# Patient Record
Sex: Female | Born: 1965 | Race: Black or African American | Hispanic: No | Marital: Married | Smoking: Current every day smoker
Health system: Southern US, Community
[De-identification: ages and names within clinical notes are randomized; demographics above are authoritative.]

## PROBLEM LIST (undated history)

## (undated) DIAGNOSIS — K219 Gastro-esophageal reflux disease without esophagitis: Secondary | ICD-10-CM

## (undated) DIAGNOSIS — R87629 Unspecified abnormal cytological findings in specimens from vagina: Secondary | ICD-10-CM

## (undated) DIAGNOSIS — I10 Essential (primary) hypertension: Secondary | ICD-10-CM

## (undated) HISTORY — PX: TONSILLECTOMY: SUR1361

## (undated) HISTORY — DX: Gastro-esophageal reflux disease without esophagitis: K21.9

## (undated) HISTORY — DX: Unspecified abnormal cytological findings in specimens from vagina: R87.629

## (undated) HISTORY — PX: EYE SURGERY: SHX253

---

## 2002-03-30 ENCOUNTER — Encounter: Payer: Self-pay | Admitting: Emergency Medicine

## 2002-03-30 ENCOUNTER — Emergency Department (HOSPITAL_COMMUNITY): Admission: EM | Admit: 2002-03-30 | Discharge: 2002-03-30 | Payer: Self-pay | Admitting: Emergency Medicine

## 2002-07-13 ENCOUNTER — Emergency Department (HOSPITAL_COMMUNITY): Admission: EM | Admit: 2002-07-13 | Discharge: 2002-07-13 | Payer: Self-pay | Admitting: Emergency Medicine

## 2002-07-13 ENCOUNTER — Encounter: Payer: Self-pay | Admitting: Emergency Medicine

## 2002-08-04 ENCOUNTER — Other Ambulatory Visit: Admission: RE | Admit: 2002-08-04 | Discharge: 2002-08-04 | Payer: Self-pay | Admitting: Family Medicine

## 2004-09-22 ENCOUNTER — Emergency Department (HOSPITAL_COMMUNITY): Admission: EM | Admit: 2004-09-22 | Discharge: 2004-09-22 | Payer: Self-pay | Admitting: Emergency Medicine

## 2004-09-30 ENCOUNTER — Emergency Department (HOSPITAL_COMMUNITY): Admission: EM | Admit: 2004-09-30 | Discharge: 2004-09-30 | Payer: Self-pay

## 2006-11-09 ENCOUNTER — Emergency Department (HOSPITAL_COMMUNITY): Admission: EM | Admit: 2006-11-09 | Discharge: 2006-11-09 | Payer: Self-pay | Admitting: Emergency Medicine

## 2006-11-14 ENCOUNTER — Ambulatory Visit (HOSPITAL_COMMUNITY): Admission: RE | Admit: 2006-11-14 | Discharge: 2006-11-14 | Payer: Self-pay | Admitting: Otolaryngology

## 2010-04-01 ENCOUNTER — Encounter: Admission: RE | Admit: 2010-04-01 | Discharge: 2010-04-01 | Payer: Self-pay | Admitting: Occupational Medicine

## 2010-06-15 ENCOUNTER — Emergency Department (HOSPITAL_COMMUNITY)
Admission: EM | Admit: 2010-06-15 | Discharge: 2010-06-15 | Payer: Self-pay | Source: Home / Self Care | Admitting: Emergency Medicine

## 2011-01-13 NOTE — Op Note (Signed)
[name], [name]            ACCOUNT NO.:  [id number]   MEDICAL RECORD NO.:  [id number]          PATIENT TYPE:  AMB   LOCATION:  SDS                          FACILITY:  [facility]   PHYSICIAN:  Suzanna Obey, M.D.       DATE OF BIRTH:  10/15/1965   DATE OF PROCEDURE:  11/14/2006  DATE OF DISCHARGE:  11/14/2006                               OPERATIVE REPORT   PREOPERATIVE DIAGNOSIS:  Left orbital floor fracture.   POSTOPERATIVE DIAGNOSIS:  Left orbital floor fracture.   PROCEDURE:  Open reduction internal fixation of left orbital floor  fracture.   ANESTHESIA:  General.   ESTIMATED BLOOD LOSS:  Approximately 5 mL.   INDICATIONS:  This is a 45 year old who injured her eye about 1 week ago  and sustained an orbital floor fracture based on CT scan.  She saw the  ophthalmologist yesterday and there was entrapment and diplopia issues.  She has no new vision changes.  She was informed of the risks and  benefits of the procedure including bleeding, infection, blindness,  scarring of the eye and conjunctiva, persistent diplopia revision  orbital floor necessary, chronic sinusitis and persistent numbness and  risk of the anesthetic.  All questions were answered and consent was  obtained.   DESCRIPTION OF PROCEDURE:  The patient was taken to the operating room  and placed in the supine position.  After adequate general endotracheal  tube anesthesia, was prepped and draped in the usual standard manner.  The lateral canthal area was injected with 1% lidocaine with 1:100,000  epinephrine as well as along the infraorbital rim. An incision was made  just lateral to the lateral canthus with a 15 blade and was dissected  down to the orbital rim.  The lateral canthus was divided with sharp  division and the conjunctiva was dissected so the orbital fat was kept  up into the orbital along the periorbita and the conjunctiva was cut  exposing the infraorbital rim.  This flap was laid back down and  eyelid  retractor was used to gain exposure.  The Glorious Peach was used to dissect into  the orbit and immediately it could be identified.  A significant amount  of fat down into the maxillary sinus that was trapped.  This was brought  back up with the [facility] elevator carefully, slowly dissecting the fat  back up into the orbit.  The bone that was displaced could be seen and  it was brought back into its anatomical position.  There was nice bone  on each side of this orbital floor fracture and the dissection was only  carried back to the level of where the fat seemed to be entrapped.  This  was approximately half-way back in the orbit.  There was a well-defined  hole so the dissolvable mesh was used to position it to the orbit to  cover over the blowout.  This fit very nicely and was confirmed to the  orbital shape.  The fat was laid back down on top of this and the eye  was repositioned.  A corneal protector had been placed prior to  the  onset of the case, and it was removed.  The periosteum was  reapproximated and secured with 4-0 chromic.  The lateral canthus was  repositioned using a 5-0 nylon suture and made careful that it was  precisely  reapproximated.  The skin incision was closed with interrupted 4-0  chromic and an interrupted 4-0 nylon.  Bacitracin ointment placed in the  eye and washed with balanced salt solution.  The patient was awakened,  brought to the recovery room in stable condition.  Counts were correct.           ______________________________  Suzanna Obey, M.D.     JB/MEDQ  D:  11/14/2006  T:  11/15/2006  Job:  [id number]

## 2011-01-31 ENCOUNTER — Emergency Department (HOSPITAL_COMMUNITY)
Admission: EM | Admit: 2011-01-31 | Discharge: 2011-01-31 | Disposition: A | Discharge status: Home / Self Care | Payer: Self-pay | Attending: Emergency Medicine | Admitting: Emergency Medicine

## 2011-01-31 DIAGNOSIS — L259 Unspecified contact dermatitis, unspecified cause: Secondary | ICD-10-CM | POA: Insufficient documentation

## 2011-04-02 ENCOUNTER — Encounter: Payer: Self-pay | Admitting: Emergency Medicine

## 2011-04-02 ENCOUNTER — Emergency Department (HOSPITAL_COMMUNITY)
Admission: EM | Admit: 2011-04-02 | Discharge: 2011-04-02 | Disposition: A | Discharge status: Home / Self Care | Payer: Self-pay | Attending: Emergency Medicine | Admitting: Emergency Medicine

## 2011-04-02 DIAGNOSIS — T6391XA Toxic effect of contact with unspecified venomous animal, accidental (unintentional), initial encounter: Secondary | ICD-10-CM | POA: Insufficient documentation

## 2011-04-02 DIAGNOSIS — L02419 Cutaneous abscess of limb, unspecified: Secondary | ICD-10-CM | POA: Insufficient documentation

## 2011-04-02 DIAGNOSIS — T63391A Toxic effect of venom of other spider, accidental (unintentional), initial encounter: Secondary | ICD-10-CM | POA: Insufficient documentation

## 2011-04-02 DIAGNOSIS — L259 Unspecified contact dermatitis, unspecified cause: Secondary | ICD-10-CM | POA: Insufficient documentation

## 2011-04-02 DIAGNOSIS — W57XXXA Bitten or stung by nonvenomous insect and other nonvenomous arthropods, initial encounter: Secondary | ICD-10-CM

## 2011-04-02 DIAGNOSIS — L03119 Cellulitis of unspecified part of limb: Secondary | ICD-10-CM | POA: Insufficient documentation

## 2011-04-02 MED ORDER — DOXYCYCLINE HYCLATE 100 MG PO TABS
100.0000 mg | ORAL_TABLET | Freq: Once | ORAL | Status: AC
Start: 2011-04-02 — End: 2011-04-02
  Administered 2011-04-02: 100 mg via ORAL

## 2011-04-02 MED ORDER — BACITRACIN-NEOMYCIN-POLYMYXIN 400-5-5000 EX OINT
TOPICAL_OINTMENT | Freq: Once | CUTANEOUS | Status: AC
  Administered 2011-04-02: 1 via TOPICAL

## 2011-04-02 MED ORDER — DOXYCYCLINE HYCLATE 100 MG PO TABS: 100.0000 mg | ORAL_TABLET | Freq: Once | ORAL | Status: DC

## 2011-04-02 MED ORDER — BACITRACIN-NEOMYCIN-POLYMYXIN 400-5-5000 EX OINT
TOPICAL_OINTMENT | CUTANEOUS | Status: AC
  Filled 2011-04-02: qty 1

## 2011-04-02 MED ORDER — TRIAMCINOLONE ACETONIDE 0.1 % EX CREA: TOPICAL_CREAM | Freq: Two times a day (BID) | CUTANEOUS | Status: AC

## 2011-04-02 MED ORDER — DOXYCYCLINE HYCLATE 100 MG PO CAPS: 100.0000 mg | ORAL_CAPSULE | Freq: Two times a day (BID) | ORAL | Status: AC

## 2011-04-02 MED ORDER — DOXYCYCLINE HYCLATE 100 MG PO TABS
ORAL_TABLET | ORAL | Status: AC
  Filled 2011-04-02: qty 1

## 2011-04-02 NOTE — ED Notes (Signed)
Pt c/o  "spider bite" to r upper thigh. Round abscess noted.

## 2011-04-02 NOTE — ED Provider Notes (Addendum)
History     CSN: [id number] Arrival date & time: 04/02/2011  8:01 AM  Chief Complaint  Patient presents with  . Abscess   The history is provided by the patient (pt put on a pair of pants last PM and states "it felt like something bit me"). No language interpreter was used.    History reviewed. No pertinent past medical history.  Past Surgical History  Procedure Date  . Eye surgery     Family History  Problem Relation Age of Onset  . Hypertension Mother     History  Substance Use Topics  . Smoking status: Current Everyday Smoker -- 0.5 packs/day  . Smokeless tobacco: Not on file  . Alcohol Use: Yes     occassional    OB History    Grav Para Term Preterm Abortions TAB SAB Ect Mult Living   1 1        1       Review of Systems  Skin:       ? Insect bite  All other systems reviewed and are negative.    Physical Exam  BP 144/112  Pulse 78  Temp 98.3 F (36.8 C)  Resp 20  Ht 5\' 3"  (1.6 m)  Wt 135 lb (61.236 kg)  BMI 23.91 kg/m2  SpO2 100%  LMP 04/02/2011  Physical Exam  Nursing note and vitals reviewed. Constitutional: She is oriented to person, place, and time. Vital signs are normal. She appears well-developed and well-nourished. No distress.  HENT:  Head: Normocephalic and atraumatic.  Right Ear: External ear normal.  Left Ear: External ear normal.  Nose: Nose normal.  Mouth/Throat: No oropharyngeal exudate.  Eyes: Conjunctivae and EOM are normal. Pupils are equal, round, and reactive to light. Right eye exhibits no discharge. Left eye exhibits no discharge. No scleral icterus.  Neck: Normal range of motion. Neck supple. No JVD present. No tracheal deviation present. No thyromegaly present.  Cardiovascular: Normal rate, regular rhythm, normal heart sounds, intact distal pulses and normal pulses.  Exam reveals no gallop and no friction rub.   No murmur heard. Pulmonary/Chest: Effort normal and breath sounds normal. No stridor. No respiratory distress.  She has no wheezes. She has no rales. She exhibits no tenderness.  Abdominal: Soft. Normal appearance and bowel sounds are normal. She exhibits no distension and no mass. There is no tenderness. There is no rebound and no guarding.  Musculoskeletal: Normal range of motion. She exhibits no edema and no tenderness.  Lymphadenopathy:    She has no cervical adenopathy.  Neurological: She is alert and oriented to person, place, and time. She has normal reflexes. No cranial nerve deficit. Coordination normal. GCS eye subscore is 4. GCS verbal subscore is 5. GCS motor subscore is 6.  Reflex Scores:      Tricep reflexes are 2+ on the right side and 2+ on the left side.      Bicep reflexes are 2+ on the right side and 2+ on the left side.      Brachioradialis reflexes are 2+ on the right side and 2+ on the left side.      Patellar reflexes are 2+ on the right side and 2+ on the left side.      Achilles reflexes are 2+ on the right side and 2+ on the left side. Skin: Skin is warm and dry. No rash noted. She is not diaphoretic.     Psychiatric: She has a normal mood and affect. Her speech is normal  and behavior is normal. Judgment and thought content normal. Cognition and memory are normal.    ED Course  INCISION AND DRAINAGE Date/Time: 04/02/2011 8:56 AM Performed by: Worthy Rancher Authorized by: Pollyann Savoy Consent: Verbal consent obtained. Consent given by: patient Patient understanding: patient states understanding of the procedure being performed Patient identity confirmed: verbally with patient Type: bulla Body area: lower extremity Anesthesia method: none. Patient sedated: no Incision type: single straight Complexity: simple Drainage: serosanguinous Drainage amount: scant Wound treatment: wound left open Patient tolerance: Patient tolerated the procedure well with no immediate complications.    MDM       Worthy Rancher, PA 04/02/11 0830  Worthy Rancher,  PA 04/02/11 0840  Worthy Rancher, PA 04/02/11 1610  Worthy Rancher, PA 04/02/11 0845  Worthy Rancher, PA 04/02/11 9604  Worthy Rancher, PA 06/19/11 [phone]

## 2011-04-02 NOTE — ED Provider Notes (Signed)
Medical screening examination/treatment/procedure(s) were performed by non-physician practitioner and as supervising physician I was immediately available for consultation/collaboration.   Tamryn Popko B. Bernette Mayers, MD 04/02/11 [phone]

## 2011-04-02 NOTE — ED Provider Notes (Signed)
Medical screening examination/treatment/procedure(s) were performed by non-physician practitioner and as supervising physician I was immediately available for consultation/collaboration.   Valeriano Bain B. Bernette Mayers, MD 04/02/11 [phone]

## 2011-06-15 ENCOUNTER — Encounter (HOSPITAL_COMMUNITY): Payer: Self-pay | Admitting: Emergency Medicine

## 2011-06-15 ENCOUNTER — Emergency Department (HOSPITAL_COMMUNITY)
Admission: EM | Admit: 2011-06-15 | Discharge: 2011-06-15 | Disposition: A | Discharge status: Home / Self Care | Payer: Self-pay | Attending: Emergency Medicine | Admitting: Emergency Medicine

## 2011-06-15 DIAGNOSIS — F17200 Nicotine dependence, unspecified, uncomplicated: Secondary | ICD-10-CM | POA: Insufficient documentation

## 2011-06-15 DIAGNOSIS — J40 Bronchitis, not specified as acute or chronic: Secondary | ICD-10-CM | POA: Insufficient documentation

## 2011-06-15 MED ORDER — DOXYCYCLINE HYCLATE 100 MG PO CAPS: 100.0000 mg | ORAL_CAPSULE | Freq: Two times a day (BID) | ORAL | Status: AC

## 2011-06-15 MED ORDER — PROMETHAZINE-DM 6.25-15 MG/5ML PO SYRP: ORAL_SOLUTION | ORAL | Status: DC

## 2011-06-15 MED ORDER — FEXOFENADINE-PSEUDOEPHED ER 60-120 MG PO TB12: 1.0000 | ORAL_TABLET | Freq: Two times a day (BID) | ORAL | Status: DC

## 2011-06-15 MED ORDER — PREDNISONE 10 MG PO TABS: ORAL_TABLET | ORAL | Status: DC

## 2011-06-15 NOTE — ED Provider Notes (Addendum)
History     CSN: [id number] Arrival date & time: 06/15/2011 11:51 AM   None     Chief Complaint  Patient presents with  . Cough    (Consider location/radiation/quality/duration/timing/severity/associated sxs/prior treatment) Patient is a 45 y.o. female presenting with cough. The history is provided by the patient.  Cough This is a new problem. The current episode started more than 1 week ago. The problem has been gradually worsening. The cough is non-productive. There has been no fever. Associated symptoms include chest pain, sweats, headaches, rhinorrhea, sore throat, shortness of breath and wheezing. Pertinent negatives include no myalgias.    History reviewed. No pertinent past medical history.  Past Surgical History  Procedure Date  . Eye surgery     Family History  Problem Relation Age of Onset  . Hypertension Mother     History  Substance Use Topics  . Smoking status: Current Everyday Smoker -- 0.5 packs/day    Types: Cigarettes  . Smokeless tobacco: Not on file  . Alcohol Use: Yes     occassional    OB History    Grav Para Term Preterm Abortions TAB SAB Ect Mult Living   1 1        1       Review of Systems  Constitutional: Negative for activity change.       All ROS Neg except as noted in HPI  HENT: Positive for sore throat and rhinorrhea. Negative for nosebleeds and neck pain.   Eyes: Negative for photophobia and discharge.  Respiratory: Positive for cough, shortness of breath and wheezing.   Cardiovascular: Positive for chest pain. Negative for palpitations.  Gastrointestinal: Negative for abdominal pain and blood in stool.  Genitourinary: Negative for dysuria, frequency and hematuria.  Musculoskeletal: Negative for myalgias, back pain and arthralgias.  Skin: Negative.   Neurological: Positive for headaches. Negative for dizziness, seizures and speech difficulty.  Psychiatric/Behavioral: Negative for hallucinations and confusion.    Allergies    Review of patient's allergies indicates no known allergies.  Home Medications   Current Outpatient Rx  Name Route Sig Dispense Refill  . HYDROXYZINE PAMOATE 25 MG PO CAPS Oral Take 25 mg by mouth 3 (three) times daily as needed.      . TRIAMCINOLONE ACETONIDE 0.1 % EX CREA Topical Apply topically 2 (two) times daily. 30 g 0    BP 187/116  Pulse 91  Temp(Src) 98.6 F (37 C) (Oral)  Resp 14  Ht 5\' 3"  (1.6 m)  Wt 120 lb (54.432 kg)  BMI 21.26 kg/m2  SpO2 100%  LMP 05/30/2011  Physical Exam  Nursing note and vitals reviewed. Constitutional: She is oriented to person, place, and time. She appears well-developed and well-nourished.  Non-toxic appearance.  HENT:  Head: Normocephalic.  Right Ear: Tympanic membrane and external ear normal.  Left Ear: Tympanic membrane and external ear normal.       Nasal congestion noted.  Eyes: EOM and lids are normal. Pupils are equal, round, and reactive to light.  Neck: Normal range of motion. Neck supple. Carotid bruit is not present.  Cardiovascular: Normal rate, regular rhythm, normal heart sounds, intact distal pulses and normal pulses.   Pulmonary/Chest: No respiratory distress. She has wheezes. She has rhonchi.  Abdominal: Soft. Bowel sounds are normal. There is no tenderness. There is no guarding.  Musculoskeletal: Normal range of motion.  Lymphadenopathy:       Head (right side): No submandibular adenopathy present.       Head (left  side): No submandibular adenopathy present.    She has no cervical adenopathy.  Neurological: She is alert and oriented to person, place, and time. She has normal strength. No cranial nerve deficit or sensory deficit.  Skin: Skin is warm and dry.  Psychiatric: She has a normal mood and affect. Her speech is normal.    ED Course  Procedures (including critical care time)  Labs Reviewed - No data to display No results found.   Dx: bronchitis   MDM  I have reviewed nursing notes, vital signs, and  all appropriate lab and imaging results for this patient.        Kathie Dike, PA 06/29/11 2123  Kathie Dike, PA 07/07/11 [phone]

## 2011-06-15 NOTE — ED Notes (Signed)
Pt c/o cough for 3 weeks.

## 2011-06-21 NOTE — ED Provider Notes (Signed)
Medical screening examination/treatment/procedure(s) were performed by non-physician practitioner and as supervising physician I was immediately available for consultation/collaboration.   Jamey Harman B. Mcarthur Ivins, MD 06/21/11 0836 

## 2011-06-21 NOTE — ED Provider Notes (Signed)
  Physical Exam  BP 187/116  Pulse 91  Temp(Src) 98.6 F (37 C) (Oral)  Resp 14  Ht 5\' 3"  (1.6 m)  Wt 120 lb (54.432 kg)  BMI 21.26 kg/m2  SpO2 100%  LMP 05/30/2011  Physical Exam  ED Course  Procedures  MDM Medical screening examination/treatment/procedure(s) were conducted as a shared visit with non-physician practitioner(s) and myself.  I personally evaluated the patient during the encounter      Raeford Razor, MD 06/21/11 [phone]

## 2011-07-03 NOTE — ED Provider Notes (Signed)
Medical screening examination/treatment/procedure(s) were performed by non-physician practitioner and as supervising physician I was immediately available for consultation/collaboration.  Tariah Transue, MD 07/03/11 0014 

## 2011-07-07 NOTE — ED Provider Notes (Signed)
Medical screening examination/treatment/procedure(s) were performed by non-physician practitioner and as supervising physician I was immediately available for consultation/collaboration.  Raeford Razor, MD 07/07/11 [phone]

## 2012-05-06 ENCOUNTER — Emergency Department (HOSPITAL_COMMUNITY)
Admission: EM | Admit: 2012-05-06 | Discharge: 2012-05-06 | Disposition: A | Discharge status: Home / Self Care | Payer: Self-pay | Attending: Emergency Medicine | Admitting: Emergency Medicine

## 2012-05-06 ENCOUNTER — Encounter (HOSPITAL_COMMUNITY): Payer: Self-pay | Admitting: *Deleted

## 2012-05-06 DIAGNOSIS — R42 Dizziness and giddiness: Secondary | ICD-10-CM | POA: Insufficient documentation

## 2012-05-06 DIAGNOSIS — F17200 Nicotine dependence, unspecified, uncomplicated: Secondary | ICD-10-CM | POA: Insufficient documentation

## 2012-05-06 DIAGNOSIS — I10 Essential (primary) hypertension: Secondary | ICD-10-CM | POA: Insufficient documentation

## 2012-05-06 HISTORY — DX: Essential (primary) hypertension: I10

## 2012-05-06 MED ORDER — MECLIZINE HCL 25 MG PO TABS: 25.0000 mg | ORAL_TABLET | Freq: Four times a day (QID) | ORAL | Status: AC

## 2012-05-06 NOTE — ED Notes (Signed)
Has had dizziness intermittently for 2 mos, worse today, Neck feels "stiff"

## 2012-05-06 NOTE — ED Notes (Signed)
Pt c/o intermittent dizziness x2 months which she states is getting worse. Pt also c/o neck pain/cramping. Pt denies chest/abdominal pain and headache as well as NVD. Pt states she feels "a little" weak and sore.

## 2012-05-06 NOTE — ED Provider Notes (Signed)
History     CSN: [id number]  Arrival date & time 05/06/12  2009   First MD Initiated Contact with Patient 05/06/12 2134      Chief Complaint  Patient presents with  . Dizziness     Patient is a 46 y.o. female presenting with neurologic complaint. The history is provided by the patient.  Neurologic Problem Primary symptoms do not include headaches, syncope, loss of consciousness, altered mental status, seizures, visual change, paresthesias, focal weakness, loss of sensation, speech change, memory loss, fever, nausea or vomiting. Primary symptoms comment: vertigo The symptoms began yesterday. Episode duration: several seconds. The symptoms are worsening. Context: head movement.  Additional symptoms include vertigo. Additional symptoms do not include neck stiffness, nystagmus, hearing loss or tinnitus.  pt reports episodes of vertigo (world spinning) for past day She reports she will occasionally have brief episodes (nurse documented dizziness for two months) but actually only had significant symptoms for past 24 hours  No headache.  No visual loss.  No cp/sob.  No fever No focal weakness.  No falls.  She can ambulate.  No h/o CVA.    Past Medical History  Diagnosis Date  . Hypertension     Past Surgical History  Procedure Date  . Eye surgery   . Tonsillectomy     Family History  Problem Relation Age of Onset  . Hypertension Mother     History  Substance Use Topics  . Smoking status: Current Everyday Smoker -- 0.5 packs/day    Types: Cigarettes  . Smokeless tobacco: Not on file  . Alcohol Use: Yes     occassional    OB History    Grav Para Term Preterm Abortions TAB SAB Ect Mult Living   1 1        1       Review of Systems  Constitutional: Negative for fever.  HENT: Negative for hearing loss, neck stiffness and tinnitus.   Cardiovascular: Negative for syncope.  Gastrointestinal: Negative for nausea and vomiting.  Neurological: Positive for vertigo. Negative for  speech change, focal weakness, seizures, loss of consciousness, headaches and paresthesias.  Psychiatric/Behavioral: Negative for memory loss and altered mental status.  All other systems reviewed and are negative.    Allergies  Review of patient's allergies indicates no known allergies.  Home Medications   Current Outpatient Rx  Name Route Sig Dispense Refill  . ACETAMINOPHEN 500 MG PO TABS Oral Take 1,000 mg by mouth once a week. As needed for pai    . MECLIZINE HCL 25 MG PO TABS Oral Take 1 tablet (25 mg total) by mouth 4 (four) times daily. 28 tablet 0    BP 148/100  Pulse 90  Temp 98.5 F (36.9 C) (Oral)  Resp 18  Ht 5\' 3"  (1.6 m)  Wt 125 lb (56.7 kg)  BMI 22.14 kg/m2  SpO2 100%  LMP 05/02/2012  Physical Exam CONSTITUTIONAL: Well developed/well nourished HEAD AND FACE: Normocephalic/atraumatic EYES: EOMI/PERRL, no nystagmus.  No papilledema  ENMT: Mucous membranes moist.  Right TM normal.  Left TM obscured by cerumen NECK: supple no meningeal signs SPINE:entire spine nontender CV: S1/S2 noted, no murmurs/rubs/gallops noted LUNGS: Lungs are clear to auscultation bilaterally, no apparent distress ABDOMEN: soft, nontender, no rebound or guarding GU:no cva tenderness NEURO: Awake/alert, facies symmetric, no arm or leg drift is noted Cranial nerves 3/4/5/6/03/05/09/11/12 tested and intact Gait normal - no ataxia No past pointing EXTREMITIES: pulses normal, full ROM SKIN: warm, color normal PSYCH: no abnormalities of mood  noted   ED Course  Procedures     1. Vertigo     Pt reports brief episodes of vertigo that are positional.  She is well appearing.  Her gait is normal and no ataxia Doubt CVA.  Advised f/u for her BP Pt agreeable.  We discussed strict return precautions.  Advised she should not go to work if her symptoms return  MDM  Nursing notes including past medical history and social history reviewed and considered in  documentation         Joya Gaskins, MD 05/06/12 2321

## 2012-12-22 ENCOUNTER — Emergency Department (HOSPITAL_COMMUNITY): Payer: No Typology Code available for payment source

## 2012-12-22 ENCOUNTER — Encounter (HOSPITAL_COMMUNITY): Payer: Self-pay | Admitting: *Deleted

## 2012-12-22 ENCOUNTER — Emergency Department (HOSPITAL_COMMUNITY)
Admission: EM | Admit: 2012-12-22 | Discharge: 2012-12-22 | Disposition: A | Discharge status: Home / Self Care | Payer: No Typology Code available for payment source | Attending: Emergency Medicine | Admitting: Emergency Medicine

## 2012-12-22 DIAGNOSIS — S91209A Unspecified open wound of unspecified toe(s) with damage to nail, initial encounter: Secondary | ICD-10-CM

## 2012-12-22 DIAGNOSIS — I10 Essential (primary) hypertension: Secondary | ICD-10-CM | POA: Insufficient documentation

## 2012-12-22 DIAGNOSIS — Y9229 Other specified public building as the place of occurrence of the external cause: Secondary | ICD-10-CM | POA: Insufficient documentation

## 2012-12-22 DIAGNOSIS — IMO0002 Reserved for concepts with insufficient information to code with codable children: Secondary | ICD-10-CM | POA: Insufficient documentation

## 2012-12-22 DIAGNOSIS — F17200 Nicotine dependence, unspecified, uncomplicated: Secondary | ICD-10-CM | POA: Insufficient documentation

## 2012-12-22 DIAGNOSIS — Y9389 Activity, other specified: Secondary | ICD-10-CM | POA: Insufficient documentation

## 2012-12-22 DIAGNOSIS — S91109A Unspecified open wound of unspecified toe(s) without damage to nail, initial encounter: Secondary | ICD-10-CM | POA: Insufficient documentation

## 2012-12-22 MED ORDER — BUPIVACAINE HCL (PF) 0.5 % IJ SOLN
INTRAMUSCULAR | Status: AC
  Filled 2012-12-22: qty 30

## 2012-12-22 MED ORDER — LIDOCAINE HCL (PF) 2 % IJ SOLN
2.0000 mL | Freq: Once | INTRAMUSCULAR | Status: AC
  Administered 2012-12-22: 2 mL
  Filled 2012-12-22: qty 10

## 2012-12-22 MED ORDER — HYDROCODONE-ACETAMINOPHEN 5-325 MG PO TABS
1.0000 | ORAL_TABLET | Freq: Once | ORAL | Status: AC
  Administered 2012-12-22: 1 via ORAL
  Filled 2012-12-22: qty 1

## 2012-12-22 MED ORDER — HYDROCODONE-ACETAMINOPHEN 5-325 MG PO TABS: 1.0000 | ORAL_TABLET | ORAL | Status: DC | PRN

## 2012-12-22 NOTE — ED Notes (Signed)
nad noted prior to dc. Dc instructions reviewed and explained. 1 script given to pt. Post op shoe applied. Ambulated out without difficulty.

## 2012-12-22 NOTE — ED Notes (Signed)
Pt states that she tripped over a rug in [employer], pulling left toenail loose. Pt c/o pain to left foot and toes area.

## 2012-12-26 NOTE — ED Provider Notes (Signed)
History     CSN: [id number]  Arrival date & time 12/22/12  1614   First MD Initiated Contact with Patient 12/22/12 1634      Chief Complaint  Patient presents with  . Toe Injury    (Consider location/radiation/quality/duration/timing/severity/associated sxs/prior treatment) HPI Comments: [name] is a 47 y.o. Female who tripped over a rug at a local grocery store,  Injuring her left toenail,  Which has torn away from the nail bed.  The injury occurred prior to arrival.  Pain is sharp, constant and does not radiate.  Walking and palpation makes worse, rest improves the pain.  She denies any other injury sustained in her fall.     The history is provided by the patient.    Past Medical History  Diagnosis Date  . Hypertension     Past Surgical History  Procedure Laterality Date  . Eye surgery    . Tonsillectomy      Family History  Problem Relation Age of Onset  . Hypertension Mother     History  Substance Use Topics  . Smoking status: Current Every Day Smoker -- 0.50 packs/day    Types: Cigarettes  . Smokeless tobacco: Not on file  . Alcohol Use: Yes     Comment: occassional    OB History   Grav Para Term Preterm Abortions TAB SAB Ect Mult Living   1 1        1       Review of Systems  Constitutional: Negative for fever.  Musculoskeletal: Positive for arthralgias. Negative for myalgias and joint swelling.  Neurological: Negative for weakness and numbness.    Allergies  Review of patient's allergies indicates no known allergies.  Home Medications   Current Outpatient Rx  Name  Route  Sig  Dispense  Refill  . acetaminophen (PAIN RELIEF) 500 MG tablet   Oral   Take 1,000 mg by mouth once a week. As needed for pain         . HYDROcodone-acetaminophen (NORCO/VICODIN) 5-325 MG per tablet   Oral   Take 1 tablet by mouth every 6 (six) hours as needed for pain.         Marland Kitchen HYDROcodone-acetaminophen (NORCO/VICODIN) 5-325 MG per tablet    Oral   Take 1-2 tablets by mouth every 4 (four) hours as needed for pain.   30 tablet   0     BP 130/101  Pulse 79  Temp(Src) 98 F (36.7 C) (Oral)  Resp 20  Ht 5\' 3"  (1.6 m)  Wt 127 lb (57.607 kg)  BMI 22.5 kg/m2  SpO2 100%  LMP 11/21/2012  Physical Exam  Constitutional: She appears well-developed and well-nourished.  HENT:  Head: Atraumatic.  Neck: Normal range of motion.  Cardiovascular:  Pulses equal bilaterally  Musculoskeletal: She exhibits tenderness.  Tenderness at left great toe with no deformity to the toe,  But the nail plate is raised to the proximal cuticle.  Hemostatic.  Distal sensation intact.  Neurological: She is alert. She has normal strength. She displays normal reflexes. No sensory deficit.  Equal strength  Skin: Skin is warm and dry.  Psychiatric: She has a normal mood and affect.    ED Course  toenail removal Date/Time: 12/22/2012 5:20 PM Performed by: Burgess Amor Authorized by: Burgess Amor Consent: Verbal consent obtained. Risks and benefits: risks, benefits and alternatives were discussed Consent given by: patient Preparation: Patient was prepped and draped in the usual sterile fashion. Local anesthesia used: yes Anesthesia:  digital block Local anesthetic: lidocaine 2% without epinephrine Anesthetic total: 5 ml Patient tolerance: Patient tolerated the procedure well with no immediate complications. Comments: Pt did not receive adequate anesthesia with the lidocaine.  Dr. Blinda Leatherwood added bupivocaine with improved pain tolerance.  Nail plate removed easilty, pt tolerated well.  No nail bed injury seen.   (including critical care time)  Labs Reviewed - No data to display No results found.   1. Toenail avulsion, initial encounter       MDM  Pt encouraged ice,  Elevation,  Prescribed hydrocodone for pain relief.  Bulky dressing and post op shoe provided.  Prn f/u anticipated.    Patients labs and/or radiological studies were viewed and  considered during the medical decision making and disposition process.         Burgess Amor, PA-C 12/26/12 2232

## 2012-12-31 NOTE — ED Provider Notes (Signed)
Medical screening examination/treatment/procedure(s) were performed by non-physician practitioner and as supervising physician I was immediately available for consultation/collaboration.    Christopher J. Pollina, MD 12/31/12 0659 

## 2013-06-26 ENCOUNTER — Emergency Department (HOSPITAL_COMMUNITY): Payer: BC Managed Care – PPO

## 2013-06-26 ENCOUNTER — Encounter (HOSPITAL_COMMUNITY): Payer: Self-pay | Admitting: Emergency Medicine

## 2013-06-26 ENCOUNTER — Emergency Department (HOSPITAL_COMMUNITY)
Admission: EM | Admit: 2013-06-26 | Discharge: 2013-06-26 | Disposition: A | Discharge status: Home / Self Care | Payer: BC Managed Care – PPO | Attending: Emergency Medicine | Admitting: Emergency Medicine

## 2013-06-26 DIAGNOSIS — M778 Other enthesopathies, not elsewhere classified: Secondary | ICD-10-CM

## 2013-06-26 DIAGNOSIS — M65839 Other synovitis and tenosynovitis, unspecified forearm: Secondary | ICD-10-CM | POA: Insufficient documentation

## 2013-06-26 DIAGNOSIS — I10 Essential (primary) hypertension: Secondary | ICD-10-CM | POA: Insufficient documentation

## 2013-06-26 DIAGNOSIS — F17200 Nicotine dependence, unspecified, uncomplicated: Secondary | ICD-10-CM | POA: Insufficient documentation

## 2013-06-26 MED ORDER — MELOXICAM 7.5 MG PO TABS: 7.5000 mg | ORAL_TABLET | Freq: Every day | ORAL | Status: DC

## 2013-06-26 NOTE — ED Provider Notes (Signed)
CSN: [id number]     Arrival date & time 06/26/13  1105 History   First MD Initiated Contact with Patient 06/26/13 1122     Chief Complaint  Patient presents with  . Wrist Pain  . Hand Pain   (Consider location/radiation/quality/duration/timing/severity/associated sxs/prior Treatment) Patient is a 47 y.o. female presenting with wrist pain and hand pain. The history is provided by the patient.  Wrist Pain This is a new problem. The current episode started yesterday. The problem occurs constantly. The problem has been unchanged. Exacerbated by: movement of wrist. She has tried nothing for the symptoms.  Hand Pain  [name] is a 47 y.o. female who presents to the ED with left wrist pain. She does not remember any injury to the area but does repetitive hand movement at work. The pain is located in the right wrist and radiates to the index finger. She has noted swelling to her area. No other problems today.    Past Medical History  Diagnosis Date  . Hypertension    Past Surgical History  Procedure Laterality Date  . Eye surgery    . Tonsillectomy     Family History  Problem Relation Age of Onset  . Hypertension Mother    History  Substance Use Topics  . Smoking status: Current Every Day Smoker -- 0.50 packs/day    Types: Cigarettes  . Smokeless tobacco: Not on file  . Alcohol Use: Yes     Comment: occassional   OB History   Grav Para Term Preterm Abortions TAB SAB Ect Mult Living   1 1        1      Review of Systems Negative review of systems except as noted in HPI  Allergies  Review of patient's allergies indicates no known allergies.  Home Medications   Current Outpatient Rx  Name  Route  Sig  Dispense  Refill  . diphenhydrAMINE (BENADRYL) 25 mg capsule   Oral   Take 25 mg by mouth every 6 (six) hours as needed for allergies.         Marland Kitchen HYDROcodone-acetaminophen (NORCO/VICODIN) 5-325 MG per tablet   Oral   Take 1 tablet by mouth every 6 (six) hours as  needed for pain.          BP 124/90  Pulse 80  Temp(Src) 97.9 F (36.6 C) (Oral)  Resp 14  Ht 5\' 3"  (1.6 m)  Wt 135 lb (61.236 kg)  BMI 23.92 kg/m2  SpO2 98%  LMP 04/21/2013 Physical Exam  Nursing note and vitals reviewed. Constitutional: She is oriented to person, place, and time. She appears well-developed and well-nourished. No distress.  HENT:  Head: Normocephalic and atraumatic.  Eyes: EOM are normal.  Neck: Normal range of motion. Neck supple.  Cardiovascular: Normal rate.   Pulmonary/Chest: Effort normal.  Musculoskeletal:       Right hand: She exhibits tenderness and swelling (minimal). She exhibits normal capillary refill, no deformity and no laceration. Normal sensation noted. Normal strength noted.       Hands: Radial pulse strong, adequate circulation, good touch sensation.   Neurological: She is alert and oriented to person, place, and time. No cranial nerve deficit.  Skin: Skin is warm and dry.  Psychiatric: She has a normal mood and affect. Her behavior is normal.    ED Course  Procedures  Dg Wrist Complete Left  06/26/2013   CLINICAL DATA:  Left wrist pain  EXAM: LEFT WRIST - COMPLETE 3+ VIEW  COMPARISON:  None.  FINDINGS: There is no evidence of fracture or dislocation. There is no evidence of arthropathy or other focal bone abnormality. Soft tissues are unremarkable.  IMPRESSION: No acute abnormality noted.   Electronically Signed   By: Alcide Clever M.D.   On: 06/26/2013 12:08    MDM  47 y.o. female with tendonitis of the left wrist with radiation to the index finger. Hx of repetitive motion with hands at work.   Wrist splint applied. Will treat with NSAIDS and referral to orto. Patient to return here as needed.  I have reviewed this patient's vital signs, nurses notes, appropriate imaging and discussed findings and plan of care with the patient. She voices understanding.       Medication List    TAKE these medications       meloxicam 7.5 MG tablet    Commonly known as:  MOBIC  Take 1 tablet (7.5 mg total) by mouth daily.      ASK your doctor about these medications       diphenhydrAMINE 25 mg capsule  Commonly known as:  BENADRYL  Take 25 mg by mouth every 6 (six) hours as needed for allergies.     HYDROcodone-acetaminophen 5-325 MG per tablet  Commonly known as:  NORCO/VICODIN  Take 1 tablet by mouth every 6 (six) hours as needed for pain.           [facility] Orlene Och, NP 06/26/13 1710

## 2013-06-26 NOTE — ED Notes (Signed)
Patient with no complaints at this time. Respirations even and unlabored. Skin warm/dry. Discharge instructions reviewed with patient at this time. Patient given opportunity to voice concerns/ask questions. Patient discharged at this time and left Emergency Department with steady gait.   

## 2013-06-26 NOTE — ED Notes (Signed)
L hand and wrist pain w/swelling noticed yesterday before 0700.  Worsened over the day.  Took Hydrocodone last night, woke this morning with continued pain and worsened swelling. NKI. Pain worse on eversion and extension of wrist than with inversion and flexsion.  Tender from 3rd finger knuckle to wrist and over to thumb with palpation.

## 2013-06-27 NOTE — ED Provider Notes (Signed)
Medical screening examination/treatment/procedure(s) were performed by non-physician practitioner and as supervising physician I was immediately available for consultation/collaboration.    Vida Roller, MD 06/27/13 [phone]

## 2013-10-26 ENCOUNTER — Emergency Department (HOSPITAL_COMMUNITY)
Admission: EM | Admit: 2013-10-26 | Discharge: 2013-10-26 | Disposition: A | Discharge status: Home / Self Care | Payer: BC Managed Care – PPO | Attending: Emergency Medicine | Admitting: Emergency Medicine

## 2013-10-26 ENCOUNTER — Emergency Department (HOSPITAL_COMMUNITY): Payer: BC Managed Care – PPO

## 2013-10-26 ENCOUNTER — Encounter (HOSPITAL_COMMUNITY): Payer: Self-pay | Admitting: Emergency Medicine

## 2013-10-26 DIAGNOSIS — A599 Trichomoniasis, unspecified: Secondary | ICD-10-CM | POA: Insufficient documentation

## 2013-10-26 DIAGNOSIS — F17200 Nicotine dependence, unspecified, uncomplicated: Secondary | ICD-10-CM | POA: Insufficient documentation

## 2013-10-26 DIAGNOSIS — Z791 Long term (current) use of non-steroidal anti-inflammatories (NSAID): Secondary | ICD-10-CM | POA: Insufficient documentation

## 2013-10-26 DIAGNOSIS — Z3202 Encounter for pregnancy test, result negative: Secondary | ICD-10-CM | POA: Insufficient documentation

## 2013-10-26 DIAGNOSIS — I10 Essential (primary) hypertension: Secondary | ICD-10-CM | POA: Insufficient documentation

## 2013-10-26 DIAGNOSIS — R748 Abnormal levels of other serum enzymes: Secondary | ICD-10-CM | POA: Insufficient documentation

## 2013-10-26 DIAGNOSIS — R1013 Epigastric pain: Secondary | ICD-10-CM | POA: Insufficient documentation

## 2013-10-26 LAB — COMPREHENSIVE METABOLIC PANEL
ALK PHOS: 49 U/L (ref 39–117)
ALT: 12 U/L (ref 0–35)
AST: 18 U/L (ref 0–37)
Albumin: 3.6 g/dL (ref 3.5–5.2)
BILIRUBIN TOTAL: 0.2 mg/dL — AB (ref 0.3–1.2)
BUN: 13 mg/dL (ref 6–23)
CHLORIDE: 99 meq/L (ref 96–112)
CO2: 28 mEq/L (ref 19–32)
CREATININE: 0.73 mg/dL (ref 0.50–1.10)
Calcium: 9.2 mg/dL (ref 8.4–10.5)
GFR calc Af Amer: >90 mL/min (ref >90)
GFR calc non Af Amer: >90 mL/min (ref >90)
Glucose, Bld: 102 mg/dL — ABNORMAL HIGH (ref 70–99)
Potassium: 4 mEq/L (ref 3.7–5.3)
Sodium: 136 mEq/L — ABNORMAL LOW (ref 137–147)
TOTAL PROTEIN: 7.3 g/dL (ref 6.0–8.3)

## 2013-10-26 LAB — URINE MICROSCOPIC-ADD ON

## 2013-10-26 LAB — CBC WITH DIFFERENTIAL/PLATELET
BASOS PCT: 0 % (ref 0–1)
Basophils Absolute: 0 10*3/uL (ref 0.0–0.1)
EOS ABS: 0.1 10*3/uL (ref 0.0–0.7)
Eosinophils Relative: 1 % (ref 0–5)
HEMATOCRIT: 37.8 % (ref 36.0–46.0)
Hemoglobin: 12.7 g/dL (ref 12.0–15.0)
LYMPHS ABS: 2.2 10*3/uL (ref 0.7–4.0)
Lymphocytes Relative: 46 % (ref 12–46)
MCH: 26.8 pg (ref 26.0–34.0)
MCHC: 33.6 g/dL (ref 30.0–36.0)
MCV: 79.7 fL (ref 78.0–100.0)
MONO ABS: 0.3 10*3/uL (ref 0.1–1.0)
MONOS PCT: 7 % (ref 3–12)
NEUTROS PCT: 46 % (ref 43–77)
Neutro Abs: 2.2 10*3/uL (ref 1.7–7.7)
Platelets: 254 10*3/uL (ref 150–400)
RBC: 4.74 MIL/uL (ref 3.87–5.11)
RDW: 14.2 % (ref 11.5–15.5)
WBC: 4.8 10*3/uL (ref 4.0–10.5)

## 2013-10-26 LAB — URINALYSIS, ROUTINE W REFLEX MICROSCOPIC
BILIRUBIN URINE: NEGATIVE
GLUCOSE, UA: NEGATIVE mg/dL
Ketones, ur: NEGATIVE mg/dL
Nitrite: NEGATIVE
Protein, ur: NEGATIVE mg/dL
SPECIFIC GRAVITY, URINE: 1.015 (ref 1.005–1.030)
Urobilinogen, UA: 0.2 mg/dL (ref 0.0–1.0)
pH: 6.5 (ref 5.0–8.0)

## 2013-10-26 LAB — LIPASE, BLOOD: LIPASE: 64 U/L — AB (ref 11–59)

## 2013-10-26 LAB — PREGNANCY, URINE: PREG TEST UR: NEGATIVE

## 2013-10-26 MED ORDER — METRONIDAZOLE 500 MG PO TABS
2000.0000 mg | ORAL_TABLET | Freq: Once | ORAL | Status: AC
  Administered 2013-10-26: 2000 mg via ORAL
  Filled 2013-10-26: qty 4

## 2013-10-26 MED ORDER — GI COCKTAIL ~~LOC~~
30.0000 mL | Freq: Once | ORAL | Status: AC
  Administered 2013-10-26: 30 mL via ORAL
  Filled 2013-10-26: qty 30

## 2013-10-26 MED ORDER — ONDANSETRON HCL 4 MG PO TABS: 4.0000 mg | ORAL_TABLET | Freq: Four times a day (QID) | ORAL | Status: DC | PRN

## 2013-10-26 MED ORDER — OXYCODONE-ACETAMINOPHEN 5-325 MG PO TABS: 1.0000 | ORAL_TABLET | ORAL | Status: DC | PRN

## 2013-10-26 MED ORDER — PANTOPRAZOLE SODIUM 40 MG IV SOLR
40.0000 mg | Freq: Once | INTRAVENOUS | Status: AC
Start: 2013-10-26 — End: 2013-10-26
  Administered 2013-10-26: 40 mg via INTRAVENOUS
  Filled 2013-10-26: qty 40

## 2013-10-26 MED ORDER — PANTOPRAZOLE SODIUM 40 MG PO TBEC: 40.0000 mg | DELAYED_RELEASE_TABLET | Freq: Every day | ORAL | Status: DC

## 2013-10-26 NOTE — ED Notes (Signed)
Patient c/o generalized abdominal pain x 1 week with no nausea, vomiting.  Patient states has been having diarrhea.

## 2013-10-26 NOTE — Discharge Instructions (Signed)
Return to the ED if pain is not being adequately controlled at home. Your pain could be from an ulcer or from pancreatitis (inflamed pancreas). I am including information about both conditions.  Peptic Ulcer A peptic ulcer is a sore in the lining of in your esophagus (esophageal ulcer), stomach (gastric ulcer), or in the first part of your small intestine (duodenal ulcer). The ulcer causes erosion into the deeper tissue. CAUSES  Normally, the lining of the stomach and the small intestine protects itself from the acid that digests food. The protective lining can be damaged by:  An infection caused by a bacterium called Helicobacter pylori (H. pylori).  Regular use of nonsteroidal anti-inflammatory drugs (NSAIDs), such as ibuprofen or aspirin.  Smoking tobacco. Other risk factors include being older than 50, drinking alcohol excessively, and having a family history of ulcer disease.  SYMPTOMS   Burning pain or gnawing in the area between the chest and the belly button.  Heartburn.  Nausea and vomiting.  Bloating. The pain can be worse on an empty stomach and at night. If the ulcer results in bleeding, it can cause:  Black, tarry stools.  Vomiting of bright red blood.  Vomiting of coffee ground looking materials. DIAGNOSIS  A diagnosis is usually made based upon your history and an exam. Other tests and procedures may be performed to find the cause of the ulcer. Finding a cause will help determine the best treatment. Tests and procedures may include:  Blood tests, stool tests, or breath tests to check for the bacterium H. pylori.  An upper gastrointestinal (GI) series of the esophagus, stomach, and small intestine.  An endoscopy to examine the esophagus, stomach, and small intestine.  A biopsy. TREATMENT  Treatment may include:  Eliminating the cause of the ulcer, such as smoking, NSAIDs, or alcohol.  Medicines to reduce the amount of acid in your digestive  tract.  Antibiotic medicines if the ulcer is caused by the H. pylori bacterium.  An upper endoscopy to treat a bleeding ulcer.  Surgery if the bleeding is severe or if the ulcer created a hole somewhere in the digestive system. HOME CARE INSTRUCTIONS   Avoid tobacco, alcohol, and caffeine. Smoking can increase the acid in the stomach, and continued smoking will impair the healing of ulcers.  Avoid foods and drinks that seem to cause discomfort or aggravate your ulcer.  Only take medicines as directed by your caregiver. Do not substitute over-the-counter medicines for prescription medicines without talking to your caregiver.  Keep any follow-up appointments and tests as directed. SEEK MEDICAL CARE IF:   Your do not improve within 7 days of starting treatment.  You have ongoing indigestion or heartburn. SEEK IMMEDIATE MEDICAL CARE IF:   You have sudden, sharp, or persistent abdominal pain.  You have bloody or dark black, tarry stools.  You vomit blood or vomit that looks like coffee grounds.  You become light headed, weak, or feel faint.  You become sweaty or clammy. MAKE SURE YOU:   Understand these instructions.  Will watch your condition.  Will get help right away if you are not doing well or get worse. Document Released: 08/11/2000 Document Revised: 05/08/2012 Document Reviewed: 03/13/2012 [employer] Patient Information 2014 [place], [place].  Acute Pancreatitis Acute pancreatitis is a disease in which the pancreas becomes suddenly inflamed. The pancreas is a large gland located behind your stomach. The pancreas produces enzymes that help digest food. The pancreas also releases the hormones glucagon and insulin that help regulate blood  sugar. Damage to the pancreas occurs when the digestive enzymes from the pancreas are activated and begin attacking the pancreas before being released into the intestine. Most acute attacks last a couple of days and can cause serious  complications. Some people become dehydrated and develop low blood pressure. In severe cases, bleeding into the pancreas can lead to shock and can be life-threatening. The lungs, heart, and kidneys may fail. CAUSES  Pancreatitis can happen to anyone. In some cases, the cause is unknown. Most cases are caused by:  Alcohol abuse.  Gallstones. Other less common causes are:  Certain medicines.  Exposure to certain chemicals.  Infection.  Damage caused by an accident (trauma).  Abdominal surgery. SYMPTOMS   Pain in the upper abdomen that may radiate to the back.  Tenderness and swelling of the abdomen.  Nausea and vomiting. DIAGNOSIS  Your caregiver will perform a physical exam. Blood and stool tests may be done to confirm the diagnosis. Imaging tests may also be done, such as X-rays, CT scans, or an ultrasound of the abdomen. TREATMENT  Treatment usually requires a stay in the hospital. Treatment may include:  Pain medicine.  Fluid replacement through an intravenous line (IV).  Placing a tube in the stomach to remove stomach contents and control vomiting.  Not eating for 3 or 4 days. This gives your pancreas a rest, because enzymes are not being produced that can cause further damage.  Antibiotic medicines if your condition is caused by an infection.  Surgery of the pancreas or gallbladder. HOME CARE INSTRUCTIONS   Follow the diet advised by your caregiver. This may involve avoiding alcohol and decreasing the amount of fat in your diet.  Eat smaller, more frequent meals. This reduces the amount of digestive juices the pancreas produces.  Drink enough fluids to keep your urine clear or pale yellow.  Only take over-the-counter or prescription medicines as directed by your caregiver.  Avoid drinking alcohol if it caused your condition.  Do not smoke.  Get plenty of rest.  Check your blood sugar at home as directed by your caregiver.  Keep all follow-up appointments  as directed by your caregiver. SEEK MEDICAL CARE IF:   You do not recover as quickly as expected.  You develop new or worsening symptoms.  You have persistent pain, weakness, or nausea.  You recover and then have another episode of pain. SEEK IMMEDIATE MEDICAL CARE IF:   You are unable to eat or keep fluids down.  Your pain becomes severe.  You have a fever or persistent symptoms for more than 2 to 3 days.  You have a fever and your symptoms suddenly get worse.  Your skin or the white part of your eyes turn yellow (jaundice).  You develop vomiting.  You feel dizzy, or you faint.  Your blood sugar is high (over 300 mg/dL). MAKE SURE YOU:   Understand these instructions.  Will watch your condition.  Will get help right away if you are not doing well or get worse. Document Released: 08/14/2005 Document Revised: 02/13/2012 Document Reviewed: 11/23/2011 [facility] Patient Information 2014 [place].  Pantoprazole tablets What is this medicine? PANTOPRAZOLE (pan TOE pra zole) prevents the production of acid in the stomach. It is used to treat gastroesophageal reflux disease (GERD), inflammation of the esophagus, and Zollinger-Ellison syndrome. This medicine may be used for other purposes; ask your health care provider or pharmacist if you have questions. COMMON BRAND NAME(S): Protonix What should I tell my health care provider before  I take this medicine? They need to know if you have any of these conditions: -liver disease -low levels of magnesium in the blood -an unusual or allergic reaction to omeprazole, lansoprazole, pantoprazole, rabeprazole, other medicines, foods, dyes, or preservatives -pregnant or trying to get pregnant -breast-feeding How should I use this medicine? Take this medicine by mouth. Swallow the tablets whole with a drink of water. Follow the directions on the prescription label. Do not crush, break, or chew. Take your medicine at regular  intervals. Do not take your medicine more often than directed. Talk to your pediatrician regarding the use of this medicine in children. While this drug may be prescribed for children as young as 5 years for selected conditions, precautions do apply. Overdosage: If you think you have taken too much of this medicine contact a poison control center or emergency room at once. NOTE: This medicine is only for you. Do not share this medicine with others. What if I miss a dose? If you miss a dose, take it as soon as you can. If it is almost time for your next dose, take only that dose. Do not take double or extra doses. What may interact with this medicine? Do not take this medicine with any of the following medications: -atazanavir -nelfinavir This medicine may also interact with the following medications: -ampicillin -delavirdine -digoxin -diuretics -iron salts -medicines for fungal infections like ketoconazole, itraconazole and voriconazole -warfarin This list may not describe all possible interactions. Give your health care provider a list of all the medicines, herbs, non-prescription drugs, or dietary supplements you use. Also tell them if you smoke, drink alcohol, or use illegal drugs. Some items may interact with your medicine. What should I watch for while using this medicine? It can take several days before your stomach pain gets better. Check with your doctor or health care professional if your condition does not start to get better, or if it gets worse. You may need blood work done while you are taking this medicine. What side effects may I notice from receiving this medicine? Side effects that you should report to your doctor or health care professional as soon as possible: -allergic reactions like skin rash, itching or hives, swelling of the face, lips, or tongue -bone, muscle or joint pain -breathing problems -chest pain or chest tightness -dark yellow or brown  urine -dizziness -fast, irregular heartbeat -feeling faint or lightheaded -fever or sore throat -muscle spasm -palpitations -redness, blistering, peeling or loosening of the skin, including inside the mouth -seizures -tremors -unusual bleeding or bruising -unusually weak or tired -yellowing of the eyes or skin Side effects that usually do not require medical attention (Report these to your doctor or health care professional if they continue or are bothersome.): -constipation -diarrhea -dry mouth -headache -nausea This list may not describe all possible side effects. Call your doctor for medical advice about side effects. You may report side effects to FDA at 1-800-FDA-1088. Where should I keep my medicine? Keep out of the reach of children. Store at room temperature between 15 and 30 degrees C (59 and 86 degrees F). Protect from light and moisture. Throw away any unused medicine after the expiration date. NOTE: This sheet is a summary. It may not cover all possible information. If you have questions about this medicine, talk to your doctor, pharmacist, or health care provider.  2014, [employer]. (2012-06-12 16:40:16)  Ondansetron tablets What is this medicine? ONDANSETRON (on DAN se tron) is used to treat nausea and  vomiting caused by chemotherapy. It is also used to prevent or treat nausea and vomiting after surgery. This medicine may be used for other purposes; ask your health care provider or pharmacist if you have questions. COMMON BRAND NAME(S): Zofran What should I tell my health care provider before I take this medicine? They need to know if you have any of these conditions: -heart disease -history of irregular heartbeat -liver disease -low levels of magnesium or potassium in the blood -an unusual or allergic reaction to ondansetron, granisetron, other medicines, foods, dyes, or preservatives -pregnant or trying to get pregnant -breast-feeding How should I  use this medicine? Take this medicine by mouth with a glass of water. Follow the directions on your prescription label. Take your doses at regular intervals. Do not take your medicine more often than directed. Talk to your pediatrician regarding the use of this medicine in children. Special care may be needed. Overdosage: If you think you have taken too much of this medicine contact a poison control center or emergency room at once. NOTE: This medicine is only for you. Do not share this medicine with others. What if I miss a dose? If you miss a dose, take it as soon as you can. If it is almost time for your next dose, take only that dose. Do not take double or extra doses. What may interact with this medicine? Do not take this medicine with any of the following medications: -apomorphine -certain medicines for fungal infections like fluconazole, itraconazole, ketoconazole, posaconazole, voriconazole -cisapride -dofetilide -dronedarone -pimozide -thioridazine -ziprasidone  This medicine may also interact with the following medications: -carbamazepine -certain medicines for depression, anxiety, or psychotic disturbances -fentanyl -linezolid -MAOIs like Carbex, Eldepryl, Marplan, Nardil, and Parnate -methylene blue (injected into a vein) -other medicines that prolong the QT interval (cause an abnormal heart rhythm) -phenytoin -rifampicin -tramadol This list may not describe all possible interactions. Give your health care provider a list of all the medicines, herbs, non-prescription drugs, or dietary supplements you use. Also tell them if you smoke, drink alcohol, or use illegal drugs. Some items may interact with your medicine. What should I watch for while using this medicine? Check with your doctor or health care professional right away if you have any sign of an allergic reaction. What side effects may I notice from receiving this medicine? Side effects that you should report to your  doctor or health care professional as soon as possible: -allergic reactions like skin rash, itching or hives, swelling of the face, lips or tongue -breathing problems -confusion -dizziness -fast or irregular heartbeat -feeling faint or lightheaded, falls -fever and chills -loss of balance or coordination -seizures -sweating -swelling of the hands or feet -tightness in the chest -tremors -unusually weak or tired Side effects that usually do not require medical attention (report to your doctor or health care professional if they continue or are bothersome): -constipation or diarrhea -headache This list may not describe all possible side effects. Call your doctor for medical advice about side effects. You may report side effects to FDA at 1-800-FDA-1088. Where should I keep my medicine? Keep out of the reach of children. Store between 2 and 30 degrees C (36 and 86 degrees F). Throw away any unused medicine after the expiration date. NOTE: This sheet is a summary. It may not cover all possible information. If you have questions about this medicine, talk to your doctor, pharmacist, or health care provider.  2014, [employer]. (2013-05-21 16:27:45)  Acetaminophen; Oxycodone tablets What  is this medicine? ACETAMINOPHEN; OXYCODONE (a set a MEE noe fen; ox i KOE done) is a pain reliever. It is used to treat mild to moderate pain. This medicine may be used for other purposes; ask your health care provider or pharmacist if you have questions. COMMON BRAND NAME(S): Endocet, Magnacet, Narvox, Percocet, Perloxx, Primalev, Primlev, Roxicet, Xolox What should I tell my health care provider before I take this medicine? They need to know if you have any of these conditions: -brain tumor -Crohn's disease, inflammatory bowel disease, or ulcerative colitis -drug abuse or addiction -head injury -heart or circulation problems -if you often drink alcohol -kidney disease or problems going to  the bathroom -liver disease -lung disease, asthma, or breathing problems -an unusual or allergic reaction to acetaminophen, oxycodone, other opioid analgesics, other medicines, foods, dyes, or preservatives -pregnant or trying to get pregnant -breast-feeding How should I use this medicine? Take this medicine by mouth with a full glass of water. Follow the directions on the prescription label. Take your medicine at regular intervals. Do not take your medicine more often than directed. Talk to your pediatrician regarding the use of this medicine in children. Special care may be needed. Patients over 46 years old may have a stronger reaction and need a smaller dose. Overdosage: If you think you have taken too much of this medicine contact a poison control center or emergency room at once. NOTE: This medicine is only for you. Do not share this medicine with others. What if I miss a dose? If you miss a dose, take it as soon as you can. If it is almost time for your next dose, take only that dose. Do not take double or extra doses. What may interact with this medicine? -alcohol -antihistamines -barbiturates like amobarbital, butalbital, butabarbital, methohexital, pentobarbital, phenobarbital, thiopental, and secobarbital -benztropine -drugs for bladder problems like solifenacin, trospium, oxybutynin, tolterodine, hyoscyamine, and methscopolamine -drugs for breathing problems like ipratropium and tiotropium -drugs for certain stomach or intestine problems like propantheline, homatropine methylbromide, glycopyrrolate, atropine, belladonna, and dicyclomine -general anesthetics like etomidate, ketamine, nitrous oxide, propofol, desflurane, enflurane, halothane, isoflurane, and sevoflurane -medicines for depression, anxiety, or psychotic disturbances -medicines for sleep -muscle relaxants -naltrexone -narcotic medicines (opiates) for pain -phenothiazines like perphenazine, thioridazine,  chlorpromazine, mesoridazine, fluphenazine, prochlorperazine, promazine, and trifluoperazine -scopolamine -tramadol -trihexyphenidyl This list may not describe all possible interactions. Give your health care provider a list of all the medicines, herbs, non-prescription drugs, or dietary supplements you use. Also tell them if you smoke, drink alcohol, or use illegal drugs. Some items may interact with your medicine. What should I watch for while using this medicine? Tell your doctor or health care professional if your pain does not go away, if it gets worse, or if you have new or a different type of pain. You may develop tolerance to the medicine. Tolerance means that you will need a higher dose of the medication for pain relief. Tolerance is normal and is expected if you take this medicine for a long time. Do not suddenly stop taking your medicine because you may develop a severe reaction. Your body becomes used to the medicine. This does NOT mean you are addicted. Addiction is a behavior related to getting and using a drug for a non-medical reason. If you have pain, you have a medical reason to take pain medicine. Your doctor will tell you how much medicine to take. If your doctor wants you to stop the medicine, the dose will be slowly lowered over  time to avoid any side effects. You may get drowsy or dizzy. Do not drive, use machinery, or do anything that needs mental alertness until you know how this medicine affects you. Do not stand or sit up quickly, especially if you are an older patient. This reduces the risk of dizzy or fainting spells. Alcohol may interfere with the effect of this medicine. Avoid alcoholic drinks. There are different types of narcotic medicines (opiates) for pain. If you take more than one type at the same time, you may have more side effects. Give your health care provider a list of all medicines you use. Your doctor will tell you how much medicine to take. Do not take more  medicine than directed. Call emergency for help if you have problems breathing. The medicine will cause constipation. Try to have a bowel movement at least every 2 to 3 days. If you do not have a bowel movement for 3 days, call your doctor or health care professional. Do not take Tylenol (acetaminophen) or medicines that have acetaminophen with this medicine. Too much acetaminophen can be very dangerous. Many nonprescription medicines contain acetaminophen. Always read the labels carefully to avoid taking more acetaminophen. What side effects may I notice from receiving this medicine? Side effects that you should report to your doctor or health care professional as soon as possible: -allergic reactions like skin rash, itching or hives, swelling of the face, lips, or tongue -breathing difficulties, wheezing -confusion -light headedness or fainting spells -severe stomach pain -unusually weak or tired -yellowing of the skin or the whites of the eyes  Side effects that usually do not require medical attention (report to your doctor or health care professional if they continue or are bothersome): -dizziness -drowsiness -nausea -vomiting This list may not describe all possible side effects. Call your doctor for medical advice about side effects. You may report side effects to FDA at 1-800-FDA-1088. Where should I keep my medicine? Keep out of the reach of children. This medicine can be abused. Keep your medicine in a safe place to protect it from theft. Do not share this medicine with anyone. Selling or giving away this medicine is dangerous and against the law. Store at room temperature between 20 and 25 degrees C (68 and 77 degrees F). Keep container tightly closed. Protect from light. This medicine may cause accidental overdose and death if it is taken by other adults, children, or pets. Flush any unused medicine down the toilet to reduce the chance of harm. Do not use the medicine after the  expiration date. NOTE: This sheet is a summary. It may not cover all possible information. If you have questions about this medicine, talk to your doctor, pharmacist, or health care provider.  2014, [employer]. (2013-04-07 13:17:35)

## 2013-10-26 NOTE — ED Provider Notes (Addendum)
CSN: [id number]     Arrival date & time 10/26/13  4315 History   First MD Initiated Contact with Patient 10/26/13 [phone]     Chief Complaint  Patient presents with  . Abdominal Pain     (Consider location/radiation/quality/duration/timing/severity/associated sxs/prior Treatment) Patient is a 48 y.o. female presenting with abdominal pain. The history is provided by the patient.  Abdominal Pain She is complaining of upper abdominal pain for the last week. Pain is: Cramping and she rates it an 8/10. There is no radiation of pain. There is no associated nausea or vomiting. She states she's been having normal bowel movements but still feels like she has to move her bowels more at the end of the bowel movement. Pain is not affected by eating or having a bowel movement. There's been no fever, chills, sweats. She's tried taking ibuprofen with no relief.  Past Medical History  Diagnosis Date  . Hypertension    Past Surgical History  Procedure Laterality Date  . Eye surgery    . Tonsillectomy     Family History  Problem Relation Age of Onset  . Hypertension Mother    History  Substance Use Topics  . Smoking status: Current Every Day Smoker -- 0.50 packs/day    Types: Cigarettes  . Smokeless tobacco: Not on file  . Alcohol Use: Yes     Comment: occassional   OB History   Grav Para Term Preterm Abortions TAB SAB Ect Mult Living   1 1        1      Review of Systems  Gastrointestinal: Positive for abdominal pain.  All other systems reviewed and are negative.      Allergies  Review of patient's allergies indicates no known allergies.  Home Medications   Current Outpatient Rx  Name  Route  Sig  Dispense  Refill  . diphenhydrAMINE (BENADRYL) 25 mg capsule   Oral   Take 25 mg by mouth every 6 (six) hours as needed for allergies.         Marland Kitchen HYDROcodone-acetaminophen (NORCO/VICODIN) 5-325 MG per tablet   Oral   Take 1 tablet by mouth every 6 (six) hours as needed for pain.          . meloxicam (MOBIC) 7.5 MG tablet   Oral   Take 1 tablet (7.5 mg total) by mouth daily.   10 tablet   0    BP 153/105  Pulse 81  Temp(Src) 97.9 F (36.6 C) (Oral)  Resp 18  Ht 5\' 3"  (1.6 m)  Wt 136 lb (61.689 kg)  BMI 24.10 kg/m2  SpO2 100%  LMP 09/28/2013 Physical Exam  Nursing note and vitals reviewed.  48 year old female, resting comfortably and in no acute distress. Vital signs are significant for hypertension with blood pressure 153/105. Oxygen saturation is 100%, which is normal. Head is normocephalic and atraumatic. PERRLA, EOMI. Oropharynx is clear. Neck is nontender and supple without adenopathy or JVD. Back is nontender and there is no CVA tenderness. Lungs are clear without rales, wheezes, or rhonchi. Chest is nontender. Heart has regular rate and rhythm without murmur. Abdomen is soft, flat, with mild to moderate epigastric tenderness. There is no rebound or guarding. There are no masses or hepatosplenomegaly and peristalsis is hypoactive. Extremities have no cyanosis or edema, full range of motion is present. Skin is warm and dry without rash. Neurologic: Mental status is normal, cranial nerves are intact, there are no motor or sensory deficits.  ED Course  Procedures (including critical care time) Labs Review Results for orders placed during the hospital encounter of 10/26/13  URINALYSIS, ROUTINE W REFLEX MICROSCOPIC      Result Value Ref Range   Color, Urine YELLOW  YELLOW   APPearance CLEAR  CLEAR   Specific Gravity, Urine 1.015  1.005 - 1.030   pH 6.5  5.0 - 8.0   Glucose, UA NEGATIVE  NEGATIVE mg/dL   Hgb urine dipstick TRACE (*) NEGATIVE   Bilirubin Urine NEGATIVE  NEGATIVE   Ketones, ur NEGATIVE  NEGATIVE mg/dL   Protein, ur NEGATIVE  NEGATIVE mg/dL   Urobilinogen, UA 0.2  0.0 - 1.0 mg/dL   Nitrite NEGATIVE  NEGATIVE   Leukocytes, UA TRACE (*) NEGATIVE  PREGNANCY, URINE      Result Value Ref Range   Preg Test, Ur NEGATIVE  NEGATIVE   CBC WITH DIFFERENTIAL      Result Value Ref Range   WBC 4.8  4.0 - 10.5 K/uL   RBC 4.74  3.87 - 5.11 MIL/uL   Hemoglobin 12.7  12.0 - 15.0 g/dL   HCT 37.8  36.0 - 46.0 %   MCV 79.7  78.0 - 100.0 fL   MCH 26.8  26.0 - 34.0 pg   MCHC 33.6  30.0 - 36.0 g/dL   RDW 14.2  11.5 - 15.5 %   Platelets 254  150 - 400 K/uL   Neutrophils Relative % 46  43 - 77 %   Neutro Abs 2.2  1.7 - 7.7 K/uL   Lymphocytes Relative 46  12 - 46 %   Lymphs Abs 2.2  0.7 - 4.0 K/uL   Monocytes Relative 7  3 - 12 %   Monocytes Absolute 0.3  0.1 - 1.0 K/uL   Eosinophils Relative 1  0 - 5 %   Eosinophils Absolute 0.1  0.0 - 0.7 K/uL   Basophils Relative 0  0 - 1 %   Basophils Absolute 0.0  0.0 - 0.1 K/uL  COMPREHENSIVE METABOLIC PANEL      Result Value Ref Range   Sodium 136 (*) 137 - 147 mEq/L   Potassium 4.0  3.7 - 5.3 mEq/L   Chloride 99  96 - 112 mEq/L   CO2 28  19 - 32 mEq/L   Glucose, Bld 102 (*) 70 - 99 mg/dL   BUN 13  6 - 23 mg/dL   Creatinine, Ser 0.73  0.50 - 1.10 mg/dL   Calcium 9.2  8.4 - 10.5 mg/dL   Total Protein 7.3  6.0 - 8.3 g/dL   Albumin 3.6  3.5 - 5.2 g/dL   AST 18  0 - 37 U/L   ALT 12  0 - 35 U/L   Alkaline Phosphatase 49  39 - 117 U/L   Total Bilirubin 0.2 (*) 0.3 - 1.2 mg/dL   GFR calc non Af Amer >90  >90 mL/min   GFR calc Af Amer >90  >90 mL/min  LIPASE, BLOOD      Result Value Ref Range   Lipase 64 (*) 11 - 59 U/L  URINE MICROSCOPIC-ADD ON      Result Value Ref Range   Squamous Epithelial / LPF MANY (*) RARE   WBC, UA 0-2  <3 WBC/hpf   RBC / HPF 0-2  <3 RBC/hpf   Bacteria, UA FEW (*) RARE   Urine-Other TRICHOMONAS PRESENT     Imaging Review:  Dg Abd 1 View  10/26/2013   CLINICAL DATA:  Abdominal pain for 1  week.  Diarrhea.  EXAM: ABDOMEN - 1 VIEW  COMPARISON:  None available for comparison at time of study interpretation.  FINDINGS: Bowel gas pattern is nondilated and nonobstructive. Mild amount of retained large bowel stool. No intra-abdominal mass effect. Phleboliths in  the pelvis. Soft tissue planes and included osseous structures are nonsuspicious.  IMPRESSION: Mild amount of retained large bowel stool with nonobstructive bowel gas pattern.   Electronically Signed   By: Elon Alas   On: 10/26/2013 04:50      MDM   Final diagnoses:  Epigastric pain  Elevated lipase  Trichomonas infection    Abdominal pain of uncertain cause. With epigastric tenderness, I am suspicious of gastric ulcer. She will be given a trial of GI cocktail and IV pantoprazole. Screening labs and a KUB have been ordered.  Workup is significant for mildly elevated lipase. This is of uncertain clinical significance. She got significant relief of pain with GI cocktail and IV pantoprazole. She is discharged with prescriptions for pantoprazole, ondansetron, and oxycodone acetaminophen. She is advised to return should symptoms worsen.  Delora Fuel, MD [id number] [id number]  Urinalysis does have Trichomonas present so she is given a dose of metronidazole in the ED  Delora Fuel, MD [id number]

## 2013-12-04 ENCOUNTER — Emergency Department (HOSPITAL_COMMUNITY)
Admission: EM | Admit: 2013-12-04 | Discharge: 2013-12-04 | Disposition: A | Discharge status: Home / Self Care | Payer: BC Managed Care – PPO | Attending: Emergency Medicine | Admitting: Emergency Medicine

## 2013-12-04 ENCOUNTER — Encounter (HOSPITAL_COMMUNITY): Payer: Self-pay | Admitting: Emergency Medicine

## 2013-12-04 DIAGNOSIS — J309 Allergic rhinitis, unspecified: Secondary | ICD-10-CM | POA: Insufficient documentation

## 2013-12-04 DIAGNOSIS — Z79899 Other long term (current) drug therapy: Secondary | ICD-10-CM | POA: Insufficient documentation

## 2013-12-04 DIAGNOSIS — F17200 Nicotine dependence, unspecified, uncomplicated: Secondary | ICD-10-CM | POA: Insufficient documentation

## 2013-12-04 DIAGNOSIS — J302 Other seasonal allergic rhinitis: Secondary | ICD-10-CM

## 2013-12-04 DIAGNOSIS — I10 Essential (primary) hypertension: Secondary | ICD-10-CM | POA: Insufficient documentation

## 2013-12-04 MED ORDER — CETIRIZINE-PSEUDOEPHEDRINE ER 5-120 MG PO TB12: 1.0000 | ORAL_TABLET | Freq: Two times a day (BID) | ORAL | Status: DC

## 2013-12-04 NOTE — ED Notes (Signed)
Pt c/o scratchy throat, puffy eyes, and runny nose x 2 days.

## 2013-12-04 NOTE — ED Notes (Signed)
Pt alert & oriented x4, stable gait. Patient given discharge instructions, paperwork & prescription(s). Patient  instructed to stop at the registration desk to finish any additional paperwork. Patient verbalized understanding. Pt left department w/ no further questions. 

## 2013-12-04 NOTE — Discharge Instructions (Signed)
Hay Fever Hay fever is an allergic reaction to particles in the air. It cannot be passed from person to person. It cannot be cured, but it can be controlled. CAUSES  Hay fever is caused by something that triggers an allergic reaction (allergens). The following are examples of allergens:  Ragweed.  Feathers.  Animal dander.  Grass and tree pollens.  Cigarette smoke.  House dust.  Pollution. SYMPTOMS   Sneezing.  Runny or stuffy nose.  Tearing eyes.  Itchy eyes, nose, mouth, throat, skin, or other area.  Sore throat.  Headache.  Decreased sense of smell or taste. DIAGNOSIS Your caregiver will perform a physical exam and ask questions about the symptoms you are having.Allergy testing may be done to determine exactly what triggers your hay fever.  TREATMENT   Over-the-counter medicines may help symptoms. These include:  Antihistamines.  Decongestants. These may help with nasal congestion.  Your caregiver may prescribe medicines if over-the-counter medicines do not work.  Some people benefit from allergy shots when other medicines are not helpful. HOME CARE INSTRUCTIONS   Avoid the allergen that is causing your symptoms, if possible.  Take all medicine as told by your caregiver. SEEK MEDICAL CARE IF:   You have severe allergy symptoms and your current medicines are not helping.  Your treatment was working at one time, but you are now experiencing symptoms.  You have sinus congestion and pressure.  You develop a fever or headache.  You have thick nasal discharge.  You have asthma and have a worsening cough and wheezing. SEEK IMMEDIATE MEDICAL CARE IF:   You have swelling of your tongue or lips.  You have trouble breathing.  You feel lightheaded or like you are going to faint.  You have cold sweats.  You have a fever. Document Released: 08/14/2005 Document Revised: 11/06/2011 Document Reviewed: 11/09/2010 [employer] Patient Information 2014  [employer]  

## 2013-12-05 NOTE — ED Provider Notes (Signed)
CSN: [id number]     Arrival date & time 12/04/13  9 History   First MD Initiated Contact with Patient 12/04/13 1500     Chief Complaint  Patient presents with  . Sore Throat     (Consider location/radiation/quality/duration/timing/severity/associated sxs/prior Treatment) HPI Comments: [name] is a 48 y.o. Female presenting with a 2 day history of uri type symptoms which includes nasal congestion with clear rhinorrhea, scratchy throat, sneezing,  Itching and watery eyes along with increased post nasal drip.  Symptoms due to not include sinus pain, fever, shortness of breath, chest pain,  Nausea, vomiting or diarrhea.  The patient has taken benadryl prior to arrival with improvement in her symptoms but with increased drowsiness.  The history is provided by the patient.    Past Medical History  Diagnosis Date  . Hypertension    Past Surgical History  Procedure Laterality Date  . Eye surgery    . Tonsillectomy     Family History  Problem Relation Age of Onset  . Hypertension Mother    History  Substance Use Topics  . Smoking status: Current Every Day Smoker -- 0.50 packs/day    Types: Cigarettes  . Smokeless tobacco: Not on file  . Alcohol Use: Yes     Comment: occassional   OB History   Grav Para Term Preterm Abortions TAB SAB Ect Mult Living   1 1        1      Review of Systems  Constitutional: Negative for fever and chills.  HENT: Positive for congestion, postnasal drip, rhinorrhea and sore throat. Negative for ear discharge, ear pain, sinus pressure, trouble swallowing and voice change.   Eyes: Positive for redness and itching. Negative for discharge.  Respiratory: Negative for cough, shortness of breath, wheezing and stridor.   Cardiovascular: Negative for chest pain.  Gastrointestinal: Negative for abdominal pain.  Genitourinary: Negative.       Allergies  Review of patient's allergies indicates no known allergies.  Home Medications   Current  Outpatient Rx  Name  Route  Sig  Dispense  Refill  . diphenhydrAMINE (BENADRYL) 25 mg capsule   Oral   Take 25 mg by mouth every 6 (six) hours as needed for allergies.         . Multiple Vitamin (MULTIVITAMIN WITH MINERALS) TABS tablet   Oral   Take 1 tablet by mouth daily.         . pantoprazole (PROTONIX) 40 MG tablet   Oral   Take 1 tablet (40 mg total) by mouth daily.   30 tablet   0   . cetirizine-pseudoephedrine (ZYRTEC-D) 5-120 MG per tablet   Oral   Take 1 tablet by mouth 2 (two) times daily.   30 tablet   0    BP 138/93  Pulse 85  Temp(Src) 98.2 F (36.8 C) (Oral)  Resp 18  Ht 5\' 3"  (1.6 m)  Wt 131 lb (59.421 kg)  BMI 23.21 kg/m2  SpO2 100%  LMP 10/17/2013 Physical Exam  Constitutional: She is oriented to person, place, and time. She appears well-developed and well-nourished.  HENT:  Head: Normocephalic and atraumatic.  Right Ear: Tympanic membrane and ear canal normal.  Left Ear: Tympanic membrane and ear canal normal.  Nose: Rhinorrhea present. No mucosal edema.  Mouth/Throat: Uvula is midline, oropharynx is clear and moist and mucous membranes are normal. No oropharyngeal exudate, posterior oropharyngeal edema, posterior oropharyngeal erythema or tonsillar abscesses.  Eyes: EOM are normal. Pupils are  equal, round, and reactive to light. Right eye exhibits no chemosis and no exudate. Left eye exhibits no chemosis and no exudate. Right conjunctiva is injected. Left conjunctiva is injected.  Palpebral conjunctival cobblestoning.  Nares appear boggy, not erythematous.  Cardiovascular: Normal rate and normal heart sounds.   Pulmonary/Chest: Effort normal. No respiratory distress. She has no wheezes. She has no rales.  Abdominal: Soft. There is no tenderness.  Musculoskeletal: Normal range of motion.  Neurological: She is alert and oriented to person, place, and time.  Skin: Skin is warm and dry. No rash noted.  Psychiatric: She has a normal mood and affect.     ED Course  Procedures (including critical care time) Labs Review Labs Reviewed - No data to display Imaging Review No results found.   EKG Interpretation None      MDM   Final diagnoses:  Seasonal allergies    Zyrtec D.  Referrals given for establishing pcp.    The patient appears reasonably screened and/or stabilized for discharge and I doubt any other medical condition or other [facility] requiring further screening, evaluation, or treatment in the ED at this time prior to discharge.     Evalee Jefferson, PA-C 12/05/13 1232

## 2013-12-10 NOTE — ED Provider Notes (Signed)
Medical screening examination/treatment/procedure(s) were performed by non-physician practitioner and as supervising physician I was immediately available for consultation/collaboration.   EKG Interpretation None      Rolland Porter, MD, Abram Sander   Janice Norrie, MD 12/10/13 1300

## 2014-01-08 ENCOUNTER — Encounter (HOSPITAL_COMMUNITY): Payer: Self-pay | Admitting: Emergency Medicine

## 2014-01-08 ENCOUNTER — Emergency Department (HOSPITAL_COMMUNITY): Payer: BC Managed Care – PPO

## 2014-01-08 ENCOUNTER — Emergency Department (HOSPITAL_COMMUNITY)
Admission: EM | Admit: 2014-01-08 | Discharge: 2014-01-08 | Disposition: A | Discharge status: Home / Self Care | Payer: BC Managed Care – PPO | Attending: Emergency Medicine | Admitting: Emergency Medicine

## 2014-01-08 DIAGNOSIS — J209 Acute bronchitis, unspecified: Secondary | ICD-10-CM

## 2014-01-08 DIAGNOSIS — F17200 Nicotine dependence, unspecified, uncomplicated: Secondary | ICD-10-CM | POA: Insufficient documentation

## 2014-01-08 DIAGNOSIS — Z792 Long term (current) use of antibiotics: Secondary | ICD-10-CM | POA: Insufficient documentation

## 2014-01-08 DIAGNOSIS — J309 Allergic rhinitis, unspecified: Secondary | ICD-10-CM | POA: Insufficient documentation

## 2014-01-08 DIAGNOSIS — IMO0002 Reserved for concepts with insufficient information to code with codable children: Secondary | ICD-10-CM | POA: Insufficient documentation

## 2014-01-08 DIAGNOSIS — Z79899 Other long term (current) drug therapy: Secondary | ICD-10-CM | POA: Insufficient documentation

## 2014-01-08 DIAGNOSIS — I10 Essential (primary) hypertension: Secondary | ICD-10-CM | POA: Insufficient documentation

## 2014-01-08 MED ORDER — AZITHROMYCIN 250 MG PO TABS: 250.0000 mg | ORAL_TABLET | Freq: Every day | ORAL | Status: DC

## 2014-01-08 MED ORDER — FEXOFENADINE-PSEUDOEPHED ER 60-120 MG PO TB12: 1.0000 | ORAL_TABLET | Freq: Two times a day (BID) | ORAL | Status: DC

## 2014-01-08 MED ORDER — PREDNISONE 10 MG PO TABS: ORAL_TABLET | ORAL | Status: DC

## 2014-01-08 MED ORDER — PROMETHAZINE-CODEINE 6.25-10 MG/5ML PO SYRP
5.0000 mL | ORAL_SOLUTION | ORAL | Status: DC | PRN
Start: 2014-01-08 — End: 2014-04-29

## 2014-01-08 MED ORDER — AZITHROMYCIN 250 MG PO TABS
500.0000 mg | ORAL_TABLET | Freq: Once | ORAL | Status: AC
  Administered 2014-01-08: 500 mg via ORAL
  Filled 2014-01-08: qty 2

## 2014-01-08 MED ORDER — PREDNISONE 50 MG PO TABS
60.0000 mg | ORAL_TABLET | Freq: Once | ORAL | Status: AC
  Administered 2014-01-08: 60 mg via ORAL
  Filled 2014-01-08 (×2): qty 1

## 2014-01-08 NOTE — Discharge Instructions (Signed)
Acute Bronchitis Bronchitis is inflammation of the airways that extend from the windpipe into the lungs (bronchi). The inflammation often causes mucus to develop. This leads to a cough, which is the most common symptom of bronchitis.  In acute bronchitis, the condition usually develops suddenly and goes away over time, usually in a couple weeks. Smoking, allergies, and asthma can make bronchitis worse. Repeated episodes of bronchitis may cause further lung problems.  CAUSES Acute bronchitis is most often caused by the same virus that causes a cold. The virus can spread from person to person (contagious).  SIGNS AND SYMPTOMS   Cough.   Fever.   Coughing up mucus.   Body aches.   Chest congestion.   Chills.   Shortness of breath.   Sore throat.  DIAGNOSIS  Acute bronchitis is usually diagnosed through a physical exam. Tests, such as chest X-rays, are sometimes done to rule out other conditions.  TREATMENT  Acute bronchitis usually goes away in a couple weeks. Often times, no medical treatment is necessary. Medicines are sometimes given for relief of fever or cough. Antibiotics are usually not needed but may be prescribed in certain situations. In some cases, an inhaler may be recommended to help reduce shortness of breath and control the cough. A cool mist vaporizer may also be used to help thin bronchial secretions and make it easier to clear the chest.  HOME CARE INSTRUCTIONS  Get plenty of rest.   Drink enough fluids to keep your urine clear or pale yellow (unless you have a medical condition that requires fluid restriction). Increasing fluids may help thin your secretions and will prevent dehydration.   Only take over-the-counter or prescription medicines as directed by your health care provider.   Avoid smoking and secondhand smoke. Exposure to cigarette smoke or irritating chemicals will make bronchitis worse. If you are a smoker, consider using nicotine gum or skin  patches to help control withdrawal symptoms. Quitting smoking will help your lungs heal faster.   Reduce the chances of another bout of acute bronchitis by washing your hands frequently, avoiding people with cold symptoms, and trying not to touch your hands to your mouth, nose, or eyes.   Follow up with your health care provider as directed.  SEEK MEDICAL CARE IF: Your symptoms do not improve after 1 week of treatment.  SEEK IMMEDIATE MEDICAL CARE IF:  You develop an increased fever or chills.   You have chest pain.   You have severe shortness of breath.  You have bloody sputum.   You develop dehydration.  You develop fainting.  You develop repeated vomiting.  You develop a severe headache. MAKE SURE YOU:   Understand these instructions.  Will watch your condition.  Will get help right away if you are not doing well or get worse. Document Released: 09/21/2004 Document Revised: 04/16/2013 Document Reviewed: 02/04/2013 [facility] Patient Information 2014 [place].  Allergic Rhinitis Allergic rhinitis is when the mucous membranes in the nose respond to allergens. Allergens are particles in the air that cause your body to have an allergic reaction. This causes you to release allergic antibodies. Through a chain of events, these eventually cause you to release histamine into the blood stream. Although meant to protect the body, it is this release of histamine that causes your discomfort, such as frequent sneezing, congestion, and an itchy, runny nose.  CAUSES  Seasonal allergic rhinitis (hay fever) is caused by pollen allergens that may come from grasses, trees, and weeds. Year-round allergic rhinitis (  perennial allergic rhinitis) is caused by allergens such as house dust mites, pet dander, and mold spores.  SYMPTOMS   Nasal stuffiness (congestion).  Itchy, runny nose with sneezing and tearing of the eyes. DIAGNOSIS  Your health care provider can help you determine  the allergen or allergens that trigger your symptoms. If you and your health care provider are unable to determine the allergen, skin or blood testing may be used. TREATMENT  Allergic Rhinitis does not have a cure, but it can be controlled by:  Medicines and allergy shots (immunotherapy).  Avoiding the allergen. Hay fever may often be treated with antihistamines in pill or nasal spray forms. Antihistamines block the effects of histamine. There are over-the-counter medicines that may help with nasal congestion and swelling around the eyes. Check with your health care provider before taking or giving this medicine.  If avoiding the allergen or the medicine prescribed do not work, there are many new medicines your health care provider can prescribe. Stronger medicine may be used if initial measures are ineffective. Desensitizing injections can be used if medicine and avoidance does not work. Desensitization is when a patient is given ongoing shots until the body becomes less sensitive to the allergen. Make sure you follow up with your health care provider if problems continue. HOME CARE INSTRUCTIONS It is not possible to completely avoid allergens, but you can reduce your symptoms by taking steps to limit your exposure to them. It helps to know exactly what you are allergic to so that you can avoid your specific triggers. SEEK MEDICAL CARE IF:   You have a fever.  You develop a cough that does not stop easily (persistent).  You have shortness of breath.  You start wheezing.  Symptoms interfere with normal daily activities. Document Released: 05/09/2001 Document Revised: 06/04/2013 Document Reviewed: 04/21/2013 [facility] Patient Information 2014 [place], [place].    Emergency Department Resource Guide 1) Find a Doctor and Pay Out of Pocket Although you won't have to find out who is covered by your insurance plan, it is a good idea to ask around and get recommendations. You will then need to  call the office and see if the doctor you have chosen will accept you as a new patient and what types of options they offer for patients who are self-pay. Some doctors offer discounts or will set up payment plans for their patients who do not have insurance, but you will need to ask so you aren't surprised when you get to your appointment.  2) Contact Your Local Health Department Not all health departments have doctors that can see patients for sick visits, but many do, so it is worth a call to see if yours does. If you don't know where your local health department is, you can check in your phone book. The CDC also has a tool to help you locate your state's health department, and many state websites also have listings of all of their local health departments.  3) Find a Walk-in Clinic If your illness is not likely to be very severe or complicated, you may want to try a walk in clinic. These are popping up all over the country in pharmacies, drugstores, and shopping centers. They're usually staffed by nurse practitioners or physician assistants that have been trained to treat common illnesses and complaints. They're usually fairly quick and inexpensive. However, if you have serious medical issues or chronic medical problems, these are probably not your best option.  No Primary Care Doctor: - Call  Health Connect at  [phone] - they can help you locate a primary care doctor that  accepts your insurance, provides certain services, etc. - Physician Referral Service- [phone]  Chronic Pain Problems: Organization         Address  Phone   Notes  [facility]  [phone] Patients need to be referred by their primary care doctor.   Medication Assistance: Organization         Address  Phone   Notes  [facility] Med[facility] [address] [place], [place] [phone] --Must be a resident of [facility] -- Must have NO insurance  coverage whatsoever (no Medicaid/ Medicare, etc.) -- The pt. MUST have a primary care doctor that directs their care regularly and follows them in the community   [facility]ist  [phone]   [employer]  [phone]    Agencies that provide inexpensive medical care: Organization         Address  Phone   Notes  [place]  [phone]   [facility]    [phone]   [facility] [place], [place] [address] [phone]   [place] [address], [place] [phone]   Planned Parenthood    [phone]   [facility]    [phone]   [place] [address], [place] Phone:  [phone], Fax:  [phone] Hours of Operation:  9 am - 6 pm, M-F.  Also accepts Medicaid/Medicare and self-pay.  [facility] for [place], [address], [place] Phone: [phone], Fax: [phone]. Hours of Operation:  8:30 am - 5:30 pm, M-F.  Also accepts Medicaid and self-pay.  [facility] [place] [address] [place] Phone: [phone]   [place], [place], [place] [phone], Ext. [phone] Mondays & Thursdays: 7-9 AM.  First 15 patients are seen on a first come, first serve basis.    [place] Providers:  Organization         Address  Phone   Notes  [facility] [address], [place] [phone] Also accepts self-pay patients.  [facility] [address], [place]  [phone]   [place], [address], [place] [phone]   [facility] [address], [place] [phone]   Lucianne Lei [address], [place]   [phone] Only accepts [place] Access [place] patients after they have their  name applied to their card.   Self-Pay (no insurance) in [facility]:  Organization         Address  Phone   Notes  Sickle Cell Patients, [facility] [place] [phone]   [facility] [place] [phone]   [facility] [place], [place], [place] [phone]   [facility]/Dr. Osei-Bonsu  [address], [place] or [address], [place] [phone] Phone number for both [place] and [place] locations is the same.  [facility] and [facility] [address], [place] [phone]   [place]  [address], [place] or [address] [phone]   [facility] [address] [place], [place] [phone], phone; [phone], fax Sees patients 1st and 3rd Saturday of every month.  Must not qualify for public or private insurance (i.e. Medicaid, Medicare, [place] Health Choice, Veterans' Benefits)  Household income should be no more than 200% of the poverty level The clinic cannot treat you if you are pregnant or think you are pregnant  Sexually transmitted diseases are not treated at the clinic.    Dental Care: Organization         Address  Phone  Notes  [facility] Department of [facility] [address] [place], [place] [phone] Accepts children up to age 40 who are enrolled in [place] or [place] Health Choice; pregnant women with a Medicaid card; and children who have applied for Medicaid or [place] Health Choice, but were declined, whose parents can pay a reduced fee at time of service.  [facility] of [facility]  [address], [place] [phone] Accepts children up to age 90 who are enrolled in [place] or [place] Health Choice; pregnant women with a Medicaid card; and children who have applied for  Medicaid or [place] Health Choice, but were declined, whose parents can pay a reduced fee at time of service.  [facility]  [address] [place], [place] [phone] Patients are seen by appointment only. Walk-ins are not accepted. [facility] will see patients 49 years of age and older. Monday - Tuesday (8am-5pm) Most Wednesdays (8:30-5pm) $30 per visit, cash only  [facility]  [address], [facility] [phone] Patients are seen by appointment only. Walk-ins are not accepted. [facility] will see patients 62 years of age and older. One Wednesday Evening (Monthly: Volunteer Based).  $30 per visit, cash only  [employer]  [phone] for adults; Children under age 60, call Graduate Pediatric Dentistry at [phone]. Children aged 42-14, please call [phone] to request a pediatric application.  Dental services are provided in all areas of dental care including fillings, crowns and bridges, complete and partial dentures, implants, gum treatment, root canals, and extractions. Preventive care is also provided. Treatment is provided to both adults and children. Patients are selected via a lottery and there is often a waiting list.   [facility] [address], [place]  [phone] [email or web address]   [facility] [address] [place], [place] [phone], Ext. [phone] Second and Fourth Thursday of each month, opens at 6:30 AM; Clinic ends at 9 AM.  Patients are seen on a first-come first-served basis, and a limited number are seen during each clinic.   [facility]  [address] [place], [place] [phone]   Eligibility Requirements You must have lived in [place], [place], or [place] counties for at least the last three months.   You cannot be eligible for state or federal sponsored [place], including [employer], [place],  or [employer].   You generally cannot be eligible for healthcare insurance through your employer.    How to apply: Eligibility screenings are held every Tuesday and Wednesday afternoon from 1:00 pm until 4:00 pm. You do not need an appointment for the interview!  [facility] [address], [place], [place] [phone]   [facility]  [phone]   [facility]  [phone]   [facility]  [phone]    Behavioral Health Resources in the Community: Intensive Outpatient Programs Organization         Address  Phone  Notes  [facility] [address], [place], [place] [phone]   [facility] [address], [place], [place] [phone]   [facility] [address], [place], [place]  [phone]   [facility] [address],  [place], [place] [phone] or [phone]   Substance Abuse Resources Organization         Address  Phone  Notes  [facility]  [phone]   [facility]  [phone]   [facility]  [phone]   [facility]  [phone]   Residential & Outpatient Substance Abuse Program  [phone]   Psychological Services Organization         Address  Phone  Notes  [facility]  [phone]   [facility]  [phone]- [phone]   [facility] [address], [place] [phone] or [phone]    Mobile Crisis Teams Organization         Address  Phone  Notes  [facility], Mobile Crisis Care Unit  [phone]   [facility]  [address] [place], [place] [phone]   Doristine LocksSharon DeEsch [address] [place] [phone]    Self-Help/Support Groups Organization         Address  Phone             Notes  [facility] [place] - variety of support groups   [phone]- [phone] Call for more information  Narcotics Anonymous (NA), [facility] [address], [place]   2 meetings at this location   Statisticianesidential Treatment Programs Organization         Address  Phone  Notes  [facility] [address],    [place]  [phone]   [facility]  [address], [place] [phone], [place], [place] [phone]   [facility] [address], [place] [phone] Admissions: 8am-3pm M-F  [facility] [address]    [place], [place] [phone]   [facility] [address], [place], [place] [phone]   [facility] [address]  [place], [place] [phone]   [facility] - Intensive Outpatient [address], [place], [place] [phone]   [facility] ([facility] [address]  [place], [place] [phone] or [phone]   [facility] ([facility]) [address] [place], [place] [phone] Accepts Medicaid  [facility] [address]  [place] [phone] Substance Abuse/Addiction Treatment   [facility] Resources Organization         Address  Phone  Notes  [facility]  [phone]   Angie FavaJulie Brannon, PhD [address], [address] [place], [place]   [phone] or [phone]) [phone]   [facility]    [address] [place], [place] [phone]) [phone]   [facility] [address], [place], [place] [phone] Insurance/Medicaid/sponsorship through [employer] [facility]  Faith and Families [address]  [place], [place] [phone] [place], [place] [phone]    Dr. Adele Schilder  [phone]   [facility] Dept. 1) [address] [place] 2) [place] 3)  [address], [place] [phone]  [phone]   [place] [phone] or [phone] (After Hours)

## 2014-01-08 NOTE — ED Notes (Signed)
Patient c/o productive cough with sinus pressure x 1 month.  Patient states has been taking Zyrtec without relief.

## 2014-01-08 NOTE — ED Notes (Signed)
Pt verbalized understanding of no driving within 4 hours of taking cough syrup prescribed due to med causes drowsiness

## 2014-01-10 NOTE — ED Provider Notes (Signed)
CSN: [id number]     Arrival date & time 01/08/14  1955 History   First MD Initiated Contact with Patient 01/08/14 2011     Chief Complaint  Patient presents with  . Cough     (Consider location/radiation/quality/duration/timing/severity/associated sxs/prior Treatment) HPI Comments:     The history is provided by the patient.   [name] is a 48 y.o. Female presenting with almost a month long history now of nasal congestion with clear nasal drainage  along with a persistent cough productive of clear sputum, post nasal drip.  She has a history of seasonal allergies and has been taking benadryl without relief.  She finished a prescription of zyrtec she was prescribed here last month which did not improve her allergy symptoms.  She denies fevers or chills, dizziness, shortness of breath.  She does have anterior bilateral chest soreness which she relates to coughing.      Past Medical History  Diagnosis Date  . Hypertension    Past Surgical History  Procedure Laterality Date  . Eye surgery    . Tonsillectomy     Family History  Problem Relation Age of Onset  . Hypertension Mother    History  Substance Use Topics  . Smoking status: Current Every Day Smoker -- 0.50 packs/day    Types: Cigarettes  . Smokeless tobacco: Not on file  . Alcohol Use: Yes     Comment: occassional   OB History   Grav Para Term Preterm Abortions TAB SAB Ect Mult Living   1 1        1      Review of Systems  Constitutional: Negative for fever and chills.  HENT: Positive for congestion, rhinorrhea and sinus pressure. Negative for ear pain, facial swelling, hearing loss, sore throat, trouble swallowing and voice change.   Eyes: Negative for discharge.  Respiratory: Positive for cough. Negative for shortness of breath, wheezing and stridor.   Cardiovascular: Negative for chest pain.  Gastrointestinal: Negative for abdominal pain.  Genitourinary: Negative.       Allergies  Review of  patient's allergies indicates no known allergies.  Home Medications   Prior to Admission medications   Medication Sig Start Date End Date Taking? Authorizing Provider  cetirizine-pseudoephedrine (ZYRTEC-D) 5-120 MG per tablet Take 1 tablet by mouth 2 (two) times daily. 12/04/13  Yes Evalee Jefferson, PA-C  diphenhydrAMINE (BENADRYL) 25 mg capsule Take 25 mg by mouth every 6 (six) hours as needed for allergies.   Yes Historical Provider, MD  Multiple Vitamin (MULTIVITAMIN WITH MINERALS) TABS tablet Take 1 tablet by mouth daily.   Yes Historical Provider, MD  pantoprazole (PROTONIX) 40 MG tablet Take 1 tablet (40 mg total) by mouth daily. 03/03/27  Yes Delora Fuel, MD  azithromycin (ZITHROMAX) 250 MG tablet Take 1 tablet (250 mg total) by mouth daily. 01/09/14   Evalee Jefferson, PA-C  fexofenadine-pseudoephedrine (ALLEGRA-D) 60-120 MG per tablet Take 1 tablet by mouth every 12 (twelve) hours. 01/08/14   Evalee Jefferson, PA-C  predniSONE (DELTASONE) 10 MG tablet 6, 5, 4, 3, 2 then 1 tablet by mouth daily for 6 days total. 01/09/14   Evalee Jefferson, PA-C  promethazine-codeine (PHENERGAN WITH CODEINE) 6.25-10 MG/5ML syrup Take 5 mLs by mouth every 4 (four) hours as needed for cough. 01/08/14   Evalee Jefferson, PA-C   BP 144/99  Pulse 83  Temp(Src) 98.4 F (36.9 C) (Oral)  Resp 20  Ht 5\' 3"  (1.6 m)  Wt 136 lb (61.689 kg)  BMI 24.10  kg/m2  SpO2 100%  LMP 01/07/2014 Physical Exam  Constitutional: She is oriented to person, place, and time. She appears well-developed and well-nourished.  HENT:  Head: Normocephalic and atraumatic.  Right Ear: Tympanic membrane and ear canal normal.  Left Ear: Tympanic membrane and ear canal normal.  Nose: Mucosal edema present. No rhinorrhea.  Mouth/Throat: Uvula is midline, oropharynx is clear and moist and mucous membranes are normal. No oropharyngeal exudate, posterior oropharyngeal edema, posterior oropharyngeal erythema or tonsillar abscesses.  Eyes: Conjunctivae are normal.   Cardiovascular: Normal rate and normal heart sounds.   Pulmonary/Chest: Effort normal. No respiratory distress. She has no decreased breath sounds. She has no wheezes. She has no rhonchi. She has no rales.  Coarse breath sounds throughout.  ttp across posterior bilateral lower ribs.  Abdominal: Soft. There is no tenderness.  Musculoskeletal: Normal range of motion.  Neurological: She is alert and oriented to person, place, and time.  Skin: Skin is warm and dry. No rash noted.  Psychiatric: She has a normal mood and affect.    ED Course  Procedures (including critical care time) Labs Review Labs Reviewed - No data to display  Imaging Review Dg Chest 2 View  01/08/2014   CLINICAL DATA:  Cough  EXAM: CHEST  2 VIEW  COMPARISON:  None available.  FINDINGS: The cardiac and mediastinal silhouettes are within normal limits.  The lungs are normally inflated. No airspace consolidation, pleural effusion, or pulmonary edema is identified. There is no pneumothorax.  No acute osseous abnormality identified.  IMPRESSION: No active cardiopulmonary disease.   Electronically Signed   By: Jeannine Boga M.D.   On: 01/08/2014 22:12     EKG Interpretation None      MDM   Final diagnoses:  Bronchitis, acute  Allergic rhinitis    Patients labs and/or radiological studies were viewed and considered during the medical decision making and disposition process. Pt states she had similar sx last year that responded to prednisone which was prescribed for her today.  Additionally, will try allegra d in place of zyrtec - advised this may be more helpful, but won't know until we try this med - she understands.  Also prescribed zithromax given chronic cough/nasal congestion to cover for possible sinusitis (although doubt this given lack of fever) and to cover her for atypical respiratory bacteria.  Given resource guide for obtaining pcp.  Prn f/u anticipated.  The patient appears reasonably screened and/or  stabilized for discharge and I doubt any other medical condition or other [facility] requiring further screening, evaluation, or treatment in the ED at this time prior to discharge.     Evalee Jefferson, PA-C 01/10/14 1314

## 2014-01-10 NOTE — ED Provider Notes (Signed)
  Medical screening examination/treatment/procedure(s) were performed by non-physician practitioner and as supervising physician I was immediately available for consultation/collaboration.   Carmin Muskrat, MD 01/10/14 1723

## 2014-04-29 ENCOUNTER — Emergency Department (HOSPITAL_COMMUNITY)
Admission: EM | Admit: 2014-04-29 | Discharge: 2014-04-29 | Disposition: A | Discharge status: Home / Self Care | Payer: BC Managed Care – PPO | Attending: Emergency Medicine | Admitting: Emergency Medicine

## 2014-04-29 ENCOUNTER — Emergency Department (HOSPITAL_COMMUNITY): Payer: BC Managed Care – PPO

## 2014-04-29 ENCOUNTER — Encounter (HOSPITAL_COMMUNITY): Payer: Self-pay | Admitting: Emergency Medicine

## 2014-04-29 DIAGNOSIS — Z79899 Other long term (current) drug therapy: Secondary | ICD-10-CM | POA: Diagnosis not present

## 2014-04-29 DIAGNOSIS — F17200 Nicotine dependence, unspecified, uncomplicated: Secondary | ICD-10-CM | POA: Insufficient documentation

## 2014-04-29 DIAGNOSIS — M79671 Pain in right foot: Secondary | ICD-10-CM

## 2014-04-29 DIAGNOSIS — I10 Essential (primary) hypertension: Secondary | ICD-10-CM | POA: Diagnosis not present

## 2014-04-29 DIAGNOSIS — M79609 Pain in unspecified limb: Secondary | ICD-10-CM | POA: Diagnosis present

## 2014-04-29 MED ORDER — IBUPROFEN 600 MG PO TABS: 600.0000 mg | ORAL_TABLET | Freq: Four times a day (QID) | ORAL | Status: DC | PRN

## 2014-04-29 MED ORDER — IBUPROFEN 800 MG PO TABS
800.0000 mg | ORAL_TABLET | Freq: Once | ORAL | Status: AC
  Administered 2014-04-29: 800 mg via ORAL
  Filled 2014-04-29: qty 1

## 2014-04-29 NOTE — ED Notes (Signed)
Pt alert & oriented x4, stable gait. Patient given discharge instructions, paperwork & prescription(s). Patient  instructed to stop at the registration desk to finish any additional paperwork. Patient verbalized understanding. Pt left department w/ no further questions. 

## 2014-04-29 NOTE — ED Notes (Signed)
Pain to right foot x 8 months.  Denies injury.

## 2014-04-29 NOTE — Discharge Instructions (Signed)
Musculoskeletal Pain Musculoskeletal pain is muscle and boney aches and pains. These pains can occur in any part of the body. Your caregiver may treat you without knowing the cause of the pain. They may treat you if blood or urine tests, X-rays, and other tests were normal.  CAUSES There is often not a definite cause or reason for these pains. These pains may be caused by a type of germ (virus). The discomfort may also come from overuse. Overuse includes working out too hard when your body is not fit. Boney aches also come from weather changes. Bone is sensitive to atmospheric pressure changes. HOME CARE INSTRUCTIONS   Ask when your test results will be ready. Make sure you get your test results.  Only take over-the-counter or prescription medicines for pain, discomfort, or fever as directed by your caregiver. If you were given medications for your condition, do not drive, operate machinery or power tools, or sign legal documents for 24 hours. Do not drink alcohol. Do not take sleeping pills or other medications that may interfere with treatment.  Continue all activities unless the activities cause more pain. When the pain lessens, slowly resume normal activities. Gradually increase the intensity and duration of the activities or exercise.  During periods of severe pain, bed rest may be helpful. Lay or sit in any position that is comfortable.  Putting ice on the injured area.  Put ice in a bag.  Place a towel between your skin and the bag.  Leave the ice on for 15 to 20 minutes, 3 to 4 times a day.  Follow up with your caregiver for continued problems and no reason can be found for the pain. If the pain becomes worse or does not go away, it may be necessary to repeat tests or do additional testing. Your caregiver may need to look further for a possible cause. SEEK IMMEDIATE MEDICAL CARE IF:  You have pain that is getting worse and is not relieved by medications.  You develop chest pain  that is associated with shortness or breath, sweating, feeling sick to your stomach (nauseous), or throw up (vomit).  Your pain becomes localized to the abdomen.  You develop any new symptoms that seem different or that concern you. MAKE SURE YOU:   Understand these instructions.  Will watch your condition.  Will get help right away if you are not doing well or get worse. Document Released: 08/14/2005 Document Revised: 11/06/2011 Document Reviewed: 04/18/2013 [facility] Patient Information 2015 [place], [place]. This information is not intended to replace advice given to you by your health care provider. Make sure you discuss any questions you have with your health care provider.   Your x-rays are negative for a bony source of this localized foot pain and swelling, although you do have some mild arthritis at the base of your great toe.  I suspect you may have a tendinitis or possibly a ganglion cyst causing your pain and swelling.  Call Dr. Aline Brochure for further evaluation as discussed.

## 2014-05-01 NOTE — ED Provider Notes (Signed)
CSN: [id number]     Arrival date & time 04/29/14  1659 History   First MD Initiated Contact with Patient 04/29/14 1715     Chief Complaint  Patient presents with  . Foot Pain     (Consider location/radiation/quality/duration/timing/severity/associated sxs/prior Treatment) The history is provided by the patient.   [name] is a 48 y.o. female presenting with right foot pain and swelling which started approximately 8 months ago, but worsened this past week.  She denies trauma.  She works in a job requiring constant standing and walking and had to leave work early today secondary to pain.  She has a nodule on her dorsal foot which she states as slowly enlarged and is now causing difficulty finding a shoe she can comfortably wear and has to wear steel toe shoes at work.  She has taken no medication and has found no alleviators except for rest.       Past Medical History  Diagnosis Date  . Hypertension    Past Surgical History  Procedure Laterality Date  . Eye surgery    . Tonsillectomy     Family History  Problem Relation Age of Onset  . Hypertension Mother    History  Substance Use Topics  . Smoking status: Current Every Day Smoker -- 0.50 packs/day    Types: Cigarettes  . Smokeless tobacco: Not on file  . Alcohol Use: Yes     Comment: occassional   OB History   Grav Para Term Preterm Abortions TAB SAB Ect Mult Living   1 1        1      Review of Systems  Constitutional: Negative for fever.  Musculoskeletal: Positive for arthralgias and joint swelling. Negative for myalgias.  Neurological: Negative for weakness and numbness.      Allergies  Review of patient's allergies indicates no known allergies.  Home Medications   Prior to Admission medications   Medication Sig Start Date End Date Taking? Authorizing Provider  diphenhydrAMINE (BENADRYL) 25 mg capsule Take 25 mg by mouth every 6 (six) hours as needed for allergies.   Yes Historical Provider, MD   ibuprofen (ADVIL,MOTRIN) 200 MG tablet Take 200 mg by mouth every 6 (six) hours as needed for moderate pain.   Yes Historical Provider, MD  Multiple Vitamin (MULTIVITAMIN WITH MINERALS) TABS tablet Take 1 tablet by mouth daily.   Yes Historical Provider, MD  ibuprofen (ADVIL,MOTRIN) 600 MG tablet Take 1 tablet (600 mg total) by mouth every 6 (six) hours as needed. 04/29/14   Evalee Jefferson, PA-C   BP 127/86  Pulse 93  Temp(Src) 98.2 F (36.8 C) (Oral)  Resp 18  Ht 5\' 3"  (1.6 m)  Wt 138 lb (62.596 kg)  BMI 24.45 kg/m2  SpO2 100%  LMP 03/29/2014 Physical Exam  Constitutional: She appears well-developed and well-nourished.  HENT:  Head: Atraumatic.  Neck: Normal range of motion.  Cardiovascular:  Pulses equal bilaterally  Musculoskeletal: She exhibits edema and tenderness.       Right foot: She exhibits tenderness and swelling. She exhibits normal range of motion, normal capillary refill, no crepitus and no deformity.  Soft, tender, moveable nodule proximal dorsal right lateral foot.  Neurological: She is alert. She has normal strength. She displays normal reflexes. No sensory deficit.  Skin: Skin is warm and dry.  Psychiatric: She has a normal mood and affect.    ED Course  Procedures (including critical care time) Labs Review Labs Reviewed - No data to display  Imaging Review Dg Foot Complete Right  04/29/2014   CLINICAL DATA:  Right foot pain without injury  EXAM: RIGHT FOOT COMPLETE - 3+ VIEW  COMPARISON:  None.  FINDINGS: Mild degenerative changes are noted in the first MTP joint. No acute fracture or dislocation is noted. No gross soft tissue abnormality is seen.  IMPRESSION: Degenerative change without acute abnormality.   Electronically Signed   By: Inez Catalina M.D.   On: 04/29/2014 18:48     EKG Interpretation None      MDM   Final diagnoses:  Foot pain, right    Patients labs and/or radiological studies were viewed and considered during the medical decision making  and disposition process. Pt with slowly progressing soft tissue edema localized to dorsal lateral right foot.  xrays negative.  Suspect possible flexor tendon sheath or ganglion cyst.  She was prescribed ibuprofen, given Jones dressing for compression. Referral to ortho for definitive dx/tx.    The patient appears reasonably screened and/or stabilized for discharge and I doubt any other medical condition or other [facility] requiring further screening, evaluation, or treatment in the ED at this time prior to discharge.     Evalee Jefferson, PA-C 05/01/14 1422

## 2014-05-01 NOTE — ED Provider Notes (Signed)
Medical screening examination/treatment/procedure(s) were performed by non-physician practitioner and as supervising physician I was immediately available for consultation/collaboration.   EKG Interpretation None       Ezequiel Essex, MD 05/01/14 1424

## 2014-05-05 ENCOUNTER — Encounter: Payer: Self-pay | Admitting: Orthopedic Surgery

## 2014-05-05 ENCOUNTER — Ambulatory Visit (INDEPENDENT_AMBULATORY_CARE_PROVIDER_SITE_OTHER): Payer: BC Managed Care – PPO | Admitting: Orthopedic Surgery

## 2014-05-05 ENCOUNTER — Telehealth: Payer: Self-pay | Admitting: *Deleted

## 2014-05-05 ENCOUNTER — Other Ambulatory Visit: Payer: Self-pay | Admitting: *Deleted

## 2014-05-05 VITALS — BP 134/97 | Ht 63.0 in | Wt 133.8 lb

## 2014-05-05 DIAGNOSIS — M67479 Ganglion, unspecified ankle and foot: Secondary | ICD-10-CM

## 2014-05-05 DIAGNOSIS — M19071 Primary osteoarthritis, right ankle and foot: Secondary | ICD-10-CM | POA: Insufficient documentation

## 2014-05-05 DIAGNOSIS — M6740 Ganglion, unspecified site: Secondary | ICD-10-CM

## 2014-05-05 DIAGNOSIS — M79671 Pain in right foot: Secondary | ICD-10-CM

## 2014-05-05 DIAGNOSIS — M19079 Primary osteoarthritis, unspecified ankle and foot: Secondary | ICD-10-CM

## 2014-05-05 NOTE — Telephone Encounter (Signed)
REFERRAL FAXED TO DR MCKINNEY 

## 2014-05-05 NOTE — Progress Notes (Signed)
Subjective:     Patient ID: [name], female   DOB: Oct 22, 1965, 48 y.o.   MRN: [id number]  HPI Chief Complaint  Patient presents with  . Foot Problem    er follow up,right foot pain    48 year old female presents with a two-month history of progressively increasing mass on the dorsum of the foot which became significantly painful for her she had to go to the emergency room she missed work. X-ray shows she has osteoarthritis and clinical exam shows she has a dorsal ganglion on the tarsal joints of the right foot. She is out of her shoes to some of the weight bear she has radiation of pain through the foot into the plantar aspect of the foot. There was no trauma  Review of systems night sweats ringing in ears sinus problems cough chest pain excessive hunger frequent urination constipation weakness seasonal allergies rashes and muscular symptoms as stated. Medical history hypertension arthritis surgery eye socket operation in 2008 and  Past Surgical History  Procedure Laterality Date  . Eye surgery    . Tonsillectomy      Family history of cancer arthritis hypertension  Social history of smoking and occasional alcoholic beverage  Review of Systems     Objective:   Physical Exam BP 134/97  Ht 5\' 3"  (1.6 m)  Wt 133 lb 12.8 oz (60.691 kg)  BMI 23.71 kg/m2  LMP 03/29/2014 General appearance is normal, the patient is alert and oriented x3 with normal mood and affect.  Painful ambulation with antalgic gait. Is a mass on the dorsum of the foot in the tarsal joint area. Ankle range of motion is normal her ankle is stable she has tenderness over the mass and on the plantar aspect of the foot foot alignment is normal muscle tone is good there is no atrophy the skin is intact has good dorsal pulse and normal sensation. No pathologic reflexes toes downgoing.      Assessment:     X-rays are reviewed FINDINGS: Mild degenerative changes are noted in the first MTP joint. No  acute fracture or dislocation is noted. No gross soft tissue abnormality is seen.   IMPRESSION: Degenerative change without acute abnormality.     Electronically Signed   By: Inez Catalina M.D.   On: 04/29/2014 18:48     Plan:     Encounter Diagnoses  Name Primary?  . Ganglion cyst of foot Yes  . Primary osteoarthritis of right foot    Recommend referral to podiatry for further definitive care  Out of 2 weeks.

## 2014-05-05 NOTE — Patient Instructions (Signed)
Referral to Dr Caprice Beaver  OOW x 2 weeks

## 2014-05-18 ENCOUNTER — Other Ambulatory Visit (HOSPITAL_COMMUNITY): Payer: Self-pay | Admitting: Podiatry

## 2014-05-18 DIAGNOSIS — M79671 Pain in right foot: Secondary | ICD-10-CM

## 2014-05-21 ENCOUNTER — Ambulatory Visit (HOSPITAL_COMMUNITY)
Admission: RE | Admit: 2014-05-21 | Discharge: 2014-05-21 | Disposition: A | Discharge status: Home / Self Care | Payer: BC Managed Care – PPO | Source: Ambulatory Visit | Attending: Podiatry | Admitting: Podiatry

## 2014-05-21 DIAGNOSIS — M259 Joint disorder, unspecified: Secondary | ICD-10-CM | POA: Diagnosis not present

## 2014-05-21 DIAGNOSIS — M79609 Pain in unspecified limb: Secondary | ICD-10-CM | POA: Insufficient documentation

## 2014-05-21 DIAGNOSIS — M79671 Pain in right foot: Secondary | ICD-10-CM

## 2014-05-21 DIAGNOSIS — M25579 Pain in unspecified ankle and joints of unspecified foot: Secondary | ICD-10-CM | POA: Insufficient documentation

## 2014-05-21 MED ORDER — GADOBENATE DIMEGLUMINE 529 MG/ML IV SOLN
12.0000 mL | Freq: Once | INTRAVENOUS | Status: AC | PRN
  Administered 2014-05-21: 12 mL via INTRAVENOUS

## 2014-05-21 MED ORDER — GADOBENATE DIMEGLUMINE 529 MG/ML IV SOLN: 13.0000 mL | Freq: Once | INTRAVENOUS | Status: DC | PRN

## 2014-05-25 NOTE — Telephone Encounter (Signed)
PATIENT SAW DR LU 05/25/14

## 2014-06-29 ENCOUNTER — Encounter: Payer: Self-pay | Admitting: Orthopedic Surgery

## 2014-09-09 ENCOUNTER — Emergency Department (HOSPITAL_COMMUNITY)
Admission: EM | Admit: 2014-09-09 | Discharge: 2014-09-10 | Disposition: A | Discharge status: Home / Self Care | Payer: BLUE CROSS/BLUE SHIELD | Attending: Emergency Medicine | Admitting: Emergency Medicine

## 2014-09-09 ENCOUNTER — Encounter (HOSPITAL_COMMUNITY): Payer: Self-pay | Admitting: Emergency Medicine

## 2014-09-09 ENCOUNTER — Emergency Department (HOSPITAL_COMMUNITY): Payer: BLUE CROSS/BLUE SHIELD

## 2014-09-09 DIAGNOSIS — Y289XXA Contact with unspecified sharp object, undetermined intent, initial encounter: Secondary | ICD-10-CM | POA: Diagnosis not present

## 2014-09-09 DIAGNOSIS — M79646 Pain in unspecified finger(s): Secondary | ICD-10-CM

## 2014-09-09 DIAGNOSIS — S60352A Superficial foreign body of left thumb, initial encounter: Secondary | ICD-10-CM | POA: Diagnosis not present

## 2014-09-09 DIAGNOSIS — S6981XA Other specified injuries of right wrist, hand and finger(s), initial encounter: Secondary | ICD-10-CM | POA: Diagnosis not present

## 2014-09-09 DIAGNOSIS — Y9289 Other specified places as the place of occurrence of the external cause: Secondary | ICD-10-CM | POA: Diagnosis not present

## 2014-09-09 DIAGNOSIS — Y9389 Activity, other specified: Secondary | ICD-10-CM | POA: Insufficient documentation

## 2014-09-09 DIAGNOSIS — Z720 Tobacco use: Secondary | ICD-10-CM | POA: Insufficient documentation

## 2014-09-09 DIAGNOSIS — W458XXA Other foreign body or object entering through skin, initial encounter: Secondary | ICD-10-CM | POA: Diagnosis not present

## 2014-09-09 DIAGNOSIS — I10 Essential (primary) hypertension: Secondary | ICD-10-CM | POA: Diagnosis not present

## 2014-09-09 DIAGNOSIS — Y990 Civilian activity done for income or pay: Secondary | ICD-10-CM | POA: Insufficient documentation

## 2014-09-09 DIAGNOSIS — S6982XA Other specified injuries of left wrist, hand and finger(s), initial encounter: Secondary | ICD-10-CM | POA: Diagnosis present

## 2014-09-09 DIAGNOSIS — M795 Residual foreign body in soft tissue: Secondary | ICD-10-CM

## 2014-09-09 DIAGNOSIS — Z79899 Other long term (current) drug therapy: Secondary | ICD-10-CM | POA: Insufficient documentation

## 2014-09-09 MED ORDER — CEPHALEXIN 500 MG PO CAPS: 500.0000 mg | ORAL_CAPSULE | Freq: Four times a day (QID) | ORAL | Status: DC

## 2014-09-09 MED ORDER — NAPROXEN 500 MG PO TABS: 500.0000 mg | ORAL_TABLET | Freq: Two times a day (BID) | ORAL | Status: DC

## 2014-09-09 NOTE — ED Provider Notes (Signed)
CSN: [id number]     Arrival date & time 09/09/14  2221 History  This chart was scribed for Johnna Acosta, MD by Tula Nakayama, ED Scribe. This patient was seen in room APA19/APA19 and the patient's care was started at 10:58 PM.    Chief Complaint  Patient presents with  . Finger Injury   The history is provided by the patient. No language interpreter was used.    HPI Comments: [name] is a 49 y.o. female who presents to the Emergency Department complaining of a constant left thumb injury that occurred 1 week ago and a right middle finger injury that started 1 month ago. Pt states she stuck a rusted wire into left thumb which she had removed from a nurse at work 24 hours later. She works with a Physiological scientist. Pt denies fevers and chills as associated symptoms.   Past Medical History  Diagnosis Date  . Hypertension    Past Surgical History  Procedure Laterality Date  . Eye surgery    . Tonsillectomy     Family History  Problem Relation Age of Onset  . Hypertension Mother    History  Substance Use Topics  . Smoking status: Current Every Day Smoker -- 0.50 packs/day    Types: Cigarettes  . Smokeless tobacco: Not on file  . Alcohol Use: Yes     Comment: occassional   OB History    Gravida Para Term Preterm AB TAB SAB Ectopic Multiple Living   1 1        1      Review of Systems  Constitutional: Negative for fever and chills.  Musculoskeletal: Positive for arthralgias. Negative for joint swelling.  Skin: Positive for wound.  All other systems reviewed and are negative.   Allergies  Review of patient's allergies indicates no known allergies.  Home Medications   Prior to Admission medications   Medication Sig Start Date End Date Taking? Authorizing Provider  cephALEXin (KEFLEX) 500 MG capsule Take 1 capsule (500 mg total) by mouth 4 (four) times daily. 09/09/14   Johnna Acosta, MD  diphenhydrAMINE (BENADRYL) 25 mg capsule Take 25 mg by mouth every 6 (six)  hours as needed for allergies.    Historical Provider, MD  ibuprofen (ADVIL,MOTRIN) 200 MG tablet Take 200 mg by mouth every 6 (six) hours as needed for moderate pain.    Historical Provider, MD  ibuprofen (ADVIL,MOTRIN) 600 MG tablet Take 1 tablet (600 mg total) by mouth every 6 (six) hours as needed. 04/29/14   Evalee Jefferson, PA-C  Multiple Vitamin (MULTIVITAMIN WITH MINERALS) TABS tablet Take 1 tablet by mouth daily.    Historical Provider, MD  naproxen (NAPROSYN) 500 MG tablet Take 1 tablet (500 mg total) by mouth 2 (two) times daily with a meal. 09/09/14   Johnna Acosta, MD   BP 148/101 mmHg  Pulse 79  Temp(Src) 98 F (36.7 C) (Oral)  Resp 20  Ht 5\' 3"  (1.6 m)  Wt 136 lb 14.4 oz (62.097 kg)  BMI 24.26 kg/m2  SpO2 100%  LMP 07/29/2014 (Approximate) Physical Exam  Constitutional: She appears well-developed and well-nourished.  HENT:  Head: Normocephalic and atraumatic.  Eyes: Conjunctivae are normal. Right eye exhibits no discharge. Left eye exhibits no discharge.  Pulmonary/Chest: Effort normal. No respiratory distress.  Musculoskeletal:  Tenderness and small sub-millimeter area of thickened and hardened skin on palmar surface of 3rd right finger DIP; pad of finger of left thumb  Neurological: She is alert. Coordination  normal.  Skin: Skin is warm and dry. No rash noted. She is not diaphoretic. No erythema.  Psychiatric: She has a normal mood and affect.  Nursing note and vitals reviewed.   ED Course  Procedures (including critical care time) DIAGNOSTIC STUDIES: Oxygen Saturation is 100% on RA, normal by my interpretation.    COORDINATION OF CARE: 11:03 PM Discussed treatment plan with pt at bedside and pt agreed to plan.  Labs Review Labs Reviewed - No data to display  Imaging Review No results found.    MDM   Final diagnoses:  Foreign body (FB) in soft tissue  Pain of finger, unspecified laterality    The patient has no foreign bodies on x-ray, I have personally  seen and interpreted the x-rays and find her to be no fractures, no foreign bodies, no significant soft tissue swelling. Vital signs are stable, she can be discharged on Naprosyn, Keflex for possible tiny early infection in the thumb.   Meds given in ED:  Medications - No data to display  New Prescriptions   CEPHALEXIN (KEFLEX) 500 MG CAPSULE    Take 1 capsule (500 mg total) by mouth 4 (four) times daily.   NAPROXEN (NAPROSYN) 500 MG TABLET    Take 1 tablet (500 mg total) by mouth 2 (two) times daily with a meal.    I personally performed the services described in this documentation, which was scribed in my presence. The recorded information has been reviewed and is accurate.      Johnna Acosta, MD 09/09/14 [phone]

## 2014-09-09 NOTE — Discharge Instructions (Signed)
Please call your doctor for a followup appointment within 24-48 hours. When you talk to your doctor please let them know that you were seen in the emergency department and have them acquire all of your records so that they can discuss the findings with you and formulate a treatment plan to fully care for your new and ongoing problems. ° °[facility] Doctor List ° ° ° °Edward Hawkins MD. Specialty: Pulmonary Disease Contact information: [address]  °[address]  °[place] [address]  °[phone]  ° °Margaret Simpson, MD. Specialty: Family Medicine Contact information: [address]  °[place] [address]  °[phone]  ° °Scott Luking, MD. Specialty: Family Medicine Contact information: [address]  °[address]  °[place] [address]  °[phone]  ° °Tesfaye Fanta, MD Specialty: Internal Medicine Contact information: [address]  °[place] [address]  °[phone]  ° °Zach Hall, MD. Specialty: Internal Medicine Contact information: [address]  °[place] [address]  °[phone]  ° °Angus Mcinnis, MD. Specialty: Family Medicine Contact information: [address]  °[place] [address]  °[phone]  ° °Stephen Knowlton, MD. Specialty: Family Medicine Contact information: [address]  °[address]  °[place] [address]  °[phone]  ° °Roy Fagan, MD. Specialty: Internal Medicine Contact information: [address]  °[address]  °[place] [address]  °[phone]  ° ° °

## 2014-09-09 NOTE — ED Notes (Signed)
Patient states she has to snip wires at work and thinks she got stuck by wires in left thumb and right middle finger.  Patient states thumb happened 1 week ago and right middle finger was over a month ago.

## 2014-11-16 ENCOUNTER — Encounter (HOSPITAL_COMMUNITY): Payer: Self-pay | Admitting: *Deleted

## 2014-11-16 ENCOUNTER — Emergency Department (HOSPITAL_COMMUNITY)
Admission: EM | Admit: 2014-11-16 | Discharge: 2014-11-17 | Disposition: A | Discharge status: Home / Self Care | Payer: BLUE CROSS/BLUE SHIELD | Attending: Emergency Medicine | Admitting: Emergency Medicine

## 2014-11-16 DIAGNOSIS — M542 Cervicalgia: Secondary | ICD-10-CM | POA: Diagnosis not present

## 2014-11-16 DIAGNOSIS — I10 Essential (primary) hypertension: Secondary | ICD-10-CM | POA: Diagnosis not present

## 2014-11-16 DIAGNOSIS — Z720 Tobacco use: Secondary | ICD-10-CM | POA: Insufficient documentation

## 2014-11-16 DIAGNOSIS — Z79899 Other long term (current) drug therapy: Secondary | ICD-10-CM | POA: Insufficient documentation

## 2014-11-16 DIAGNOSIS — Z792 Long term (current) use of antibiotics: Secondary | ICD-10-CM | POA: Insufficient documentation

## 2014-11-16 DIAGNOSIS — M7551 Bursitis of right shoulder: Secondary | ICD-10-CM | POA: Diagnosis not present

## 2014-11-16 DIAGNOSIS — M25511 Pain in right shoulder: Secondary | ICD-10-CM | POA: Diagnosis present

## 2014-11-16 DIAGNOSIS — Z791 Long term (current) use of non-steroidal anti-inflammatories (NSAID): Secondary | ICD-10-CM | POA: Diagnosis not present

## 2014-11-16 NOTE — ED Notes (Signed)
Pt reporting pain in right shoulder.  Reporting increased pain with movement.  States that she's had this pain for a few months, and it's progressively getting worse.

## 2014-11-17 MED ORDER — NAPROXEN 500 MG PO TABS: 500.0000 mg | ORAL_TABLET | Freq: Two times a day (BID) | ORAL | Status: DC

## 2014-11-17 MED ORDER — HYDROCODONE-ACETAMINOPHEN 5-325 MG PO TABS
1.0000 | ORAL_TABLET | Freq: Once | ORAL | Status: AC
  Administered 2014-11-17: 1 via ORAL
  Filled 2014-11-17: qty 1

## 2014-11-17 MED ORDER — CYCLOBENZAPRINE HCL 10 MG PO TABS
10.0000 mg | ORAL_TABLET | Freq: Once | ORAL | Status: AC
  Administered 2014-11-17: 10 mg via ORAL
  Filled 2014-11-17: qty 1

## 2014-11-17 MED ORDER — HYDROCODONE-ACETAMINOPHEN 5-325 MG PO TABS: ORAL_TABLET | ORAL | Status: DC

## 2014-11-17 NOTE — ED Provider Notes (Signed)
CSN: [id number]     Arrival date & time 11/16/14  2147 History   First MD Initiated Contact with Patient 11/17/14 0011     Chief Complaint  Patient presents with  . Shoulder Pain     (Consider location/radiation/quality/duration/timing/severity/associated sxs/prior Treatment) HPI  [name] is a 49 y.o. female who presents to the Emergency Department complaining of right shoulder pain for two months.  She states the pain has become worse for one week.  She states she has to perform repetitive movements that require lifting and twisting of her arms all day.  She describes an aching, sharp pain to her shoulder with any movement of the shoulder, especially over head movements. She also notes pain to the right neck at times, but resolves with ibuprofen.  She has has not tried any other therapies.   She denies headaches, weakness of the extremity, swelling, numbness or injury.     Past Medical History  Diagnosis Date  . Hypertension    Past Surgical History  Procedure Laterality Date  . Eye surgery    . Tonsillectomy     Family History  Problem Relation Age of Onset  . Hypertension Mother    History  Substance Use Topics  . Smoking status: Current Every Day Smoker -- 0.50 packs/day    Types: Cigarettes  . Smokeless tobacco: Not on file  . Alcohol Use: Yes     Comment: occassional   OB History    Gravida Para Term Preterm AB TAB SAB Ectopic Multiple Living   1 1        1      Review of Systems  Constitutional: Negative for fever and chills.  Eyes: Negative for visual disturbance.  Cardiovascular: Negative for chest pain.  Genitourinary: Negative for dysuria and difficulty urinating.  Musculoskeletal: Positive for arthralgias (right shoulder) and neck pain. Negative for joint swelling and neck stiffness.  Skin: Negative for color change and wound.  Neurological: Negative for dizziness, facial asymmetry, weakness, numbness and headaches.  All other systems reviewed  and are negative.     Allergies  Review of patient's allergies indicates no known allergies.  Home Medications   Prior to Admission medications   Medication Sig Start Date End Date Taking? Authorizing Provider  cephALEXin (KEFLEX) 500 MG capsule Take 1 capsule (500 mg total) by mouth 4 (four) times daily. 09/09/14   Noemi Chapel, MD  diphenhydrAMINE (BENADRYL) 25 mg capsule Take 25 mg by mouth every 6 (six) hours as needed for allergies.    Historical Provider, MD  ibuprofen (ADVIL,MOTRIN) 200 MG tablet Take 200 mg by mouth every 6 (six) hours as needed for moderate pain.    Historical Provider, MD  ibuprofen (ADVIL,MOTRIN) 600 MG tablet Take 1 tablet (600 mg total) by mouth every 6 (six) hours as needed. 04/29/14   Evalee Jefferson, PA-C  Multiple Vitamin (MULTIVITAMIN WITH MINERALS) TABS tablet Take 1 tablet by mouth daily.    Historical Provider, MD  naproxen (NAPROSYN) 500 MG tablet Take 1 tablet (500 mg total) by mouth 2 (two) times daily with a meal. 09/09/14   Noemi Chapel, MD   BP 147/95 mmHg  Pulse 66  Temp(Src) 98.8 F (37.1 C) (Oral)  Resp 18  Ht 5\' 3"  (1.6 m)  Wt 139 lb (63.05 kg)  BMI 24.63 kg/m2  SpO2 100%  LMP 11/02/2014 Physical Exam  Constitutional: She is oriented to person, place, and time. She appears well-developed and well-nourished. No distress.  HENT:  Head: Normocephalic  and atraumatic.  Eyes: EOM are normal. Pupils are equal, round, and reactive to light.  Neck: Normal range of motion. Neck supple. No thyromegaly present.  Mild tenderness of the right cervical paraspinal muscle.  No spinal tenderness.  Pt has full ROM   Cardiovascular: Normal rate, regular rhythm, normal heart sounds and intact distal pulses.   No murmur heard. Pulmonary/Chest: Effort normal and breath sounds normal. No respiratory distress. She exhibits no tenderness.  Musculoskeletal: She exhibits tenderness. She exhibits no edema.  ttp of the anterior right shoulder.  Pain with abduction of  the right arm and rotation of the shoulder.  Radial pulse is brisk, distal sensation intact, CR< 2 sec. Grip strength is strong and symmetrical.   No abrasions, edema , erythema or step-off deformity of the joint.   Lymphadenopathy:    She has no cervical adenopathy.  Neurological: She is alert and oriented to person, place, and time. She has normal strength. No sensory deficit. She exhibits normal muscle tone. Coordination normal.  Skin: Skin is warm and dry.  Nursing note and vitals reviewed.   ED Course  Procedures (including critical care time) Labs Review Labs Reviewed - No data to display  Imaging Review No results found.   EKG Interpretation None      MDM   Final diagnoses:  Bursitis, shoulder, right    Pain to the right shoulder with neuro or motor deficit.  Pain reproduced with abduction and rotation of the shoulder.  No concerning sx's for septic joint.  Likely inflammatory bursitis related to the repetitive movements of her job.  She agrees to ice, rest, and close orthopedic f/u.  She is well appearing and stable for d/c    Patrice Paradise, PA-C 11/17/14 0025  Rolland Porter, MD 11/17/14 [phone]

## 2014-11-17 NOTE — Discharge Instructions (Signed)
Bursitis °Bursitis is a swelling and soreness (inflammation) of a fluid-filled sac (bursa) that overlies and protects a joint. It can be caused by injury, overuse of the joint, arthritis or infection. The joints most likely to be affected are the elbows, shoulders, hips and knees. °HOME CARE INSTRUCTIONS  °· Apply ice to the affected area for 15-20 minutes each hour while awake for 2 days. Put the ice in a plastic bag and place a towel between the bag of ice and your skin. °· Rest the injured joint as much as possible, but continue to put the joint through a full range of motion, 4 times per day. (The shoulder joint especially becomes rapidly "frozen" if not used.) When the pain lessens, begin normal slow movements and usual activities. °· Only take over-the-counter or prescription medicines for pain, discomfort or fever as directed by your caregiver. °· Your caregiver may recommend draining the bursa and injecting medicine into the bursa. This may help the healing process. °· Follow all instructions for follow-up with your caregiver. This includes any orthopedic referrals, physical therapy and rehabilitation. Any delay in obtaining necessary care could result in a delay or failure of the bursitis to heal and chronic pain. °SEEK IMMEDIATE MEDICAL CARE IF:  °· Your pain increases even during treatment. °· You develop an oral temperature above 102° F (38.9° C) and have heat and inflammation over the involved bursa. °MAKE SURE YOU:  °· Understand these instructions. °· Will watch your condition. °· Will get help right away if you are not doing well or get worse. °Document Released: 08/11/2000 Document Revised: 11/06/2011 Document Reviewed: 11/03/2013 °[employer]® Patient Information ©2015 [employer]. This information is not intended to replace advice given to you by your health care provider. Make sure you discuss any questions you have with your health care provider. ° °

## 2014-12-10 ENCOUNTER — Other Ambulatory Visit (HOSPITAL_COMMUNITY): Payer: Self-pay | Admitting: Orthopaedic Surgery

## 2014-12-10 DIAGNOSIS — M25511 Pain in right shoulder: Secondary | ICD-10-CM

## 2014-12-15 ENCOUNTER — Ambulatory Visit (HOSPITAL_COMMUNITY)
Admission: RE | Admit: 2014-12-15 | Discharge: 2014-12-15 | Disposition: A | Discharge status: Home / Self Care | Payer: BLUE CROSS/BLUE SHIELD | Source: Ambulatory Visit | Attending: Orthopaedic Surgery | Admitting: Orthopaedic Surgery

## 2014-12-15 DIAGNOSIS — M25511 Pain in right shoulder: Secondary | ICD-10-CM

## 2015-09-27 ENCOUNTER — Encounter (HOSPITAL_COMMUNITY): Payer: Self-pay | Admitting: *Deleted

## 2015-09-27 ENCOUNTER — Emergency Department (HOSPITAL_COMMUNITY)
Admission: EM | Admit: 2015-09-27 | Discharge: 2015-09-27 | Disposition: A | Discharge status: Home / Self Care | Payer: BLUE CROSS/BLUE SHIELD | Attending: Emergency Medicine | Admitting: Emergency Medicine

## 2015-09-27 ENCOUNTER — Emergency Department (HOSPITAL_COMMUNITY): Payer: BLUE CROSS/BLUE SHIELD

## 2015-09-27 DIAGNOSIS — J018 Other acute sinusitis: Secondary | ICD-10-CM | POA: Insufficient documentation

## 2015-09-27 DIAGNOSIS — J069 Acute upper respiratory infection, unspecified: Secondary | ICD-10-CM | POA: Insufficient documentation

## 2015-09-27 DIAGNOSIS — R05 Cough: Secondary | ICD-10-CM | POA: Diagnosis present

## 2015-09-27 DIAGNOSIS — I10 Essential (primary) hypertension: Secondary | ICD-10-CM | POA: Diagnosis not present

## 2015-09-27 DIAGNOSIS — J329 Chronic sinusitis, unspecified: Secondary | ICD-10-CM

## 2015-09-27 DIAGNOSIS — Z79899 Other long term (current) drug therapy: Secondary | ICD-10-CM | POA: Diagnosis not present

## 2015-09-27 DIAGNOSIS — Z791 Long term (current) use of non-steroidal anti-inflammatories (NSAID): Secondary | ICD-10-CM | POA: Insufficient documentation

## 2015-09-27 DIAGNOSIS — F17210 Nicotine dependence, cigarettes, uncomplicated: Secondary | ICD-10-CM | POA: Insufficient documentation

## 2015-09-27 DIAGNOSIS — Z792 Long term (current) use of antibiotics: Secondary | ICD-10-CM | POA: Diagnosis not present

## 2015-09-27 MED ORDER — LORATADINE-PSEUDOEPHEDRINE ER 5-120 MG PO TB12: 1.0000 | ORAL_TABLET | Freq: Two times a day (BID) | ORAL | Status: DC

## 2015-09-27 MED ORDER — DEXAMETHASONE 4 MG PO TABS: 4.0000 mg | ORAL_TABLET | Freq: Two times a day (BID) | ORAL | Status: DC

## 2015-09-27 MED ORDER — AZITHROMYCIN 250 MG PO TABS
ORAL_TABLET | ORAL | Status: DC
Start: 2015-09-27 — End: 2015-10-05

## 2015-09-27 MED ORDER — IBUPROFEN 800 MG PO TABS
800.0000 mg | ORAL_TABLET | Freq: Once | ORAL | Status: AC
  Administered 2015-09-27: 800 mg via ORAL
  Filled 2015-09-27: qty 1

## 2015-09-27 MED ORDER — AZITHROMYCIN 250 MG PO TABS
500.0000 mg | ORAL_TABLET | Freq: Once | ORAL | Status: AC
  Administered 2015-09-27: 500 mg via ORAL
  Filled 2015-09-27: qty 2

## 2015-09-27 MED ORDER — ONDANSETRON HCL 4 MG PO TABS
4.0000 mg | ORAL_TABLET | Freq: Once | ORAL | Status: AC
  Administered 2015-09-27: 4 mg via ORAL
  Filled 2015-09-27: qty 1

## 2015-09-27 MED ORDER — AMOXICILLIN 250 MG PO CAPS
500.0000 mg | ORAL_CAPSULE | Freq: Once | ORAL | Status: AC
  Administered 2015-09-27: 500 mg via ORAL
  Filled 2015-09-27: qty 2

## 2015-09-27 NOTE — ED Notes (Signed)
Pt states she has been sick with URI symptoms since 08/21/15 and states the symptoms are intermittent; pt states she now has a cough

## 2015-09-27 NOTE — Discharge Instructions (Signed)
Sinusitis, Adult °Sinusitis is redness, soreness, and inflammation of the paranasal sinuses. Paranasal sinuses are air pockets within the bones of your face. They are located beneath your eyes, in the middle of your forehead, and above your eyes. In healthy paranasal sinuses, mucus is able to drain out, and air is able to circulate through them by way of your nose. However, when your paranasal sinuses are inflamed, mucus and air can become trapped. This can allow bacteria and other germs to grow and cause infection. °Sinusitis can develop quickly and last only a short time (acute) or continue over a long period (chronic). Sinusitis that lasts for more than 12 weeks is considered chronic. °CAUSES °Causes of sinusitis include: °· Allergies. °· Structural abnormalities, such as displacement of the cartilage that separates your nostrils (deviated septum), which can decrease the air flow through your nose and sinuses and affect sinus drainage. °· Functional abnormalities, such as when the small hairs (cilia) that line your sinuses and help remove mucus do not work properly or are not present. °SIGNS AND SYMPTOMS °Symptoms of acute and chronic sinusitis are the same. The primary symptoms are pain and pressure around the affected sinuses. Other symptoms include: °· Upper toothache. °· Earache. °· Headache. °· Bad breath. °· Decreased sense of smell and taste. °· A cough, which worsens when you are lying flat. °· Fatigue. °· Fever. °· Thick drainage from your nose, which often is green and may contain pus (purulent). °· Swelling and warmth over the affected sinuses. °DIAGNOSIS °Your health care provider will perform a physical exam. During your exam, your health care provider may perform any of the following to help determine if you have acute sinusitis or chronic sinusitis: °· Look in your nose for signs of abnormal growths in your nostrils (nasal polyps). °· Tap over the affected sinus to check for signs of  infection. °· View the inside of your sinuses using an imaging device that has a light attached (endoscope). °If your health care provider suspects that you have chronic sinusitis, one or more of the following tests may be recommended: °· Allergy tests. °· Nasal culture. A sample of mucus is taken from your nose, sent to a lab, and screened for bacteria. °· Nasal cytology. A sample of mucus is taken from your nose and examined by your health care provider to determine if your sinusitis is related to an allergy. °TREATMENT °Most cases of acute sinusitis are related to a viral infection and will resolve on their own within 10 days. Sometimes, medicines are prescribed to help relieve symptoms of both acute and chronic sinusitis. These may include pain medicines, decongestants, nasal steroid sprays, or saline sprays. °However, for sinusitis related to a bacterial infection, your health care provider will prescribe antibiotic medicines. These are medicines that will help kill the bacteria causing the infection. °Rarely, sinusitis is caused by a fungal infection. In these cases, your health care provider will prescribe antifungal medicine. °For some cases of chronic sinusitis, surgery is needed. Generally, these are cases in which sinusitis recurs more than 3 times per year, despite other treatments. °HOME CARE INSTRUCTIONS °· Drink plenty of water. Water helps thin the mucus so your sinuses can drain more easily. °· Use a humidifier. °· Inhale steam 3-4 times a day (for example, sit in the bathroom with the shower running). °· Apply a warm, moist washcloth to your face 3-4 times a day, or as directed by your health care provider. °· Use saline nasal sprays to help   moisten and clean your sinuses.  Take medicines only as directed by your health care provider.  If you were prescribed either an antibiotic or antifungal medicine, finish it all even if you start to feel better. SEEK IMMEDIATE MEDICAL CARE IF:  You have  increasing pain or severe headaches.  You have nausea, vomiting, or drowsiness.  You have swelling around your face.  You have vision problems.  You have a stiff neck.  You have difficulty breathing.   This information is not intended to replace advice given to you by your health care provider. Make sure you discuss any questions you have with your health care provider.   Document Released: 08/14/2005 Document Revised: 09/04/2014 Document Reviewed: 08/29/2011 Elsevier Interactive Patient Education 2016 [employer]  Upper Respiratory Infection, Adult Most upper respiratory infections (URIs) are caused by a virus. A URI affects the nose, throat, and upper air passages. The most common type of URI is often called "the common cold." HOME CARE   Take medicines only as told by your doctor.  Gargle warm saltwater or take cough drops to comfort your throat as told by your doctor.  Use a warm mist humidifier or inhale steam from a shower to increase air moisture. This may make it easier to breathe.  Drink enough fluid to keep your pee (urine) clear or pale yellow.  Eat soups and other clear broths.  Have a healthy diet.  Rest as needed.  Go back to work when your fever is gone or your doctor says it is okay.  You may need to stay home longer to avoid giving your URI to others.  You can also wear a face mask and wash your hands often to prevent spread of the virus.  Use your inhaler more if you have asthma.  Do not use any tobacco products, including cigarettes, chewing tobacco, or electronic cigarettes. If you need help quitting, ask your doctor. GET HELP IF:  You are getting worse, not better.  Your symptoms are not helped by medicine.  You have chills.  You are getting more short of breath.  You have brown or red mucus.  You have yellow or brown discharge from your nose.  You have pain in your face, especially when you bend forward.  You have a fever.  You  have puffy (swollen) neck glands.  You have pain while swallowing.  You have white areas in the back of your throat. GET HELP RIGHT AWAY IF:   You have very bad or constant:  Headache.  Ear pain.  Pain in your forehead, behind your eyes, and over your cheekbones (sinus pain).  Chest pain.  You have long-lasting (chronic) lung disease and any of the following:  Wheezing.  Long-lasting cough.  Coughing up blood.  A change in your usual mucus.  You have a stiff neck.  You have changes in your:  Vision.  Hearing.  Thinking.  Mood. MAKE SURE YOU:   Understand these instructions.  Will watch your condition.  Will get help right away if you are not doing well or get worse.   This information is not intended to replace advice given to you by your health care provider. Make sure you discuss any questions you have with your health care provider.   Document Released: 01/31/2008 Document Revised: 12/29/2014 Document Reviewed: 11/19/2013 Elsevier Interactive Patient Education [employer].

## 2015-09-27 NOTE — ED Provider Notes (Signed)
CSN: [id number]     Arrival date & time 09/27/15  1857 History  By signing my name below, I, Hilda Lias, attest that this documentation has been prepared under the direction and in the presence of Lily Kocher, PA-C.  Electronically Signed: Hilda Lias, ED Scribe. 09/27/2015. 8:03 PM.    Chief Complaint  Patient presents with  . URI      Patient is a 50 y.o. female presenting with URI. The history is provided by the patient. No language interpreter was used.  URI Presenting symptoms: congestion, cough and sore throat   Presenting symptoms: no fever     HPI Comments: [name] is a 50 y.o. female who presents to the Emergency Department complaining of symptoms of a URi that have been present since 08/21/15. Pt reports today with constant productive cough, congestion, sinus pressure, and sore throat that has been present since her symptoms began. Pt states she took medications for week and a half at the onset of her symptoms and states that all of her symptoms except for the cough went away. Pt reports the cough came back and it has been constant since then. Pt denies using anything to treat her symptoms after that period of time. Pt denies fever, chills.   Past Medical History  Diagnosis Date  . Hypertension    Past Surgical History  Procedure Laterality Date  . Eye surgery    . Tonsillectomy     Family History  Problem Relation Age of Onset  . Hypertension Mother    Social History  Substance Use Topics  . Smoking status: Current Every Day Smoker -- 0.50 packs/day    Types: Cigarettes  . Smokeless tobacco: None  . Alcohol Use: Yes     Comment: occassional   OB History    Gravida Para Term Preterm AB TAB SAB Ectopic Multiple Living   1 1        1      Review of Systems  Constitutional: Negative for fever and chills.  HENT: Positive for congestion, sinus pressure and sore throat.   Respiratory: Positive for cough.   All other systems reviewed and are  negative.     Allergies  Review of patient's allergies indicates no known allergies.  Home Medications   Prior to Admission medications   Medication Sig Start Date End Date Taking? Authorizing Provider  cephALEXin (KEFLEX) 500 MG capsule Take 1 capsule (500 mg total) by mouth 4 (four) times daily. 09/09/14   Noemi Chapel, MD  diphenhydrAMINE (BENADRYL) 25 mg capsule Take 25 mg by mouth every 6 (six) hours as needed for allergies.    Historical Provider, MD  HYDROcodone-acetaminophen (NORCO/VICODIN) 5-325 MG per tablet Take one-two tabs po q 4-6 hrs prn pain 11/17/14   Tammy Triplett, PA-C  ibuprofen (ADVIL,MOTRIN) 200 MG tablet Take 200 mg by mouth every 6 (six) hours as needed for moderate pain.    Historical Provider, MD  ibuprofen (ADVIL,MOTRIN) 600 MG tablet Take 1 tablet (600 mg total) by mouth every 6 (six) hours as needed. 04/29/14   Evalee Jefferson, PA-C  Multiple Vitamin (MULTIVITAMIN WITH MINERALS) TABS tablet Take 1 tablet by mouth daily.    Historical Provider, MD  naproxen (NAPROSYN) 500 MG tablet Take 1 tablet (500 mg total) by mouth 2 (two) times daily with a meal. 11/17/14   Tammy Triplett, PA-C   BP 148/113 mmHg  Pulse 85  Temp(Src) 98.4 F (36.9 C) (Tympanic)  Resp 14  Ht 5\' 3"  (1.6 m)  Wt 141 lb (63.957 kg)  BMI 24.98 kg/m2  SpO2 100%  LMP  (LMP Unknown) Physical Exam  Constitutional: She is oriented to person, place, and time. She appears well-developed and well-nourished.  HENT:  Head: Normocephalic and atraumatic.  No facial asymetry No temperature asymmetry of face Nasal congestion present Mild increased redness of posterior pharynx Uvula is midline  Neck: Normal range of motion.  Cardiovascular: Normal rate.   Pulmonary/Chest: Effort normal and breath sounds normal. No respiratory distress. She has no wheezes. She has no rales. She exhibits no tenderness.  Abdominal: She exhibits no distension.  Musculoskeletal:  No extremity edema   Lymphadenopathy:    She  has no cervical adenopathy.  Neurological: She is alert and oriented to person, place, and time.  Skin: Skin is warm and dry.  Psychiatric: She has a normal mood and affect.  Nursing note and vitals reviewed.   ED Course    Procedures (including critical care time)  DIAGNOSTIC STUDIES: Oxygen Saturation is 100% on room air, normal by my interpretation.    COORDINATION OF CARE: 8:00 PM Discussed treatment plan with pt at bedside and pt agreed to plan.   Labs Review Labs Reviewed - No data to display  Imaging Review Dg Chest 2 View  09/27/2015  CLINICAL DATA:  Productive cough with chest congestion, sinus congestion and wheezing for 6 weeks. Smoker. EXAM: CHEST  2 VIEW COMPARISON:  Chest radiographs 01/08/2014. FINDINGS: The heart size and mediastinal contours are stable. There is mild central airway thickening without hyperinflation, confluent airspace opacity, pleural effusion or pneumothorax. The bones appear unremarkable. IMPRESSION: Mild central airway thickening consistent with bronchitis. No evidence of pneumonia. Electronically Signed   By: Richardean Sale M.D.   On: 09/27/2015 19:38   I have personally reviewed and evaluated these images and lab results as part of my medical decision-making.   EKG Interpretation None      MDM Chest x-ray reveals mild central airway thickening consistent with bronchitis. The examination suggest sinusitis and upper respiratory infection. Vital signs reviewed. Pulse oximetry is 100%. Patient speaks in complete sentences without problem. Patient will be placed on Claritin-D, Decadron, Zithromax,. Patient advised to increase fluids, wash hands frequently. She will use Tylenol every 4 hours, or ibuprofen every 6 hours for fever or aching. Patient acknowledges understanding of these discharge instructions.    Final diagnoses:  None    **I have reviewed nursing notes, vital signs, and all appropriate lab and imaging results for this  patient.*  **I personally performed the services described in this documentation, which was scribed in my presence. The recorded information has been reviewed and is accurate.Lily Kocher, PA-C 09/28/15 2023  Ezequiel Essex, MD 09/28/15 [phone]

## 2015-10-05 ENCOUNTER — Ambulatory Visit (INDEPENDENT_AMBULATORY_CARE_PROVIDER_SITE_OTHER): Payer: BLUE CROSS/BLUE SHIELD | Admitting: Orthopaedic Surgery

## 2015-10-05 ENCOUNTER — Ambulatory Visit (INDEPENDENT_AMBULATORY_CARE_PROVIDER_SITE_OTHER): Payer: BLUE CROSS/BLUE SHIELD

## 2015-10-05 ENCOUNTER — Encounter: Payer: Self-pay | Admitting: Orthopaedic Surgery

## 2015-10-05 VITALS — BP 120/84 | HR 94 | Ht 63.0 in | Wt 144.6 lb

## 2015-10-05 DIAGNOSIS — M25511 Pain in right shoulder: Secondary | ICD-10-CM | POA: Diagnosis not present

## 2015-10-05 MED ORDER — NAPROXEN 500 MG PO TABS: 500.0000 mg | ORAL_TABLET | Freq: Two times a day (BID) | ORAL | Status: DC

## 2015-10-05 MED ORDER — HYDROCODONE-ACETAMINOPHEN 5-325 MG PO TABS: 1.0000 | ORAL_TABLET | ORAL | Status: DC | PRN

## 2015-10-05 MED ORDER — HYDROCODONE-ACETAMINOPHEN 7.5-325 MG PO TABS: 1.0000 | ORAL_TABLET | ORAL | Status: DC | PRN

## 2015-10-05 NOTE — Patient Instructions (Signed)
Resume her shoulder exercises.  Resume her Naprosyn.  Use Aspracream rub on the shoulder as needed.

## 2015-10-05 NOTE — Progress Notes (Signed)
Patient [id number] [name], female DOB:10/02/1965, 50 y.o. [id number]  Chief Complaint  Patient presents with  . Shoulder Pain    Right shoulder pain, starting hurting again and "pops"    HPI  [name] is a 50 y.o. female who was last seen in August of 2016.  She has gradually worsening pain of the right shoulder and made worse with recent cold weather. She has no trauma.  She has no redness or paresthesias.   Shoulder Pain  The pain is present in the right shoulder. This is a chronic problem. The current episode started more than 1 year ago. There has been no history of extremity trauma. The problem occurs daily. The problem has been gradually worsening. The quality of the pain is described as aching and dull. The pain is at a severity of 4/10. The pain is mild. The symptoms are aggravated by activity and cold. She has tried acetaminophen, heat, cold and NSAIDS for the symptoms. The treatment provided mild relief.    Review of Systems Review of Systems  Cardiovascular:       History of hypertension.  All other systems reviewed and are negative.   Past Medical History  Diagnosis Date  . Hypertension     Past Surgical History  Procedure Laterality Date  . Eye surgery    . Tonsillectomy      Family History  Problem Relation Age of Onset  . Hypertension Mother     Social History Social History  Substance Use Topics  . Smoking status: Current Every Day Smoker -- 0.50 packs/day    Types: Cigarettes  . Smokeless tobacco: None  . Alcohol Use: Yes     Comment: occassional    No Known Allergies  Current Outpatient Prescriptions  Medication Sig Dispense Refill  . Multiple Vitamin (MULTIVITAMIN WITH MINERALS) TABS tablet Take 1 tablet by mouth daily.    Marland Kitchen dexamethasone (DECADRON) 4 MG tablet Take 1 tablet (4 mg total) by mouth 2 (two) times daily with a meal. (Patient not taking: Reported on 10/05/2015) 12 tablet 0  . diphenhydrAMINE (BENADRYL) 25 mg capsule  Take 25 mg by mouth every 6 (six) hours as needed for allergies. Reported on 10/05/2015    . HYDROcodone-acetaminophen (NORCO) 7.5-325 MG tablet Take 1 tablet by mouth every 4 (four) hours as needed for moderate pain. 120 tablet 0  . loratadine-pseudoephedrine (CLARITIN-D 12 HOUR) 5-120 MG tablet Take 1 tablet by mouth 2 (two) times daily. (Patient not taking: Reported on 10/05/2015) 20 tablet 0  . naproxen (NAPROSYN) 500 MG tablet Take 1 tablet (500 mg total) by mouth 2 (two) times daily with a meal. 60 tablet 5   No current facility-administered medications for this visit.     Physical Exam  Blood pressure 120/84, pulse 94, height 5\' 3"  (1.6 m), weight 144 lb 9.6 oz (65.59 kg).  Constitutional: overall normal hygiene, normal nutrition, well developed, normal grooming, normal body habitus. Assistive device:none  Musculoskeletal: gait and station Limp none, muscle tone and strength are normal, no tremors or atrophy is present.  .  Neurological: coordination overall normal.  Deep tendon reflex/nerve stretch intact.  Sensation normal.  Cranial nerves II-XII intact.   Skin:normal overall no scars, lesions, ulcers or rash es. No psoriasis.  Psychiatric: Alert and oriented x 3.  Recent memory intact, remote memory unclear.  Normal mood and affect. Well groomed.  Good eye contact.  Cardiovascular: overall no swelling, no varicosities, no edema bilaterally, normal temperatures of the legs and  arms, no clubbing, cyanosis and good capillary refill.  Lymphatic: palpation is normal.  Encounter Diagnosis  Name Primary?  . Right shoulder pain Yes    Right shoulder Extremities:right shoulder is tender in overhead motions. Inspection she has glenohumeral joint tenderness with no crepitus.  NV is intact.  Grips are normal. Strength and tone normal Range of motion full but tender in overhead positioning and with resisted abduction.  Additional services performed: x-rays of the right shoulder were  done showing humeral head located in glenoid properly, no fracture or loose body seen and bone quality is normal. IMPRESSION:  Negative right shoulder exam by x-ray.  PLAN Call if any problems.  Precautions discussed.  Continue current medications. She is resume her Naprosyn which she stopped taking a few months ago.   Return to clinic three months.

## 2015-11-16 ENCOUNTER — Other Ambulatory Visit: Payer: Self-pay | Admitting: Orthopaedic Surgery

## 2015-11-16 DIAGNOSIS — M25511 Pain in right shoulder: Secondary | ICD-10-CM

## 2015-12-02 DIAGNOSIS — R85610 Atypical squamous cells of undetermined significance on cytologic smear of anus (ASC-US): Secondary | ICD-10-CM | POA: Diagnosis not present

## 2015-12-02 DIAGNOSIS — R87619 Unspecified abnormal cytological findings in specimens from cervix uteri: Secondary | ICD-10-CM | POA: Diagnosis not present

## 2015-12-02 DIAGNOSIS — R87612 Low grade squamous intraepithelial lesion on cytologic smear of cervix (LGSIL): Secondary | ICD-10-CM | POA: Diagnosis not present

## 2015-12-02 DIAGNOSIS — Z6826 Body mass index (BMI) 26.0-26.9, adult: Secondary | ICD-10-CM | POA: Diagnosis not present

## 2015-12-02 DIAGNOSIS — Z1389 Encounter for screening for other disorder: Secondary | ICD-10-CM | POA: Diagnosis not present

## 2015-12-14 ENCOUNTER — Other Ambulatory Visit (HOSPITAL_COMMUNITY): Payer: Self-pay | Admitting: Family Medicine

## 2015-12-14 DIAGNOSIS — Z1231 Encounter for screening mammogram for malignant neoplasm of breast: Secondary | ICD-10-CM

## 2015-12-17 ENCOUNTER — Ambulatory Visit (HOSPITAL_COMMUNITY): Payer: BLUE CROSS/BLUE SHIELD

## 2015-12-20 ENCOUNTER — Ambulatory Visit (HOSPITAL_COMMUNITY)
Admission: RE | Admit: 2015-12-20 | Discharge: 2015-12-20 | Disposition: A | Discharge status: Home / Self Care | Payer: BLUE CROSS/BLUE SHIELD | Source: Ambulatory Visit | Attending: Family Medicine | Admitting: Family Medicine

## 2015-12-20 ENCOUNTER — Ambulatory Visit (HOSPITAL_COMMUNITY): Payer: BLUE CROSS/BLUE SHIELD

## 2015-12-20 DIAGNOSIS — Z1231 Encounter for screening mammogram for malignant neoplasm of breast: Secondary | ICD-10-CM

## 2016-01-04 ENCOUNTER — Encounter: Payer: Self-pay | Admitting: Orthopaedic Surgery

## 2016-01-04 ENCOUNTER — Ambulatory Visit (INDEPENDENT_AMBULATORY_CARE_PROVIDER_SITE_OTHER): Payer: BLUE CROSS/BLUE SHIELD | Admitting: Orthopaedic Surgery

## 2016-01-04 VITALS — BP 132/92 | HR 72 | Temp 97.7°F | Resp 16 | Ht 63.0 in | Wt 146.0 lb

## 2016-01-04 DIAGNOSIS — M25511 Pain in right shoulder: Secondary | ICD-10-CM

## 2016-01-04 MED ORDER — HYDROCODONE-ACETAMINOPHEN 7.5-325 MG PO TABS: 1.0000 | ORAL_TABLET | ORAL | Status: DC | PRN

## 2016-01-04 NOTE — Progress Notes (Signed)
Patient [id number] [name], female DOB:04/10/1966, 50 y.o. [id number]  CC:  Right shoulder pain  HPI  [name] is a 50 y.o. female who has recurrence of shoulder pain on the right.  She was seen here in February.  She was given Naprosyn to take.  She took it about two weeks, felt better and stopped it.  She has no numbness.  She has pain with overhead use.  She has tired ice with little help.  She has no new trauma.  She would like to go to PT.  HPI  Body mass index is 25.87 kg/(m^2).  Review of Systems  HENT: Negative for congestion.   Respiratory: Negative for cough and shortness of breath.   Cardiovascular: Negative for chest pain and leg swelling.       History of hypertension.  Endocrine: Positive for cold intolerance.  Musculoskeletal: Positive for arthralgias.  Allergic/Immunologic: Positive for environmental allergies.  All other systems reviewed and are negative.   Past Medical History  Diagnosis Date  . Hypertension     Past Surgical History  Procedure Laterality Date  . Eye surgery    . Tonsillectomy      Family History  Problem Relation Age of Onset  . Hypertension Mother     Social History Social History  Substance Use Topics  . Smoking status: Current Every Day Smoker -- 0.50 packs/day    Types: Cigarettes  . Smokeless tobacco: None  . Alcohol Use: Yes     Comment: occassional    No Known Allergies  Current Outpatient Prescriptions  Medication Sig Dispense Refill  . amLODipine (NORVASC) 5 MG tablet Take 5 mg by mouth daily.    . Cholecalciferol (VITAMIN D PO) Take by mouth.    . diphenhydrAMINE (BENADRYL) 25 mg capsule Take 25 mg by mouth every 6 (six) hours as needed for allergies. Reported on 10/05/2015    . HYDROcodone-acetaminophen (NORCO) 7.5-325 MG tablet Take 1 tablet by mouth every 4 (four) hours as needed for moderate pain. 120 tablet 0  . Multiple Vitamin (MULTIVITAMIN WITH MINERALS) TABS tablet Take 1 tablet by mouth  daily.    . naproxen (NAPROSYN) 500 MG tablet Take 1 tablet (500 mg total) by mouth 2 (two) times daily with a meal. 60 tablet 5   No current facility-administered medications for this visit.     Physical Exam  Blood pressure 132/92, pulse 72, temperature 97.7 F (36.5 C), resp. rate 16, height 5\' 3"  (1.6 m), weight 146 lb (66.225 kg), last menstrual period 12/06/2015.  Constitutional: overall normal hygiene, normal nutrition, well developed, normal grooming, normal body habitus. Assistive device:none  Musculoskeletal: gait and station Limp none, muscle tone and strength are normal, no tremors or atrophy is present.  .  Neurological: coordination overall normal.  Deep tendon reflex/nerve stretch intact.  Sensation normal.  Cranial nerves II-XII intact.   Skin:   normal overall no scars, lesions, ulcers or rashes. No psoriasis.  Psychiatric: Alert and oriented x 3.  Recent memory intact, remote memory unclear.  Normal mood and affect. Well groomed.  Good eye contact.  Cardiovascular: overall no swelling, no varicosities, no edema bilaterally, normal temperatures of the legs and arms, no clubbing, cyanosis and good capillary refill.  Lymphatic: palpation is normal.  Examination of right Upper Extremity is done.  Inspection:   Overall:  Elbow non-tender without crepitus or defects, forearm non-tender without crepitus or defects, wrist non-tender without crepitus or defects, hand non-tender.    Shoulder: with glenohumeral  joint tenderness, without effusion.   Upper arm: without swelling and tenderness   Range of motion:   Overall:  Full range of motion of the elbow, full range of motion of wrist and full range of motion in fingers.   Shoulder:  right  145 degrees forward flexion; 100 degrees abduction; 30 degrees internal rotation, 30 degrees external rotation, 15 degrees extension, 40 degrees adduction.   Stability:   Overall:  Shoulder, elbow and wrist stable   Strength and  Tone:   Overall full shoulder muscles strength, full upper arm strength and normal upper arm bulk and tone.  Left shoulder normal.  Neck normal.  The patient has been educated about the nature of the problem(s) and counseled on treatment options.  The patient appeared to understand what I have discussed and is in agreement with it.  Encounter Diagnosis  Name Primary?  . Right shoulder pain Yes    PLAN Call if any problems.  Precautions discussed.  Continue current medications.   Return to clinic 2 weeks   Begin OT.  Stay out of work.  Resume the Naprosyn daily.

## 2016-01-04 NOTE — Patient Instructions (Signed)
Begin OT.  OUT of Work

## 2016-01-10 DIAGNOSIS — M25511 Pain in right shoulder: Secondary | ICD-10-CM | POA: Diagnosis not present

## 2016-01-10 DIAGNOSIS — R293 Abnormal posture: Secondary | ICD-10-CM | POA: Diagnosis not present

## 2016-01-10 DIAGNOSIS — M6281 Muscle weakness (generalized): Secondary | ICD-10-CM | POA: Diagnosis not present

## 2016-01-10 DIAGNOSIS — M25611 Stiffness of right shoulder, not elsewhere classified: Secondary | ICD-10-CM | POA: Diagnosis not present

## 2016-01-11 DIAGNOSIS — M25511 Pain in right shoulder: Secondary | ICD-10-CM | POA: Diagnosis not present

## 2016-01-11 DIAGNOSIS — M25611 Stiffness of right shoulder, not elsewhere classified: Secondary | ICD-10-CM | POA: Diagnosis not present

## 2016-01-11 DIAGNOSIS — M6281 Muscle weakness (generalized): Secondary | ICD-10-CM | POA: Diagnosis not present

## 2016-01-11 DIAGNOSIS — R293 Abnormal posture: Secondary | ICD-10-CM | POA: Diagnosis not present

## 2016-01-13 ENCOUNTER — Encounter: Payer: Self-pay | Admitting: Obstetrics and Gynecology

## 2016-01-13 ENCOUNTER — Other Ambulatory Visit: Payer: Self-pay | Admitting: Obstetrics and Gynecology

## 2016-01-13 ENCOUNTER — Ambulatory Visit (INDEPENDENT_AMBULATORY_CARE_PROVIDER_SITE_OTHER): Payer: BLUE CROSS/BLUE SHIELD | Admitting: Obstetrics and Gynecology

## 2016-01-13 VITALS — BP 120/60 | Ht 63.0 in | Wt 148.0 lb

## 2016-01-13 DIAGNOSIS — M6281 Muscle weakness (generalized): Secondary | ICD-10-CM | POA: Diagnosis not present

## 2016-01-13 DIAGNOSIS — R293 Abnormal posture: Secondary | ICD-10-CM | POA: Diagnosis not present

## 2016-01-13 DIAGNOSIS — M25511 Pain in right shoulder: Secondary | ICD-10-CM | POA: Diagnosis not present

## 2016-01-13 DIAGNOSIS — R87612 Low grade squamous intraepithelial lesion on cytologic smear of cervix (LGSIL): Secondary | ICD-10-CM | POA: Diagnosis not present

## 2016-01-13 DIAGNOSIS — N870 Mild cervical dysplasia: Secondary | ICD-10-CM

## 2016-01-13 DIAGNOSIS — M25611 Stiffness of right shoulder, not elsewhere classified: Secondary | ICD-10-CM | POA: Diagnosis not present

## 2016-01-13 NOTE — Progress Notes (Signed)
Patient ID: [name], female   DOB: 11-Dec-1965, 50 y.o.   MRN: [id number]   [name] 50 y.o. 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 Patient states she is on hypertension medication qd. She denies a history of abnormal pap smears.   Discussed role for HPV in cervical dysplasia, need for surveillance.   Patient given informed consent, signed copy in the chart, time out was performed.  Placed in lithotomy position. Cervix viewed with speculum and colposcope after application of acetic acid.   Colposcopy adequate? Yes   2 small areas of white punctation; biopsies obtained at 1 o'clock and 6 o' clock.  ECC specimen obtained All specimens were labelled and sent to pathology. Monsel's solution applied to cervix.   Colposcopy IMPRESSION: CIN I. Patient was given post procedure instructions. Results by MyChart in 1 week. Will follow up pathology and manage accordingly.  Routine preventative health maintenance measures emphasized.    By signing my name below, I, Stephania Fragmin, attest that this documentation has been prepared under the direction and in the presence of Jonnie Kind, MD. Electronically Signed: Stephania Fragmin, ED Scribe. 01/13/2016. 4:10 PM.  I personally performed the services described in this documentation, which was SCRIBED in my presence. The recorded information has been reviewed and considered accurate. It has been edited as necessary during review. Jonnie Kind, MD

## 2016-01-17 DIAGNOSIS — M6281 Muscle weakness (generalized): Secondary | ICD-10-CM | POA: Diagnosis not present

## 2016-01-17 DIAGNOSIS — R293 Abnormal posture: Secondary | ICD-10-CM | POA: Diagnosis not present

## 2016-01-17 DIAGNOSIS — M25611 Stiffness of right shoulder, not elsewhere classified: Secondary | ICD-10-CM | POA: Diagnosis not present

## 2016-01-17 DIAGNOSIS — M25511 Pain in right shoulder: Secondary | ICD-10-CM | POA: Diagnosis not present

## 2016-01-18 ENCOUNTER — Encounter: Payer: Self-pay | Admitting: Orthopaedic Surgery

## 2016-01-18 ENCOUNTER — Ambulatory Visit (INDEPENDENT_AMBULATORY_CARE_PROVIDER_SITE_OTHER): Payer: BLUE CROSS/BLUE SHIELD | Admitting: Orthopaedic Surgery

## 2016-01-18 VITALS — BP 126/91 | HR 76 | Temp 97.7°F | Ht 63.0 in | Wt 148.0 lb

## 2016-01-18 DIAGNOSIS — M25519 Pain in unspecified shoulder: Secondary | ICD-10-CM | POA: Insufficient documentation

## 2016-01-18 DIAGNOSIS — M19071 Primary osteoarthritis, right ankle and foot: Secondary | ICD-10-CM | POA: Diagnosis not present

## 2016-01-18 DIAGNOSIS — M25511 Pain in right shoulder: Secondary | ICD-10-CM

## 2016-01-18 NOTE — Patient Instructions (Signed)
Continue therapy.  Out of work.

## 2016-01-18 NOTE — Progress Notes (Signed)
Patient [name], female DOB:01/08/1966, 50 y.o. [id number]  Chief Complaint  Patient presents with  . Follow-up    2 week follow up right shoulder    HPI  [name] is a 50 y.o. female who is seen for right shoulder pain.  She is going to OT and is making good progress.  She still needs more OT.  She has no new trauma, no redness, no paresthesias.  HPI  Body mass index is 26.22 kg/(m^2).  ROS  Review of Systems  HENT: Negative for congestion.   Respiratory: Negative for cough and shortness of breath.   Cardiovascular: Negative for chest pain and leg swelling.       History of hypertension.  Endocrine: Positive for cold intolerance.  Musculoskeletal: Positive for arthralgias.  Allergic/Immunologic: Positive for environmental allergies.  All other systems reviewed and are negative.   Past Medical History  Diagnosis Date  . Hypertension   . Vaginal Pap smear, abnormal     Past Surgical History  Procedure Laterality Date  . Eye surgery    . Tonsillectomy      Family History  Problem Relation Age of Onset  . Hypertension Mother     Social History Social History  Substance Use Topics  . Smoking status: Current Every Day Smoker -- 0.50 packs/day for 30 years    Types: Cigarettes  . Smokeless tobacco: Never Used  . Alcohol Use: Yes     Comment: occassional    No Known Allergies  Current Outpatient Prescriptions  Medication Sig Dispense Refill  . amLODipine (NORVASC) 5 MG tablet Take 5 mg by mouth daily.    . Cholecalciferol (VITAMIN D PO) Take by mouth.    Marland Kitchen HYDROcodone-acetaminophen (NORCO) 7.5-325 MG tablet Take 1 tablet by mouth every 4 (four) hours as needed for moderate pain. 120 tablet 0  . Multiple Vitamin (MULTIVITAMIN WITH MINERALS) TABS tablet Take 1 tablet by mouth daily.    . naproxen (NAPROSYN) 500 MG tablet Take 1 tablet (500 mg total) by mouth 2 (two) times daily with a meal. 60 tablet 5   No current  facility-administered medications for this visit.     Physical Exam  Blood pressure 126/91, pulse 76, temperature 97.7 F (36.5 C), height 5\' 3"  (1.6 m), weight 148 lb (67.132 kg), last menstrual period 12/06/2015.  Constitutional: overall normal hygiene, normal nutrition, well developed, normal grooming, normal body habitus. Assistive device:none  Musculoskeletal: gait and station Limp none, muscle tone and strength are normal, no tremors or atrophy is present.  .  Neurological: coordination overall normal.  Deep tendon reflex/nerve stretch intact.  Sensation normal.  Cranial nerves II-XII intact.   Skin:   full overall no scars, lesions, ulcers or rashes. No psoriasis.  Psychiatric: Alert and oriented x 3.  Recent memory intact, remote memory unclear.  Normal mood and affect. Well groomed.  Good eye contact.  Cardiovascular: overall no swelling, no varicosities, no edema bilaterally, normal temperatures of the legs and arms, no clubbing, cyanosis and good capillary refill.  Lymphatic: palpation is normal.  Examination of right Upper Extremity is done.  Inspection:   Overall:  Elbow non-tender without crepitus or defects, forearm non-tender without crepitus or defects, wrist non-tender without crepitus or defects, hand non-tender.    Shoulder: with glenohumeral joint tenderness, without effusion.   Upper arm: without swelling and tenderness   Range of motion:   Overall:  Full range of motion of the elbow, full range of motion of wrist and  full range of motion in fingers.   Shoulder:  right  170 degrees forward flexion; 160 degrees abduction; 35 degrees internal rotation, 35 degrees external rotation, 20 degrees extension, 40 degrees adduction.   Stability:   Overall:  Shoulder, elbow and wrist stable   Strength and Tone:   Overall full shoulder muscles strength, full upper arm strength and normal upper arm bulk and tone.  The patient has been educated about the nature of the  problem(s) and counseled on treatment options.  The patient appeared to understand what I have discussed and is in agreement with it.  Encounter Diagnoses  Name Primary?  . Right shoulder pain Yes  . Primary osteoarthritis of right foot     PLAN Call if any problems.  Precautions discussed.  Continue current medications.   Return to clinic 2 weeks  Stay out of work.  Continue OT.

## 2016-01-25 DIAGNOSIS — R293 Abnormal posture: Secondary | ICD-10-CM | POA: Diagnosis not present

## 2016-01-25 DIAGNOSIS — M25511 Pain in right shoulder: Secondary | ICD-10-CM | POA: Diagnosis not present

## 2016-01-25 DIAGNOSIS — M25611 Stiffness of right shoulder, not elsewhere classified: Secondary | ICD-10-CM | POA: Diagnosis not present

## 2016-01-25 DIAGNOSIS — M6281 Muscle weakness (generalized): Secondary | ICD-10-CM | POA: Diagnosis not present

## 2016-01-31 DIAGNOSIS — M6281 Muscle weakness (generalized): Secondary | ICD-10-CM | POA: Diagnosis not present

## 2016-01-31 DIAGNOSIS — M25511 Pain in right shoulder: Secondary | ICD-10-CM | POA: Diagnosis not present

## 2016-01-31 DIAGNOSIS — M25611 Stiffness of right shoulder, not elsewhere classified: Secondary | ICD-10-CM | POA: Diagnosis not present

## 2016-01-31 DIAGNOSIS — R293 Abnormal posture: Secondary | ICD-10-CM | POA: Diagnosis not present

## 2016-02-01 ENCOUNTER — Encounter: Payer: Self-pay | Admitting: Orthopaedic Surgery

## 2016-02-01 ENCOUNTER — Ambulatory Visit (INDEPENDENT_AMBULATORY_CARE_PROVIDER_SITE_OTHER): Payer: BLUE CROSS/BLUE SHIELD | Admitting: Orthopaedic Surgery

## 2016-02-01 VITALS — BP 127/88 | HR 92 | Resp 16 | Ht 63.5 in | Wt 149.0 lb

## 2016-02-01 DIAGNOSIS — M67479 Ganglion, unspecified ankle and foot: Secondary | ICD-10-CM | POA: Diagnosis not present

## 2016-02-01 DIAGNOSIS — M25511 Pain in right shoulder: Secondary | ICD-10-CM | POA: Diagnosis not present

## 2016-02-01 NOTE — Patient Instructions (Signed)
Out of work 

## 2016-02-01 NOTE — Progress Notes (Signed)
Patient [name]:[id number] JAQULYN SKURKA, female DOB:[birth year], 50 y.o. [id number]  Chief Complaint  Patient presents with  . Follow-up    Right shoulder pain    HPI  [name] is a 50 y.o. female who is seen in follow-up of right shoulder pain. She is going to OT and doing very well.  She has less pain and more motion. She has no paresthesias.  She has no new trauma.  She is pleased at her progress.  HPI  Body mass index is 25.98 kg/(m^2).  ROS  Review of Systems  HENT: Negative for congestion.   Respiratory: Negative for cough and shortness of breath.   Cardiovascular: Negative for chest pain and leg swelling.       History of hypertension.  Endocrine: Positive for cold intolerance.  Musculoskeletal: Positive for arthralgias.  Allergic/Immunologic: Positive for environmental allergies.  All other systems reviewed and are negative.   Past Medical History  Diagnosis Date  . Hypertension   . Vaginal Pap smear, abnormal     Past Surgical History  Procedure Laterality Date  . Eye surgery    . Tonsillectomy      Family History  Problem Relation Age of Onset  . Hypertension Mother     Social History Social History  Substance Use Topics  . Smoking status: Current Every Day Smoker -- 0.50 packs/day for 30 years    Types: Cigarettes  . Smokeless tobacco: Never Used  . Alcohol Use: Yes     Comment: occassional    No Known Allergies  Current Outpatient Prescriptions  Medication Sig Dispense Refill  . amLODipine (NORVASC) 5 MG tablet Take 5 mg by mouth daily.    . Cholecalciferol (VITAMIN D PO) Take by mouth.    Marland Kitchen HYDROcodone-acetaminophen (NORCO) 7.5-325 MG tablet Take 1 tablet by mouth every 4 (four) hours as needed for moderate pain. 120 tablet 0  . Multiple Vitamin (MULTIVITAMIN WITH MINERALS) TABS tablet Take 1 tablet by mouth daily.    . naproxen (NAPROSYN) 500 MG tablet Take 1 tablet (500 mg total) by mouth 2 (two) times daily with a meal. 60 tablet 5    No current facility-administered medications for this visit.     Physical Exam  Blood pressure 127/88, pulse 92, resp. rate 16, height 5' 3.5" (1.613 m), weight 149 lb (67.586 kg), last menstrual period 12/23/2015.  Constitutional: overall normal hygiene, normal nutrition, well developed, normal grooming, normal body habitus. Assistive device:none  Musculoskeletal: gait and station Limp none, muscle tone and strength are normal, no tremors or atrophy is present.  .  Neurological: coordination overall normal.  Deep tendon reflex/nerve stretch intact.  Sensation normal.  Cranial nerves II-XII intact.   Skin:   normal overall no scars, lesions, ulcers or rashes. No psoriasis.  Psychiatric: Alert and oriented x 3.  Recent memory intact, remote memory unclear.  Normal mood and affect. Well groomed.  Good eye contact.  Cardiovascular: overall no swelling, no varicosities, no edema bilaterally, normal temperatures of the legs and arms, no clubbing, cyanosis and good capillary refill.  Lymphatic: palpation is normal.  She has full motion of both shoulders with some slight tenderness of the right shoulder.  NV intact.  Grips normal.  The patient has been educated about the nature of the problem(s) and counseled on treatment options.  The patient appeared to understand what I have discussed and is in agreement with it.  Encounter Diagnoses  Name Primary?  . Right shoulder pain Yes  . Ganglion cyst  of foot     PLAN Call if any problems.  Precautions discussed.  Continue current medications.   Return to clinic 3 weeks   Stay out of work.  Continue OT.  Electronically Signed [name], MD 6/6/20172:09 PM

## 2016-02-04 ENCOUNTER — Telehealth: Payer: Self-pay | Admitting: Orthopedic Surgery

## 2016-02-04 NOTE — Telephone Encounter (Signed)
Patient called to inquire as to whether our office has received short disability forms from [employer], and/or forms from her employer, [employer].  Patient had come by office earlier this week and I had relayed the process of patient completing [employer] forms along with fee as discussed.  To date, no forms have been received, nor has patient filled out [employer] forms; states she will come in Monday to take of her portion so that it may be then sent through our [facility] provider.

## 2016-02-08 DIAGNOSIS — M6281 Muscle weakness (generalized): Secondary | ICD-10-CM | POA: Diagnosis not present

## 2016-02-08 DIAGNOSIS — R293 Abnormal posture: Secondary | ICD-10-CM | POA: Diagnosis not present

## 2016-02-08 DIAGNOSIS — M25611 Stiffness of right shoulder, not elsewhere classified: Secondary | ICD-10-CM | POA: Diagnosis not present

## 2016-02-08 DIAGNOSIS — M25511 Pain in right shoulder: Secondary | ICD-10-CM | POA: Diagnosis not present

## 2016-02-10 DIAGNOSIS — R293 Abnormal posture: Secondary | ICD-10-CM | POA: Diagnosis not present

## 2016-02-10 DIAGNOSIS — M6281 Muscle weakness (generalized): Secondary | ICD-10-CM | POA: Diagnosis not present

## 2016-02-10 DIAGNOSIS — M25611 Stiffness of right shoulder, not elsewhere classified: Secondary | ICD-10-CM | POA: Diagnosis not present

## 2016-02-10 DIAGNOSIS — M25511 Pain in right shoulder: Secondary | ICD-10-CM | POA: Diagnosis not present

## 2016-02-15 DIAGNOSIS — M25611 Stiffness of right shoulder, not elsewhere classified: Secondary | ICD-10-CM | POA: Diagnosis not present

## 2016-02-15 DIAGNOSIS — M6281 Muscle weakness (generalized): Secondary | ICD-10-CM | POA: Diagnosis not present

## 2016-02-15 DIAGNOSIS — R293 Abnormal posture: Secondary | ICD-10-CM | POA: Diagnosis not present

## 2016-02-15 DIAGNOSIS — M25511 Pain in right shoulder: Secondary | ICD-10-CM | POA: Diagnosis not present

## 2016-02-17 DIAGNOSIS — R293 Abnormal posture: Secondary | ICD-10-CM | POA: Diagnosis not present

## 2016-02-17 DIAGNOSIS — M25611 Stiffness of right shoulder, not elsewhere classified: Secondary | ICD-10-CM | POA: Diagnosis not present

## 2016-02-17 DIAGNOSIS — M25511 Pain in right shoulder: Secondary | ICD-10-CM | POA: Diagnosis not present

## 2016-02-17 DIAGNOSIS — M6281 Muscle weakness (generalized): Secondary | ICD-10-CM | POA: Diagnosis not present

## 2016-02-22 ENCOUNTER — Ambulatory Visit (INDEPENDENT_AMBULATORY_CARE_PROVIDER_SITE_OTHER): Payer: BLUE CROSS/BLUE SHIELD | Admitting: Orthopaedic Surgery

## 2016-02-22 ENCOUNTER — Encounter: Payer: Self-pay | Admitting: Orthopaedic Surgery

## 2016-02-22 VITALS — BP 131/93 | HR 74 | Temp 97.3°F | Ht 63.5 in | Wt 149.0 lb

## 2016-02-22 DIAGNOSIS — M25511 Pain in right shoulder: Secondary | ICD-10-CM

## 2016-02-22 DIAGNOSIS — M25611 Stiffness of right shoulder, not elsewhere classified: Secondary | ICD-10-CM | POA: Diagnosis not present

## 2016-02-22 DIAGNOSIS — R293 Abnormal posture: Secondary | ICD-10-CM | POA: Diagnosis not present

## 2016-02-22 DIAGNOSIS — M6281 Muscle weakness (generalized): Secondary | ICD-10-CM | POA: Diagnosis not present

## 2016-02-22 NOTE — Progress Notes (Signed)
Patient [id number] [name], female DOB:August 07, 1966, 50 y.o. [id number]  Chief Complaint  Patient presents with  . Follow-up    right shoulder pain     HPI  [name] is a 50 y.o. female who has had shoulder pain on the right.  She has been to OT and is doing her exercises at home.  She is significantly improved.  She has no pain , no problem now.  HPI  Body mass index is 25.98 kg/(m^2).  ROS  Review of Systems  HENT: Negative for congestion.   Respiratory: Negative for cough and shortness of breath.   Cardiovascular: Negative for chest pain and leg swelling.       History of hypertension.  Endocrine: Positive for cold intolerance.  Musculoskeletal: Positive for arthralgias.  Allergic/Immunologic: Positive for environmental allergies.  All other systems reviewed and are negative.   Past Medical History  Diagnosis Date  . Hypertension   . Vaginal Pap smear, abnormal     Past Surgical History  Procedure Laterality Date  . Eye surgery    . Tonsillectomy      Family History  Problem Relation Age of Onset  . Hypertension Mother     Social History Social History  Substance Use Topics  . Smoking status: Current Every Day Smoker -- 0.50 packs/day for 30 years    Types: Cigarettes  . Smokeless tobacco: Never Used  . Alcohol Use: Yes     Comment: occassional    No Known Allergies  Current Outpatient Prescriptions  Medication Sig Dispense Refill  . amLODipine (NORVASC) 5 MG tablet Take 5 mg by mouth daily.    . Cholecalciferol (VITAMIN D PO) Take by mouth.    Marland Kitchen HYDROcodone-acetaminophen (NORCO) 7.5-325 MG tablet Take 1 tablet by mouth every 4 (four) hours as needed for moderate pain. 120 tablet 0  . Multiple Vitamin (MULTIVITAMIN WITH MINERALS) TABS tablet Take 1 tablet by mouth daily.    . naproxen (NAPROSYN) 500 MG tablet Take 1 tablet (500 mg total) by mouth 2 (two) times daily with a meal. 60 tablet 5   No current facility-administered  medications for this visit.     Physical Exam  Blood pressure 131/93, pulse 74, temperature 97.3 F (36.3 C), height 5' 3.5" (1.613 m), weight 149 lb (67.586 kg), last menstrual period 12/23/2015.  Constitutional: overall normal hygiene, normal nutrition, well developed, normal grooming, normal body habitus. Assistive device:none  Musculoskeletal: gait and station Limp none, muscle tone and strength are normal, no tremors or atrophy is present.  .  Neurological: coordination overall normal.  Deep tendon reflex/nerve stretch intact.  Sensation normal.  Cranial nerves II-XII intact.   Skin:   normal overall no scars, lesions, ulcers or rashes. No psoriasis.  Psychiatric: Alert and oriented x 3.  Recent memory intact, remote memory unclear.  Normal mood and affect. Well groomed.  Good eye contact.  Cardiovascular: overall no swelling, no varicosities, no edema bilaterally, normal temperatures of the legs and arms, no clubbing, cyanosis and good capillary refill.  Lymphatic: palpation is normal.  Examination of right Upper Extremity is done.  Inspection:   Overall:  Elbow non-tender without crepitus or defects, forearm non-tender without crepitus or defects, wrist non-tender without crepitus or defects, hand non-tender.    Shoulder: without glenohumeral joint tenderness, without effusion.   Upper arm: without swelling and tenderness   Range of motion:   Overall:  Full range of motion of the elbow, full range of motion of wrist and full  range of motion in fingers.   Shoulder:  right  full degrees forward flexion; full degrees abduction; full degrees internal rotation, full degrees external rotation, full degrees extension, full degrees adduction.   Stability:   Overall:  Shoulder, elbow and wrist stable   Strength and Tone:   Overall full shoulder muscles strength, full upper arm strength and normal upper arm bulk and tone.  The patient has been educated about the nature of the  problem(s) and counseled on treatment options.  The patient appeared to understand what I have discussed and is in agreement with it.  Encounter Diagnosis  Name Primary?  . Right shoulder pain Yes    PLAN Call if any problems.  Precautions discussed.  Continue current medications.   Return to clinic PRN  Electronically [place], MD 6/27/20172:15 PM

## 2016-09-04 DIAGNOSIS — J329 Chronic sinusitis, unspecified: Secondary | ICD-10-CM | POA: Diagnosis not present

## 2016-09-04 DIAGNOSIS — Z79899 Other long term (current) drug therapy: Secondary | ICD-10-CM | POA: Diagnosis not present

## 2016-09-04 DIAGNOSIS — F17200 Nicotine dependence, unspecified, uncomplicated: Secondary | ICD-10-CM | POA: Diagnosis not present

## 2016-09-05 DIAGNOSIS — Z1389 Encounter for screening for other disorder: Secondary | ICD-10-CM | POA: Diagnosis not present

## 2016-09-05 DIAGNOSIS — Z6826 Body mass index (BMI) 26.0-26.9, adult: Secondary | ICD-10-CM | POA: Diagnosis not present

## 2016-09-05 DIAGNOSIS — J019 Acute sinusitis, unspecified: Secondary | ICD-10-CM | POA: Diagnosis not present

## 2016-12-02 ENCOUNTER — Emergency Department (HOSPITAL_COMMUNITY)
Admission: EM | Admit: 2016-12-02 | Discharge: 2016-12-02 | Disposition: A | Discharge status: Home / Self Care | Payer: BLUE CROSS/BLUE SHIELD | Attending: Emergency Medicine | Admitting: Emergency Medicine

## 2016-12-02 ENCOUNTER — Emergency Department (HOSPITAL_COMMUNITY): Payer: BLUE CROSS/BLUE SHIELD

## 2016-12-02 ENCOUNTER — Encounter (HOSPITAL_COMMUNITY): Payer: Self-pay | Admitting: Emergency Medicine

## 2016-12-02 DIAGNOSIS — M542 Cervicalgia: Secondary | ICD-10-CM | POA: Diagnosis not present

## 2016-12-02 DIAGNOSIS — F17210 Nicotine dependence, cigarettes, uncomplicated: Secondary | ICD-10-CM | POA: Diagnosis not present

## 2016-12-02 DIAGNOSIS — R079 Chest pain, unspecified: Secondary | ICD-10-CM | POA: Diagnosis not present

## 2016-12-02 DIAGNOSIS — Z79899 Other long term (current) drug therapy: Secondary | ICD-10-CM | POA: Insufficient documentation

## 2016-12-02 DIAGNOSIS — R0789 Other chest pain: Secondary | ICD-10-CM

## 2016-12-02 LAB — TROPONIN I

## 2016-12-02 LAB — BASIC METABOLIC PANEL
Anion gap: 7 (ref 5–15)
BUN: 14 mg/dL (ref 6–20)
CALCIUM: 9.9 mg/dL (ref 8.9–10.3)
CO2: 28 mmol/L (ref 22–32)
CREATININE: 0.8 mg/dL (ref 0.44–1.00)
Chloride: 101 mmol/L (ref 101–111)
Glucose, Bld: 83 mg/dL (ref 65–99)
Potassium: 3.9 mmol/L (ref 3.5–5.1)
SODIUM: 136 mmol/L (ref 135–145)

## 2016-12-02 LAB — CBC
HCT: 38.6 % (ref 36.0–46.0)
HEMOGLOBIN: 12.9 g/dL (ref 12.0–15.0)
MCH: 26.2 pg (ref 26.0–34.0)
MCHC: 33.4 g/dL (ref 30.0–36.0)
MCV: 78.5 fL (ref 78.0–100.0)
Platelets: 255 10*3/uL (ref 150–400)
RBC: 4.92 MIL/uL (ref 3.87–5.11)
RDW: 14 % (ref 11.5–15.5)
WBC: 5.4 10*3/uL (ref 4.0–10.5)

## 2016-12-02 LAB — D-DIMER, QUANTITATIVE (NOT AT ARMC)

## 2016-12-02 MED ORDER — METHOCARBAMOL 500 MG PO TABS
500.0000 mg | ORAL_TABLET | Freq: Once | ORAL | Status: AC
  Administered 2016-12-02: 500 mg via ORAL
  Filled 2016-12-02: qty 1

## 2016-12-02 MED ORDER — NAPROXEN 250 MG PO TABS
500.0000 mg | ORAL_TABLET | Freq: Once | ORAL | Status: AC
  Administered 2016-12-02: 500 mg via ORAL
  Filled 2016-12-02: qty 2

## 2016-12-02 MED ORDER — METHOCARBAMOL 500 MG PO TABS: ORAL_TABLET | ORAL | 0 refills | Status: DC

## 2016-12-02 MED ORDER — NAPROXEN 500 MG PO TABS: 500.0000 mg | ORAL_TABLET | Freq: Two times a day (BID) | ORAL | 0 refills | Status: DC

## 2016-12-02 NOTE — Discharge Instructions (Signed)
Use ice and heat on your painful areas. Take the naproxen and robaxin for your pain. . Follow up with your doctor if not improving over the next week.

## 2016-12-02 NOTE — ED Triage Notes (Signed)
Pt states she was at work and started having left chest pain that is constant with pain in her neck and left arm that comes and goes

## 2016-12-02 NOTE — ED Provider Notes (Signed)
[facility] DEPT Provider Note   CSN: [id number] Arrival date & time: 12/02/16  0116  Time seen 02:40 AM   History   Chief Complaint Chief Complaint  Patient presents with  . Chest Pain    HPI [name] is a 51 y.o. female.  HPI  patient states about 8 PM while she was at work she started getting a pressure sensation in her left upper chest. She states it seems to radiate up into her left neck and then down into her left arm to about the level of the elbow. She states her left arm feels numb and that sensation comes and goes. She states when she turns her head to the left it makes her neck hurt more. She states the chest pain has been constant and bending over, taking a deep breath, or yawning makes it worse. Nothing makes it feel better. She has taken no medications. She has mild shortness of breath and states she was a little bit sweaty. She denies nausea, vomiting, pain or swelling of her extremities. She has not been traveling recently. She states she's never had this pain before. She does not take hormone replacement. Family history is negative for coronary artery disease but is significant for hypertension.  PCP Jana Half   Past Medical History:  Diagnosis Date  . Hypertension   . Vaginal Pap smear, abnormal     Patient Active Problem List   Diagnosis Date Noted  . Pain in joint, shoulder region 01/18/2016  . CIN I (cervical intraepithelial neoplasia I) 01/13/2016  . Right shoulder pain 10/05/2015  . Primary osteoarthritis of right foot 05/05/2014  . Ganglion cyst of foot 05/05/2014    Past Surgical History:  Procedure Laterality Date  . EYE SURGERY    . TONSILLECTOMY      OB History    Gravida Para Term Preterm AB Living   1 1       1    SAB TAB Ectopic Multiple Live Births                   Home Medications    Prior to Admission medications   Medication Sig Start Date End Date Taking? Authorizing Provider  amLODipine (NORVASC) 5  MG tablet Take 5 mg by mouth daily.   Yes Historical Provider, MD  Cholecalciferol (VITAMIN D PO) Take by mouth.   Yes Historical Provider, MD  Multiple Vitamin (MULTIVITAMIN WITH MINERALS) TABS tablet Take 1 tablet by mouth daily.   Yes Historical Provider, MD  HYDROcodone-acetaminophen (NORCO) 7.5-325 MG tablet Take 1 tablet by mouth every 4 (four) hours as needed for moderate pain. 01/04/16   [name], MD  methocarbamol (ROBAXIN) 500 MG tablet Take 1 or 2 po Q 6hrs for pain in your neck 12/02/16   Rolland Porter, MD  naproxen (NAPROSYN) 500 MG tablet Take 1 tablet (500 mg total) by mouth 2 (two) times daily with a meal. 12/02/16   Rolland Porter, MD    Family History Family History  Problem Relation Age of Onset  . Hypertension Mother     Social History Social History  Substance Use Topics  . Smoking status: Current Every Day Smoker    Packs/day: 0.50    Years: 30.00    Types: Cigarettes  . Smokeless tobacco: Never Used  . Alcohol use Yes     Comment: occassional, on the weekend  employed Smokes 1/3 ppd   Allergies   Patient has no known allergies.   Review of  Systems Review of Systems  All other systems reviewed and are negative.    Physical Exam Updated Vital Signs BP (!) 129/96   Pulse 73   Temp 97.6 F (36.4 C) (Oral)   Resp 13   Ht 5\' 3"  (1.6 m)   Wt 144 lb (65.3 kg)   LMP  (LMP Unknown) Comment: about 2.5 months  SpO2 97%   BMI 25.51 kg/m   Vital signs normal    Physical Exam  Constitutional: She is oriented to person, place, and time. She appears well-developed and well-nourished.  Non-toxic appearance. She does not appear ill. No distress.  HENT:  Head: Normocephalic and atraumatic.  Right Ear: External ear normal.  Left Ear: External ear normal.  Nose: Nose normal. No mucosal edema or rhinorrhea.  Mouth/Throat: Oropharynx is clear and moist and mucous membranes are normal. No dental abscesses or uvula swelling.  Eyes: Conjunctivae and EOM are normal.  Pupils are equal, round, and reactive to light.  Neck: Normal range of motion and full passive range of motion without pain. Neck supple.    Cardiovascular: Normal rate, regular rhythm and normal heart sounds.  Exam reveals no gallop and no friction rub.   No murmur heard. Pulmonary/Chest: Effort normal and breath sounds normal. No respiratory distress. She has no wheezes. She has no rhonchi. She has no rales. She exhibits no tenderness and no crepitus.    Area of pain noted  Abdominal: Soft. Normal appearance and bowel sounds are normal. She exhibits no distension. There is no tenderness. There is no rebound and no guarding.  Musculoskeletal: Normal range of motion. She exhibits no edema or tenderness.  Moves all extremities well.   Neurological: She is alert and oriented to person, place, and time. She has normal strength. No cranial nerve deficit.  Skin: Skin is warm, dry and intact. No rash noted. No erythema. No pallor.  Psychiatric: She has a normal mood and affect. Her speech is normal and behavior is normal. Her mood appears not anxious.  Nursing note and vitals reviewed.    ED Treatments / Results  Labs (all labs ordered are listed, but only abnormal results are displayed) Results for orders placed or performed during the hospital encounter of 19/14/78  Basic metabolic panel  Result Value Ref Range   Sodium 136 135 - 145 mmol/L   Potassium 3.9 3.5 - 5.1 mmol/L   Chloride 101 101 - 111 mmol/L   CO2 28 22 - 32 mmol/L   Glucose, Bld 83 65 - 99 mg/dL   BUN 14 6 - 20 mg/dL   Creatinine, Ser 0.80 0.44 - 1.00 mg/dL   Calcium 9.9 8.9 - 10.3 mg/dL   GFR calc non Af Amer >60 >60 mL/min   GFR calc Af Amer >60 >60 mL/min   Anion gap 7 5 - 15  CBC  Result Value Ref Range   WBC 5.4 4.0 - 10.5 K/uL   RBC 4.92 3.87 - 5.11 MIL/uL   Hemoglobin 12.9 12.0 - 15.0 g/dL   HCT 38.6 36.0 - 46.0 %   MCV 78.5 78.0 - 100.0 fL   MCH 26.2 26.0 - 34.0 pg   MCHC 33.4 30.0 - 36.0 g/dL   RDW  14.0 11.5 - 15.5 %   Platelets 255 150 - 400 K/uL  Troponin I  Result Value Ref Range   Troponin I <0.03 <0.03 ng/mL  D-dimer, quantitative  Result Value Ref Range   D-Dimer, Quant <0.27 0.00 - 0.50 ug/mL-FEU  Laboratory interpretation all normal     EKG  EKG Interpretation  Date/Time:  Saturday December 02 2016 01:31:38 EDT Ventricular Rate:  81 PR Interval:    QRS Duration: 82 QT Interval:  355 QTC Calculation: 412 R Axis:   27 Text Interpretation:  Sinus rhythm Consider left atrial enlargement Electrode noise Otherwise within normal limits No old tracing to compare Confirmed by [place] Stanger  MD-I, Nichele Slawson ([id number]) on 12/02/2016 1:41:50 AM       Radiology Dg Chest 2 View  Result Date: 12/02/2016 CLINICAL DATA:  51 y/o  F; chest pain. EXAM: CHEST  2 VIEW COMPARISON:  09/27/2015 chest radiograph FINDINGS: Stable heart size and mediastinal contours are within normal limits. Both lungs are clear. The visualized skeletal structures are unremarkable. IMPRESSION: No active cardiopulmonary disease. Electronically Signed   By: Kristine Garbe M.D.   On: 12/02/2016 02:46   Dg Cervical Spine Complete  Result Date: 12/02/2016 CLINICAL DATA:  51 y/o F; left-sided neck pain with numbness to the left elbow. EXAM: CERVICAL SPINE - COMPLETE 4+ VIEW COMPARISON:  07/16/2016 CT of the cervical spine. FINDINGS: Straightening of cervical lordosis with mild reversal apex at the C4-5 level is stable. Stable mild degenerative loss of height of the C4 and C5 vertebral bodies. Stable cervical spondylosis with discogenic disease greatest from C4 through C6 and minimal facet arthrosis. No acute fracture or dislocation. No prevertebral soft tissue swelling. IMPRESSION: 1. No acute fracture or dislocation of the cervical spine. 2. Stable moderate cervical spondylosis greatest at the C4 through C6 levels. Electronically Signed   By: Kristine Garbe M.D.   On: 12/02/2016 04:10    Procedures Procedures  (including critical care time)  Medications Ordered in ED Medications  naproxen (NAPROSYN) tablet 500 mg (500 mg Oral Given 12/02/16 0331)  methocarbamol (ROBAXIN) tablet 500 mg (500 mg Oral Given 12/02/16 0331)     Initial Impression / Assessment and Plan / ED Course  I have reviewed the triage vital signs and the nursing notes.  Pertinent labs & imaging results that were available during my care of the patient were reviewed by me and considered in my medical decision making (see chart for details).  Pt is asking about getting something to eat. She refuses to take any oral meds without eating first.   Pt was allowed to eat after her troponin came back negative.   Recheck at 4:30 AM although patient has eaten she hasn't taken her pills yet. We discussed her test results including her negative d-dimer. She was discharged on naproxen and looking at her chart she has 5 refills when it was prescribed in February. She also was prescribed Robaxin. She will follow-up with her family doctor if not improving in the next week.  Final Clinical Impressions(s) / ED Diagnoses   Final diagnoses:  Chest wall pain  Neck pain on left side    New Prescriptions New Prescriptions   METHOCARBAMOL (ROBAXIN) 500 MG TABLET    Take 1 or 2 po Q 6hrs for pain in your neck   Modified Medications   Modified Medication Previous Medication   NAPROXEN (NAPROSYN) 500 MG TABLET naproxen (NAPROSYN) 500 MG tablet      Take 1 tablet (500 mg total) by mouth 2 (two) times daily with a meal.    Take 1 tablet (500 mg total) by mouth 2 (two) times daily with a meal.    Plan discharge  Rolland Porter, MD, Barbette Or, MD 12/02/16 [phone]

## 2017-02-07 ENCOUNTER — Encounter: Payer: Self-pay | Admitting: Orthopaedic Surgery

## 2017-02-07 ENCOUNTER — Ambulatory Visit (INDEPENDENT_AMBULATORY_CARE_PROVIDER_SITE_OTHER): Payer: BLUE CROSS/BLUE SHIELD | Admitting: Orthopaedic Surgery

## 2017-02-07 VITALS — BP 133/97 | HR 92 | Temp 97.7°F | Ht 64.25 in | Wt 140.0 lb

## 2017-02-07 DIAGNOSIS — F17210 Nicotine dependence, cigarettes, uncomplicated: Secondary | ICD-10-CM

## 2017-02-07 DIAGNOSIS — G8929 Other chronic pain: Secondary | ICD-10-CM

## 2017-02-07 DIAGNOSIS — M25511 Pain in right shoulder: Secondary | ICD-10-CM | POA: Diagnosis not present

## 2017-02-07 MED ORDER — HYDROCODONE-ACETAMINOPHEN 5-325 MG PO TABS: ORAL_TABLET | ORAL | 0 refills | Status: DC

## 2017-02-07 NOTE — Patient Instructions (Signed)
Steps to Quit Smoking Smoking tobacco can be bad for your health. It can also affect almost every organ in your body. Smoking puts you and people around you at risk for many serious long-lasting (chronic) diseases. Quitting smoking is hard, but it is one of the best things that you can do for your health. It is never too late to quit. What are the benefits of quitting smoking? When you quit smoking, you lower your risk for getting serious diseases and conditions. They can include:  Lung cancer or lung disease.  Heart disease.  Stroke.  Heart attack.  Not being able to have children (infertility).  Weak bones (osteoporosis) and broken bones (fractures).  If you have coughing, wheezing, and shortness of breath, those symptoms may get better when you quit. You may also get sick less often. If you are pregnant, quitting smoking can help to lower your chances of having a baby of low birth weight. What can I do to help me quit smoking? Talk with your doctor about what can help you quit smoking. Some things you can do (strategies) include:  Quitting smoking totally, instead of slowly cutting back how much you smoke over a period of time.  Going to in-person counseling. You are more likely to quit if you go to many counseling sessions.  Using resources and support systems, such as: ? Online chats with a counselor. ? Phone quitlines. ? Printed self-help materials. ? Support groups or group counseling. ? Text messaging programs. ? Mobile phone apps or applications.  Taking medicines. Some of these medicines may have nicotine in them. If you are pregnant or breastfeeding, do not take any medicines to quit smoking unless your doctor says it is okay. Talk with your doctor about counseling or other things that can help you.  Talk with your doctor about using more than one strategy at the same time, such as taking medicines while you are also going to in-person counseling. This can help make  quitting easier. What things can I do to make it easier to quit? Quitting smoking might feel very hard at first, but there is a lot that you can do to make it easier. Take these steps:  Talk to your family and friends. Ask them to support and encourage you.  Call phone quitlines, reach out to support groups, or work with a counselor.  Ask people who smoke to not smoke around you.  Avoid places that make you want (trigger) to smoke, such as: ? Bars. ? Parties. ? Smoke-break areas at work.  Spend time with people who do not smoke.  Lower the stress in your life. Stress can make you want to smoke. Try these things to help your stress: ? Getting regular exercise. ? Deep-breathing exercises. ? Yoga. ? Meditating. ? Doing a body scan. To do this, close your eyes, focus on one area of your body at a time from head to toe, and notice which parts of your body are tense. Try to relax the muscles in those areas.  Download or buy apps on your mobile phone or tablet that can help you stick to your quit plan. There are many free apps, such as QuitGuide from the CDC (Centers for Disease Control and Prevention). You can find more support from smokefree.gov and other websites.  This information is not intended to replace advice given to you by your health care provider. Make sure you discuss any questions you have with your health care provider. Document Released: 06/10/2009 Document   Revised: 04/11/2016 Document Reviewed: 12/29/2014 Elsevier Interactive Patient Education  2018 Elsevier Inc.  

## 2017-02-07 NOTE — Progress Notes (Signed)
Patient [name], female DOB:1966/06/08, 51 y.o. JIR:[id number]  Chief Complaint  Patient presents with  . Shoulder Pain    right    HPI  [name] is a 51 y.o. female who has recurring pain in the right shoulder. She was seen here last June with the same problem.  She has pain raising her hand over her head and rolling on the shoulder at night.  She has no numbness, no trauma, no redness.  She has stopped taking her Naprosyn. I told her to resume it. HPI  Body mass index is 23.84 kg/m.  ROS  Review of Systems  HENT: Negative for congestion.   Respiratory: Negative for cough and shortness of breath.   Cardiovascular: Negative for chest pain and leg swelling.       History of hypertension.  Endocrine: Positive for cold intolerance.  Musculoskeletal: Positive for arthralgias.  Allergic/Immunologic: Positive for environmental allergies.  All other systems reviewed and are negative.   Past Medical History:  Diagnosis Date  . Hypertension   . Vaginal Pap smear, abnormal     Past Surgical History:  Procedure Laterality Date  . EYE SURGERY    . TONSILLECTOMY      Family History  Problem Relation Age of Onset  . Hypertension Mother     Social History Social History  Substance Use Topics  . Smoking status: Current Every Day Smoker    Packs/day: 0.50    Years: 30.00    Types: Cigarettes  . Smokeless tobacco: Never Used  . Alcohol use Yes     Comment: occassional, on the weekend    No Known Allergies  Current Outpatient Prescriptions  Medication Sig Dispense Refill  . amLODipine (NORVASC) 5 MG tablet Take 5 mg by mouth daily.    . Cholecalciferol (VITAMIN D PO) Take by mouth.    Marland Kitchen HYDROcodone-acetaminophen (NORCO) 7.5-325 MG tablet Take 1 tablet by mouth every 4 (four) hours as needed for moderate pain. (Patient not taking: Reported on 02/07/2017) 120 tablet 0  . HYDROcodone-acetaminophen (NORCO/VICODIN) 5-325 MG tablet One tablet every four  hours as needed for acute pain.  Limit of five days per [place] statue. 30 tablet 0  . methocarbamol (ROBAXIN) 500 MG tablet Take 1 or 2 po Q 6hrs for pain in your neck (Patient not taking: Reported on 02/07/2017) 60 tablet 0  . Multiple Vitamin (MULTIVITAMIN WITH MINERALS) TABS tablet Take 1 tablet by mouth daily.    . naproxen (NAPROSYN) 500 MG tablet Take 1 tablet (500 mg total) by mouth 2 (two) times daily with a meal. (Patient not taking: Reported on 02/07/2017) 30 tablet 0   No current facility-administered medications for this visit.      Physical Exam  Blood pressure (!) 133/97, pulse 92, temperature 97.7 F (36.5 C), height 5' 4.25" (1.632 m), weight 140 lb (63.5 kg).  Constitutional: overall normal hygiene, normal nutrition, well developed, normal grooming, normal body habitus. Assistive device:none  Musculoskeletal: gait and station Limp none, muscle tone and strength are normal, no tremors or atrophy is present.  .  Neurological: coordination overall normal.  Deep tendon reflex/nerve stretch intact.  Sensation normal.  Cranial nerves II-XII intact.   Skin:   Normal overall no scars, lesions, ulcers or rashes. No psoriasis.  Psychiatric: Alert and oriented x 3.  Recent memory intact, remote memory unclear.  Normal mood and affect. Well groomed.  Good eye contact.  Cardiovascular: overall no swelling, no varicosities, no edema bilaterally, normal temperatures  of the legs and arms, no clubbing, cyanosis and good capillary refill.  Lymphatic: palpation is normal.  Examination of right Upper Extremity is done.  Inspection:   Overall:  Elbow non-tender without crepitus or defects, forearm non-tender without crepitus or defects, wrist non-tender without crepitus or defects, hand non-tender.    Shoulder: with glenohumeral joint tenderness, without effusion.   Upper arm: without swelling and tenderness   Range of motion:   Overall:  Full range of motion of the elbow, full range  of motion of wrist and full range of motion in fingers.   Shoulder:  right  165 degrees forward flexion; 150 degrees abduction; 30 degrees internal rotation, 30 degrees external rotation, 15 degrees extension, 40 degrees adduction.   Stability:   Overall:  Shoulder, elbow and wrist stable   Strength and Tone:   Overall full shoulder muscles strength, full upper arm strength and normal upper arm bulk and tone.   The patient has been educated about the nature of the problem(s) and counseled on treatment options.  The patient appeared to understand what I have discussed and is in agreement with it.  Encounter Diagnoses  Name Primary?  . Chronic right shoulder pain Yes  . Cigarette nicotine dependence without complication    PROCEDURE NOTE:  The patient request injection, verbal consent was obtained.  The right shoulder was prepped appropriately after time out was performed.   Sterile technique was observed and injection of 1 cc of Depo-Medrol 40 mg with several cc's of plain xylocaine. Anesthesia was provided by ethyl chloride and a 20-gauge needle was used to inject the shoulder area. A posterior approach was used.  The injection was tolerated well.  A band aid dressing was applied.  The patient was advised to apply ice later today and tomorrow to the injection sight as needed.   PLAN Call if any problems.  Precautions discussed.  Continue current medications.   Return to clinic 2 weeks  Out of work  I have reviewed the [place] web site prior to prescribing narcotic medicine for this patient.  Electronically Signed [name], MD 6/13/20183:33 PM

## 2017-02-21 ENCOUNTER — Ambulatory Visit: Payer: BLUE CROSS/BLUE SHIELD | Admitting: Orthopaedic Surgery

## 2017-02-22 ENCOUNTER — Ambulatory Visit (INDEPENDENT_AMBULATORY_CARE_PROVIDER_SITE_OTHER): Payer: BLUE CROSS/BLUE SHIELD | Admitting: Orthopaedic Surgery

## 2017-02-22 ENCOUNTER — Encounter: Payer: Self-pay | Admitting: Orthopaedic Surgery

## 2017-02-22 VITALS — BP 134/94 | HR 76 | Temp 97.4°F | Ht 64.25 in | Wt 141.0 lb

## 2017-02-22 DIAGNOSIS — F17210 Nicotine dependence, cigarettes, uncomplicated: Secondary | ICD-10-CM

## 2017-02-22 DIAGNOSIS — G8929 Other chronic pain: Secondary | ICD-10-CM

## 2017-02-22 DIAGNOSIS — M25511 Pain in right shoulder: Secondary | ICD-10-CM | POA: Diagnosis not present

## 2017-02-22 MED ORDER — HYDROCODONE-ACETAMINOPHEN 5-325 MG PO TABS: ORAL_TABLET | ORAL | 0 refills | Status: DC

## 2017-02-22 NOTE — Patient Instructions (Addendum)
Steps to Quit Smoking Smoking tobacco can be bad for your health. It can also affect almost every organ in your body. Smoking puts you and people around you at risk for many serious Phinneas Shakoor-lasting (chronic) diseases. Quitting smoking is hard, but it is one of the best things that you can do for your health. It is never too late to quit. What are the benefits of quitting smoking? When you quit smoking, you lower your risk for getting serious diseases and conditions. They can include:  Lung cancer or lung disease.  Heart disease.  Stroke.  Heart attack.  Not being able to have children (infertility).  Weak bones (osteoporosis) and broken bones (fractures).  If you have coughing, wheezing, and shortness of breath, those symptoms may get better when you quit. You may also get sick less often. If you are pregnant, quitting smoking can help to lower your chances of having a baby of low birth weight. What can I do to help me quit smoking? Talk with your doctor about what can help you quit smoking. Some things you can do (strategies) include:  Quitting smoking totally, instead of slowly cutting back how much you smoke over a period of time.  Going to in-person counseling. You are more likely to quit if you go to many counseling sessions.  Using resources and support systems, such as: ? Online chats with a counselor. ? Phone quitlines. ? Printed self-help materials. ? Support groups or group counseling. ? Text messaging programs. ? Mobile phone apps or applications.  Taking medicines. Some of these medicines may have nicotine in them. If you are pregnant or breastfeeding, do not take any medicines to quit smoking unless your doctor says it is okay. Talk with your doctor about counseling or other things that can help you.  Talk with your doctor about using more than one strategy at the same time, such as taking medicines while you are also going to in-person counseling. This can help make  quitting easier. What things can I do to make it easier to quit? Quitting smoking might feel very hard at first, but there is a lot that you can do to make it easier. Take these steps:  Talk to your family and friends. Ask them to support and encourage you.  Call phone quitlines, reach out to support groups, or work with a counselor.  Ask people who smoke to not smoke around you.  Avoid places that make you want (trigger) to smoke, such as: ? Bars. ? Parties. ? Smoke-break areas at work.  Spend time with people who do not smoke.  Lower the stress in your life. Stress can make you want to smoke. Try these things to help your stress: ? Getting regular exercise. ? Deep-breathing exercises. ? Yoga. ? Meditating. ? Doing a body scan. To do this, close your eyes, focus on one area of your body at a time from head to toe, and notice which parts of your body are tense. Try to relax the muscles in those areas.  Download or buy apps on your mobile phone or tablet that can help you stick to your quit plan. There are many free apps, such as QuitGuide from the CDC (Centers for Disease Control and Prevention). You can find more support from smokefree.gov and other websites.  This information is not intended to replace advice given to you by your health care provider. Make sure you discuss any questions you have with your health care provider. Document Released: 06/10/2009 Document   Revised: 04/11/2016 Document Reviewed: 12/29/2014 Elsevier Interactive Patient Education  2018 Elsevier Inc.  

## 2017-02-22 NOTE — Progress Notes (Signed)
Patient [name], female DOB:07-21-1966, 51 y.o. JOA:[id number]  Chief Complaint  Patient presents with  . Follow-up    chronic pain right shoulder    HPI  [name] is a 51 y.o. female who has right shoulder pain. She is significantly improved since the injection last time.She has some pain more at night.  She has been laid off work.  She is doing her exercises. HPI  Body mass index is 24.01 kg/m.  ROS  Review of Systems  HENT: Negative for congestion.   Respiratory: Negative for cough and shortness of breath.   Cardiovascular: Negative for chest pain and leg swelling.       History of hypertension.  Endocrine: Positive for cold intolerance.  Musculoskeletal: Positive for arthralgias.  Allergic/Immunologic: Positive for environmental allergies.  All other systems reviewed and are negative.   Past Medical History:  Diagnosis Date  . Hypertension   . Vaginal Pap smear, abnormal     Past Surgical History:  Procedure Laterality Date  . EYE SURGERY    . TONSILLECTOMY      Family History  Problem Relation Age of Onset  . Hypertension Mother     Social History Social History  Substance Use Topics  . Smoking status: Current Every Day Smoker    Packs/day: 0.50    Years: 30.00    Types: Cigarettes  . Smokeless tobacco: Never Used  . Alcohol use Yes     Comment: occassional, on the weekend    No Known Allergies  Current Outpatient Prescriptions  Medication Sig Dispense Refill  . amLODipine (NORVASC) 5 MG tablet Take 5 mg by mouth daily.    . Cholecalciferol (VITAMIN D PO) Take by mouth.    Marland Kitchen HYDROcodone-acetaminophen (NORCO) 7.5-325 MG tablet Take 1 tablet by mouth every 4 (four) hours as needed for moderate pain. (Patient not taking: Reported on 02/07/2017) 120 tablet 0  . HYDROcodone-acetaminophen (NORCO/VICODIN) 5-325 MG tablet One tablet every four hours as needed for acute pain.  Limit of five days per [place] statue. 30 tablet 0  .  methocarbamol (ROBAXIN) 500 MG tablet Take 1 or 2 po Q 6hrs for pain in your neck (Patient not taking: Reported on 02/07/2017) 60 tablet 0  . Multiple Vitamin (MULTIVITAMIN WITH MINERALS) TABS tablet Take 1 tablet by mouth daily.    . naproxen (NAPROSYN) 500 MG tablet Take 1 tablet (500 mg total) by mouth 2 (two) times daily with a meal. (Patient not taking: Reported on 02/07/2017) 30 tablet 0   No current facility-administered medications for this visit.      Physical Exam  Blood pressure (!) 134/94, pulse 76, temperature 97.4 F (36.3 C), height 5' 4.25" (1.632 m), weight 141 lb (64 kg).  Constitutional: overall normal hygiene, normal nutrition, well developed, normal grooming, normal body habitus. Assistive device:none  Musculoskeletal: gait and station Limp none, muscle tone and strength are normal, no tremors or atrophy is present.  .  Neurological: coordination overall normal.  Deep tendon reflex/nerve stretch intact.  Sensation normal.  Cranial nerves II-XII intact.   Skin:   Normal overall no scars, lesions, ulcers or rashes. No psoriasis.  Psychiatric: Alert and oriented x 3.  Recent memory intact, remote memory unclear.  Normal mood and affect. Well groomed.  Good eye contact.  Cardiovascular: overall no swelling, no varicosities, no edema bilaterally, normal temperatures of the legs and arms, no clubbing, cyanosis and good capillary refill.  Lymphatic: palpation is normal.  She has full ROM  of the right shoulder with some tenderness in the extremes.  NV intact.  Muscle tone and strength are normal.  The patient has been educated about the nature of the problem(s) and counseled on treatment options.  The patient appeared to understand what I have discussed and is in agreement with it.  Encounter Diagnoses  Name Primary?  . Chronic right shoulder pain Yes  . Cigarette nicotine dependence without complication     PLAN Call if any problems.  Precautions discussed.   Continue current medications.   Return to clinic prn   I have reviewed the [place] web site prior to prescribing narcotic medicine for this patient.  Electronically Signed [name], MD 6/28/20182:11 PM

## 2017-06-07 ENCOUNTER — Other Ambulatory Visit: Payer: BLUE CROSS/BLUE SHIELD | Admitting: Obstetrics and Gynecology

## 2017-06-28 ENCOUNTER — Encounter: Payer: Self-pay | Admitting: *Deleted

## 2017-06-28 ENCOUNTER — Other Ambulatory Visit: Payer: BLUE CROSS/BLUE SHIELD | Admitting: Obstetrics and Gynecology

## 2017-07-05 ENCOUNTER — Encounter (INDEPENDENT_AMBULATORY_CARE_PROVIDER_SITE_OTHER): Payer: Self-pay

## 2017-07-05 ENCOUNTER — Other Ambulatory Visit (HOSPITAL_COMMUNITY)
Admission: RE | Admit: 2017-07-05 | Discharge: 2017-07-05 | Disposition: A | Discharge status: Home / Self Care | Payer: BLUE CROSS/BLUE SHIELD | Source: Ambulatory Visit | Attending: Obstetrics and Gynecology | Admitting: Obstetrics and Gynecology

## 2017-07-05 ENCOUNTER — Ambulatory Visit (INDEPENDENT_AMBULATORY_CARE_PROVIDER_SITE_OTHER): Payer: BLUE CROSS/BLUE SHIELD | Admitting: Adult Health

## 2017-07-05 ENCOUNTER — Encounter: Payer: Self-pay | Admitting: Obstetrics & Gynecology

## 2017-07-05 VITALS — BP 128/82 | HR 80 | Ht 63.0 in | Wt 146.5 lb

## 2017-07-05 DIAGNOSIS — Z1212 Encounter for screening for malignant neoplasm of rectum: Secondary | ICD-10-CM | POA: Diagnosis not present

## 2017-07-05 DIAGNOSIS — Z01419 Encounter for gynecological examination (general) (routine) without abnormal findings: Secondary | ICD-10-CM | POA: Diagnosis not present

## 2017-07-05 DIAGNOSIS — Z87898 Personal history of other specified conditions: Secondary | ICD-10-CM | POA: Diagnosis not present

## 2017-07-05 DIAGNOSIS — Z1211 Encounter for screening for malignant neoplasm of colon: Secondary | ICD-10-CM

## 2017-07-05 DIAGNOSIS — Z8742 Personal history of other diseases of the female genital tract: Secondary | ICD-10-CM

## 2017-07-05 LAB — HEMOCCULT GUIAC POC 1CARD (OFFICE): FECAL OCCULT BLD: NEGATIVE

## 2017-07-05 NOTE — Progress Notes (Signed)
Patient ID: [name], female   DOB: 08/04/1966, 52 y.o.   MRN: [id number] History of Present Illness: [name] is a 51 year old black female,married, in for well woman gyn exam and pap.Her pap last year was LSIL. PCP is [place].    Current Medications, Allergies, Past Medical History, Past Surgical History, Family History and Social History were reviewed in [employer] record.     Review of Systems: Patient denies any headaches, hearing loss, fatigue, blurred vision, shortness of breath, chest pain, abdominal pain, problems with bowel movements, urination, or intercourse. No joint pain or mood swings.Periods not regular, will skip months then have.    Physical Exam:BP 128/82 (BP Location: Left Arm, Patient Position: Sitting, Cuff Size: Normal)   Pulse 80   Ht 5\' 3"  (1.6 m)   Wt 146 lb 8 oz (66.5 kg)   LMP 07/03/2017   BMI 25.95 kg/m  General:  Well developed, well nourished, no acute distress Skin:  Warm and dry Neck:  Midline trachea, normal thyroid, good ROM, no lymphadenopathy Lungs; Clear to auscultation bilaterally,left nipple inverted and has been for years Breast:  No dominant palpable mass, retraction, or nipple discharge Cardiovascular: Regular rate and rhythm Abdomen:  Soft, non tender, no hepatosplenomegaly Pelvic:  External genitalia is normal in appearance, no lesions.  The vagina is normal in appearance,+period blood. Urethra has no lesions or masses. The cervix is bulbous. Pap with HPV and GC/CHL performed. Uterus is felt to be normal size, shape, and contour.  No adnexal masses or tenderness noted.Bladder is non tender, no masses felt. Rectal: Good sphincter tone, no polyps, or hemorrhoids felt.  Hemoccult negative. Extremities/musculoskeletal:  No swelling or varicosities noted, no clubbing or cyanosis Psych:  No mood changes, alert and cooperative,seems happy PHQ 2 score 0  Impression:  1. Encounter for gynecological examination  with Papanicolaou smear of cervix   2. History of abnormal cervical Pap smear   3. Screening for colorectal cancer      Plan:  Physical in 1 year Pap in 3 if normal Mammogram yearly Colonoscopy at 4  Labs with PCP

## 2017-07-10 LAB — CYTOLOGY - PAP
Chlamydia: NEGATIVE
DIAGNOSIS: NEGATIVE
HPV: NOT DETECTED
Neisseria Gonorrhea: NEGATIVE

## 2017-08-16 ENCOUNTER — Encounter: Payer: Self-pay | Admitting: Orthopaedic Surgery

## 2017-08-16 ENCOUNTER — Ambulatory Visit (INDEPENDENT_AMBULATORY_CARE_PROVIDER_SITE_OTHER): Payer: BLUE CROSS/BLUE SHIELD | Admitting: Orthopaedic Surgery

## 2017-08-16 VITALS — Temp 97.8°F | Ht 64.0 in | Wt 146.0 lb

## 2017-08-16 DIAGNOSIS — M25511 Pain in right shoulder: Secondary | ICD-10-CM

## 2017-08-16 DIAGNOSIS — G8929 Other chronic pain: Secondary | ICD-10-CM

## 2017-08-16 DIAGNOSIS — F17210 Nicotine dependence, cigarettes, uncomplicated: Secondary | ICD-10-CM

## 2017-08-16 MED ORDER — HYDROCODONE-ACETAMINOPHEN 5-325 MG PO TABS: ORAL_TABLET | ORAL | 0 refills | Status: DC

## 2017-08-16 NOTE — Patient Instructions (Addendum)

## 2017-08-16 NOTE — Progress Notes (Signed)
PROCEDURE NOTE:  The patient request injection, verbal consent was obtained.  The right shoulder was prepped appropriately after time out was performed.   Sterile technique was observed and injection of 1 cc of Depo-Medrol 40 mg with several cc's of plain xylocaine. Anesthesia was provided by ethyl chloride and a 20-gauge needle was used to inject the shoulder area. A posterior approach was used.  The injection was tolerated well.  A band aid dressing was applied.  The patient was advised to apply ice later today and tomorrow to the injection sight as needed.  The patient was counseled about smoking and smoking cessation.  I spent about three to five minutes in doing this.  I, of course, determined the patient does smoke and frequency.  I have advised the patient of problems medically that can occur with continued smoking including poor wound and bone healing as well as the obvious respiratory problems.  I have assessed the patient's willingness to cut back and quit smoking.  The patient has stated she has cut back to 6 to 7 a day and is willing to try to stop.  I have told the patient to discuss with their family doctor also about smoking cessation.  I am willing to assist my patient in arranging this and to get additional help.I have suggested the patient have a set date to try to begin cutting back.  I have printed out a smoking cessation form about how to begin cutting back and suggestions.  I will discuss this further when I see the patient in the future.  I have stressed that although this will be very difficult to accomplish, the benefits are very substantial and will give long term health benefits from now on.  I have reviewed the [place] web site prior to prescribing narcotic medicine for this patient.  Call if any problem.  Precautions discussed. Return in two weeks.  Stay out of work.   Electronically Signed [name],  MD 12/20/201810:28 AM

## 2017-08-30 ENCOUNTER — Ambulatory Visit (INDEPENDENT_AMBULATORY_CARE_PROVIDER_SITE_OTHER): Payer: BLUE CROSS/BLUE SHIELD | Admitting: Orthopaedic Surgery

## 2017-08-30 ENCOUNTER — Encounter: Payer: Self-pay | Admitting: Orthopaedic Surgery

## 2017-08-30 VITALS — BP 115/78 | HR 89 | Temp 97.9°F | Ht 64.0 in | Wt 146.0 lb

## 2017-08-30 DIAGNOSIS — F17210 Nicotine dependence, cigarettes, uncomplicated: Secondary | ICD-10-CM | POA: Diagnosis not present

## 2017-08-30 DIAGNOSIS — M25511 Pain in right shoulder: Secondary | ICD-10-CM | POA: Diagnosis not present

## 2017-08-30 DIAGNOSIS — G8929 Other chronic pain: Secondary | ICD-10-CM | POA: Diagnosis not present

## 2017-08-30 NOTE — Patient Instructions (Addendum)

## 2017-08-30 NOTE — Progress Notes (Signed)
Patient [name], female DOB:11/22/1965, 52 y.o. QIO:[id number]  Chief Complaint  Patient presents with  . Shoulder Pain    right    HPI  [name] is a 52 y.o. female who has right shoulder pain.  The injection last time helped but she is still having pain.  She has some more motion.  I will begin PT for her. HPI  Body mass index is 25.06 kg/m.  ROS  Review of Systems  HENT: Negative for congestion.   Respiratory: Negative for cough and shortness of breath.   Cardiovascular: Negative for chest pain and leg swelling.       History of hypertension.  Endocrine: Positive for cold intolerance.  Musculoskeletal: Positive for arthralgias.  Allergic/Immunologic: Positive for environmental allergies.  All other systems reviewed and are negative.   Past Medical History:  Diagnosis Date  . Hypertension   . Vaginal Pap smear, abnormal     Past Surgical History:  Procedure Laterality Date  . EYE SURGERY    . TONSILLECTOMY      Family History  Problem Relation Age of Onset  . Hypertension Mother     Social History Social History   Tobacco Use  . Smoking status: Current Every Day Smoker    Packs/day: 0.50    Years: 30.00    Pack years: 15.00    Types: Cigarettes  . Smokeless tobacco: Never Used  Substance Use Topics  . Alcohol use: Yes    Comment: occassional, on the weekend  . Drug use: No    No Known Allergies  Current Outpatient Medications  Medication Sig Dispense Refill  . amLODipine (NORVASC) 5 MG tablet Take 5 mg by mouth daily.    Marland Kitchen HYDROcodone-acetaminophen (NORCO/VICODIN) 5-325 MG tablet One tablet every four hours as needed for acute pain.  Limit of five days per [place] statue. 30 tablet 0  . methocarbamol (ROBAXIN) 500 MG tablet Take 1 or 2 po Q 6hrs for pain in your neck 60 tablet 0  . Multiple Vitamin (MULTIVITAMIN WITH MINERALS) TABS tablet Take 1 tablet by mouth daily.     No current facility-administered medications for  this visit.      Physical Exam  Blood pressure 115/78, pulse 89, temperature 97.9 F (36.6 C), height 5\' 4"  (1.626 m), weight 146 lb (66.2 kg).  Constitutional: overall normal hygiene, normal nutrition, well developed, normal grooming, normal body habitus. Assistive device:none  Musculoskeletal: gait and station Limp none, muscle tone and strength are normal, no tremors or atrophy is present.  .  Neurological: coordination overall normal.  Deep tendon reflex/nerve stretch intact.  Sensation normal.  Cranial nerves II-XII intact.   Skin:   Normal overall no scars, lesions, ulcers or rashes. No psoriasis.  Psychiatric: Alert and oriented x 3.  Recent memory intact, remote memory unclear.  Normal mood and affect. Well groomed.  Good eye contact.  Cardiovascular: overall no swelling, no varicosities, no edema bilaterally, normal temperatures of the legs and arms, no clubbing, cyanosis and good capillary refill.  Lymphatic: palpation is normal.  All other systems reviewed and are negative   Examination of right Upper Extremity is done.  Inspection:   Overall:  Elbow non-tender without crepitus or defects, forearm non-tender without crepitus or defects, wrist non-tender without crepitus or defects, hand non-tender.    Shoulder: with glenohumeral joint tenderness, without effusion.   Upper arm: without swelling and tenderness   Range of motion:   Overall:  Full range of motion of  the elbow, full range of motion of wrist and full range of motion in fingers.   Shoulder:  right  170 degrees forward flexion; 160 degrees abduction; 35 degrees internal rotation, 35 degrees external rotation, 15 degrees extension, 40 degrees adduction.   Stability:   Overall:  Shoulder, elbow and wrist stable   Strength and Tone:   Overall full shoulder muscles strength, full upper arm strength and normal upper arm bulk and tone.  The patient has been educated about the nature of the problem(s) and  counseled on treatment options.  The patient appeared to understand what I have discussed and is in agreement with it.  Encounter Diagnoses  Name Primary?  . Chronic right shoulder pain Yes  . Cigarette nicotine dependence without complication     PLAN Call if any problems.  Precautions discussed.  Continue current medications.   Return to clinic 3 weeks   Begin PT.  Electronically Signed [name], MD 1/3/20191:58 PM

## 2017-09-06 DIAGNOSIS — M25611 Stiffness of right shoulder, not elsewhere classified: Secondary | ICD-10-CM | POA: Diagnosis not present

## 2017-09-06 DIAGNOSIS — M2560 Stiffness of unspecified joint, not elsewhere classified: Secondary | ICD-10-CM | POA: Diagnosis not present

## 2017-09-06 DIAGNOSIS — M25511 Pain in right shoulder: Secondary | ICD-10-CM | POA: Diagnosis not present

## 2017-09-06 DIAGNOSIS — M542 Cervicalgia: Secondary | ICD-10-CM | POA: Diagnosis not present

## 2017-09-11 ENCOUNTER — Telehealth: Payer: Self-pay | Admitting: Orthopaedic Surgery

## 2017-09-11 DIAGNOSIS — M25511 Pain in right shoulder: Secondary | ICD-10-CM | POA: Diagnosis not present

## 2017-09-11 DIAGNOSIS — M25611 Stiffness of right shoulder, not elsewhere classified: Secondary | ICD-10-CM | POA: Diagnosis not present

## 2017-09-11 DIAGNOSIS — M542 Cervicalgia: Secondary | ICD-10-CM | POA: Diagnosis not present

## 2017-09-11 DIAGNOSIS — M2560 Stiffness of unspecified joint, not elsewhere classified: Secondary | ICD-10-CM | POA: Diagnosis not present

## 2017-09-11 MED ORDER — HYDROCODONE-ACETAMINOPHEN 5-325 MG PO TABS: ORAL_TABLET | ORAL | 0 refills | Status: DC

## 2017-09-11 NOTE — Telephone Encounter (Signed)
Patient requests refill on Hydrocodone/Acetaminophen 5-325  Mgs.   Qty  28   Sig: One tablet every four hours as needed for acute pain. Limit of five days per [place] statue.  Patient uses [facility] in [place]

## 2017-09-13 DIAGNOSIS — M542 Cervicalgia: Secondary | ICD-10-CM | POA: Diagnosis not present

## 2017-09-13 DIAGNOSIS — M2560 Stiffness of unspecified joint, not elsewhere classified: Secondary | ICD-10-CM | POA: Diagnosis not present

## 2017-09-13 DIAGNOSIS — M25611 Stiffness of right shoulder, not elsewhere classified: Secondary | ICD-10-CM | POA: Diagnosis not present

## 2017-09-13 DIAGNOSIS — M25511 Pain in right shoulder: Secondary | ICD-10-CM | POA: Diagnosis not present

## 2017-09-18 DIAGNOSIS — M2560 Stiffness of unspecified joint, not elsewhere classified: Secondary | ICD-10-CM | POA: Diagnosis not present

## 2017-09-18 DIAGNOSIS — M25611 Stiffness of right shoulder, not elsewhere classified: Secondary | ICD-10-CM | POA: Diagnosis not present

## 2017-09-18 DIAGNOSIS — M25511 Pain in right shoulder: Secondary | ICD-10-CM | POA: Diagnosis not present

## 2017-09-18 DIAGNOSIS — M542 Cervicalgia: Secondary | ICD-10-CM | POA: Diagnosis not present

## 2017-09-20 ENCOUNTER — Encounter: Payer: Self-pay | Admitting: Orthopaedic Surgery

## 2017-09-20 ENCOUNTER — Ambulatory Visit (INDEPENDENT_AMBULATORY_CARE_PROVIDER_SITE_OTHER): Payer: BLUE CROSS/BLUE SHIELD | Admitting: Orthopaedic Surgery

## 2017-09-20 VITALS — BP 111/76 | HR 91 | Ht 63.0 in | Wt 146.0 lb

## 2017-09-20 DIAGNOSIS — F17210 Nicotine dependence, cigarettes, uncomplicated: Secondary | ICD-10-CM

## 2017-09-20 DIAGNOSIS — M2560 Stiffness of unspecified joint, not elsewhere classified: Secondary | ICD-10-CM | POA: Diagnosis not present

## 2017-09-20 DIAGNOSIS — G8929 Other chronic pain: Secondary | ICD-10-CM | POA: Diagnosis not present

## 2017-09-20 DIAGNOSIS — M542 Cervicalgia: Secondary | ICD-10-CM | POA: Diagnosis not present

## 2017-09-20 DIAGNOSIS — M25511 Pain in right shoulder: Secondary | ICD-10-CM

## 2017-09-20 DIAGNOSIS — M25611 Stiffness of right shoulder, not elsewhere classified: Secondary | ICD-10-CM | POA: Diagnosis not present

## 2017-09-20 MED ORDER — HYDROCODONE-ACETAMINOPHEN 5-325 MG PO TABS: ORAL_TABLET | ORAL | 0 refills | Status: DC

## 2017-09-20 NOTE — Progress Notes (Signed)
Patient SN:[id number] [name], female DOB:16-Jul-1966, 52 y.o. [id number]:[id number]  Chief Complaint  Patient presents with  . Shoulder Pain    right    HPI  [name] is a 52 y.o. female who has right shoulder pain.  She has been going to PT and is making good progress.  She still has a lot of pain with extension.  She has no numbness.  Her forward motion is better.She is pleased with her progress but still has more to make.   HPI  Body mass index is 25.86 kg/m.  ROS  Review of Systems  HENT: Negative for congestion.   Respiratory: Negative for cough and shortness of breath.   Cardiovascular: Negative for chest pain and leg swelling.       History of hypertension.  Endocrine: Positive for cold intolerance.  Musculoskeletal: Positive for arthralgias.  Allergic/Immunologic: Positive for environmental allergies.  All other systems reviewed and are negative.   Past Medical History:  Diagnosis Date  . Hypertension   . Vaginal Pap smear, abnormal     Past Surgical History:  Procedure Laterality Date  . EYE SURGERY    . TONSILLECTOMY      Family History  Problem Relation Age of Onset  . Hypertension Mother     Social History Social History   Tobacco Use  . Smoking status: Current Every Day Smoker    Packs/day: 0.50    Years: 30.00    Pack years: 15.00    Types: Cigarettes  . Smokeless tobacco: Never Used  Substance Use Topics  . Alcohol use: Yes    Comment: occassional, on the weekend  . Drug use: No    No Known Allergies  Current Outpatient Medications  Medication Sig Dispense Refill  . amLODipine (NORVASC) 5 MG tablet Take 5 mg by mouth daily.    Marland Kitchen HYDROcodone-acetaminophen (NORCO/VICODIN) 5-325 MG tablet One tablet every four hours as needed for acute pain.  Limit of five days per [place] statue. 30 tablet 0  . methocarbamol (ROBAXIN) 500 MG tablet Take 1 or 2 po Q 6hrs for pain in your neck 60 tablet 0  . Multiple Vitamin (MULTIVITAMIN WITH  MINERALS) TABS tablet Take 1 tablet by mouth daily.    . naproxen (NAPROSYN) 500 MG tablet Take 500 mg by mouth 2 (two) times daily with a meal.     No current facility-administered medications for this visit.      Physical Exam  Blood pressure 111/76, pulse 91, height 5\' 3"  (1.6 m), weight 146 lb (66.2 kg).  Constitutional: overall normal hygiene, normal nutrition, well developed, normal grooming, normal body habitus. Assistive device:none  Musculoskeletal: gait and station Limp none, muscle tone and strength are normal, no tremors or atrophy is present.  .  Neurological: coordination overall normal.  Deep tendon reflex/nerve stretch intact.  Sensation normal.  Cranial nerves II-XII intact.   Skin:   Normal overall no scars, lesions, ulcers or rashes. No psoriasis.  Psychiatric: Alert and oriented x 3.  Recent memory intact, remote memory unclear.  Normal mood and affect. Well groomed.  Good eye contact.  Cardiovascular: overall no swelling, no varicosities, no edema bilaterally, normal temperatures of the legs and arms, no clubbing, cyanosis and good capillary refill.  Lymphatic: palpation is normal.  Examination of right Upper Extremity is done.  Inspection:   Overall:  Elbow non-tender without crepitus or defects, forearm non-tender without crepitus or defects, wrist non-tender without crepitus or defects, hand non-tender.    Shoulder:  with glenohumeral joint tenderness, without effusion.   Upper arm: without swelling and tenderness   Range of motion:   Overall:  Full range of motion of the elbow, full range of motion of wrist and full range of motion in fingers.   Shoulder:  right  165 degrees forward flexion; 150 degrees abduction; 10 degrees internal rotation, 30 degrees external rotation, 30 degrees extension, 40 degrees adduction.   Stability:   Overall:  Shoulder, elbow and wrist stable   Strength and Tone:   Overall full shoulder muscles strength, full upper arm  strength and normal upper arm bulk and tone. All other systems reviewed and are negative   The patient has been educated about the nature of the problem(s) and counseled on treatment options.  The patient appeared to understand what I have discussed and is in agreement with it.  Encounter Diagnoses  Name Primary?  . Chronic right shoulder pain Yes  . Cigarette nicotine dependence without complication     PLAN Call if any problems.  Precautions discussed.  Continue current medications.   Return to clinic 2 weeks   Continue OT.  Electronically Signed [name], MD 1/24/20192:34 PM

## 2017-09-20 NOTE — Patient Instructions (Signed)
Steps to Quit Smoking Smoking tobacco can be bad for your health. It can also affect almost every organ in your body. Smoking puts you and people around you at risk for many serious long-lasting (chronic) diseases. Quitting smoking is hard, but it is one of the best things that you can do for your health. It is never too late to quit. What are the benefits of quitting smoking? When you quit smoking, you lower your risk for getting serious diseases and conditions. They can include:  Lung cancer or lung disease.  Heart disease.  Stroke.  Heart attack.  Not being able to have children (infertility).  Weak bones (osteoporosis) and broken bones (fractures).  If you have coughing, wheezing, and shortness of breath, those symptoms may get better when you quit. You may also get sick less often. If you are pregnant, quitting smoking can help to lower your chances of having a baby of low birth weight. What can I do to help me quit smoking? Talk with your doctor about what can help you quit smoking. Some things you can do (strategies) include:  Quitting smoking totally, instead of slowly cutting back how much you smoke over a period of time.  Going to in-person counseling. You are more likely to quit if you go to many counseling sessions.  Using resources and support systems, such as: ? Online chats with a counselor. ? Phone quitlines. ? Printed self-help materials. ? Support groups or group counseling. ? Text messaging programs. ? Mobile phone apps or applications.  Taking medicines. Some of these medicines may have nicotine in them. If you are pregnant or breastfeeding, do not take any medicines to quit smoking unless your doctor says it is okay. Talk with your doctor about counseling or other things that can help you.  Talk with your doctor about using more than one strategy at the same time, such as taking medicines while you are also going to in-person counseling. This can help make  quitting easier. What things can I do to make it easier to quit? Quitting smoking might feel very hard at first, but there is a lot that you can do to make it easier. Take these steps:  Talk to your family and friends. Ask them to support and encourage you.  Call phone quitlines, reach out to support groups, or work with a counselor.  Ask people who smoke to not smoke around you.  Avoid places that make you want (trigger) to smoke, such as: ? Bars. ? Parties. ? Smoke-break areas at work.  Spend time with people who do not smoke.  Lower the stress in your life. Stress can make you want to smoke. Try these things to help your stress: ? Getting regular exercise. ? Deep-breathing exercises. ? Yoga. ? Meditating. ? Doing a body scan. To do this, close your eyes, focus on one area of your body at a time from head to toe, and notice which parts of your body are tense. Try to relax the muscles in those areas.  Download or buy apps on your mobile phone or tablet that can help you stick to your quit plan. There are many free apps, such as QuitGuide from the CDC (Centers for Disease Control and Prevention). You can find more support from smokefree.gov and other websites.  This information is not intended to replace advice given to you by your health care provider. Make sure you discuss any questions you have with your health care provider. Document Released: 06/10/2009 Document   Revised: 04/11/2016 Document Reviewed: 12/29/2014 Elsevier Interactive Patient Education  2018 Elsevier Inc.  

## 2017-09-20 NOTE — Addendum Note (Signed)
Addended by: Willette Pa on: 09/20/2017 02:47 PM   Modules accepted: Orders

## 2017-09-21 ENCOUNTER — Telehealth: Payer: Self-pay | Admitting: Orthopaedic Surgery

## 2017-09-21 NOTE — Telephone Encounter (Signed)
Faxed updated notes/form to patient's short-term disability insurer, on 09/20/17. [employer] at [phone]; signed authorization on file (scanned in)  Patient aware.

## 2017-09-26 DIAGNOSIS — M25511 Pain in right shoulder: Secondary | ICD-10-CM | POA: Diagnosis not present

## 2017-09-26 DIAGNOSIS — M542 Cervicalgia: Secondary | ICD-10-CM | POA: Diagnosis not present

## 2017-09-26 DIAGNOSIS — M25611 Stiffness of right shoulder, not elsewhere classified: Secondary | ICD-10-CM | POA: Diagnosis not present

## 2017-09-26 DIAGNOSIS — M2560 Stiffness of unspecified joint, not elsewhere classified: Secondary | ICD-10-CM | POA: Diagnosis not present

## 2017-09-27 DIAGNOSIS — M25611 Stiffness of right shoulder, not elsewhere classified: Secondary | ICD-10-CM | POA: Diagnosis not present

## 2017-09-27 DIAGNOSIS — M542 Cervicalgia: Secondary | ICD-10-CM | POA: Diagnosis not present

## 2017-09-27 DIAGNOSIS — M25511 Pain in right shoulder: Secondary | ICD-10-CM | POA: Diagnosis not present

## 2017-09-27 DIAGNOSIS — M2560 Stiffness of unspecified joint, not elsewhere classified: Secondary | ICD-10-CM | POA: Diagnosis not present

## 2017-10-02 DIAGNOSIS — M25611 Stiffness of right shoulder, not elsewhere classified: Secondary | ICD-10-CM | POA: Diagnosis not present

## 2017-10-02 DIAGNOSIS — M25511 Pain in right shoulder: Secondary | ICD-10-CM | POA: Diagnosis not present

## 2017-10-02 DIAGNOSIS — M542 Cervicalgia: Secondary | ICD-10-CM | POA: Diagnosis not present

## 2017-10-02 DIAGNOSIS — M2560 Stiffness of unspecified joint, not elsewhere classified: Secondary | ICD-10-CM | POA: Diagnosis not present

## 2017-10-04 ENCOUNTER — Encounter: Payer: Self-pay | Admitting: Orthopaedic Surgery

## 2017-10-04 ENCOUNTER — Ambulatory Visit: Payer: BLUE CROSS/BLUE SHIELD | Admitting: Orthopaedic Surgery

## 2017-10-04 VITALS — BP 134/98 | HR 79 | Ht 63.0 in | Wt 146.0 lb

## 2017-10-04 DIAGNOSIS — M25511 Pain in right shoulder: Secondary | ICD-10-CM | POA: Diagnosis not present

## 2017-10-04 DIAGNOSIS — G8929 Other chronic pain: Secondary | ICD-10-CM | POA: Diagnosis not present

## 2017-10-04 NOTE — Patient Instructions (Signed)
Steps to Quit Smoking Smoking tobacco can be bad for your health. It can also affect almost every organ in your body. Smoking puts you and people around you at risk for many serious Tityana Pagan-lasting (chronic) diseases. Quitting smoking is hard, but it is one of the best things that you can do for your health. It is never too late to quit. What are the benefits of quitting smoking? When you quit smoking, you lower your risk for getting serious diseases and conditions. They can include:  Lung cancer or lung disease.  Heart disease.  Stroke.  Heart attack.  Not being able to have children (infertility).  Weak bones (osteoporosis) and broken bones (fractures).  If you have coughing, wheezing, and shortness of breath, those symptoms may get better when you quit. You may also get sick less often. If you are pregnant, quitting smoking can help to lower your chances of having a baby of low birth weight. What can I do to help me quit smoking? Talk with your doctor about what can help you quit smoking. Some things you can do (strategies) include:  Quitting smoking totally, instead of slowly cutting back how much you smoke over a period of time.  Going to in-person counseling. You are more likely to quit if you go to many counseling sessions.  Using resources and support systems, such as: ? Online chats with a counselor. ? Phone quitlines. ? Printed self-help materials. ? Support groups or group counseling. ? Text messaging programs. ? Mobile phone apps or applications.  Taking medicines. Some of these medicines may have nicotine in them. If you are pregnant or breastfeeding, do not take any medicines to quit smoking unless your doctor says it is okay. Talk with your doctor about counseling or other things that can help you.  Talk with your doctor about using more than one strategy at the same time, such as taking medicines while you are also going to in-person counseling. This can help make  quitting easier. What things can I do to make it easier to quit? Quitting smoking might feel very hard at first, but there is a lot that you can do to make it easier. Take these steps:  Talk to your family and friends. Ask them to support and encourage you.  Call phone quitlines, reach out to support groups, or work with a counselor.  Ask people who smoke to not smoke around you.  Avoid places that make you want (trigger) to smoke, such as: ? Bars. ? Parties. ? Smoke-break areas at work.  Spend time with people who do not smoke.  Lower the stress in your life. Stress can make you want to smoke. Try these things to help your stress: ? Getting regular exercise. ? Deep-breathing exercises. ? Yoga. ? Meditating. ? Doing a body scan. To do this, close your eyes, focus on one area of your body at a time from head to toe, and notice which parts of your body are tense. Try to relax the muscles in those areas.  Download or buy apps on your mobile phone or tablet that can help you stick to your quit plan. There are many free apps, such as QuitGuide from the CDC (Centers for Disease Control and Prevention). You can find more support from smokefree.gov and other websites.  This information is not intended to replace advice given to you by your health care provider. Make sure you discuss any questions you have with your health care provider. Document Released: 06/10/2009 Document   Revised: 04/11/2016 Document Reviewed: 12/29/2014 Elsevier Interactive Patient Education  2018 Elsevier Inc.  

## 2017-10-04 NOTE — Progress Notes (Signed)
CC:  I do not hurt  She has full ROM of the right shoulder, no pain, NV intact.   She has completed her PT.  She knows the exercises to do.  Encounter Diagnosis  Name Primary?  . Chronic right shoulder pain Yes   Discharge.  Call if any problem.  Precautions discussed.   Electronically Signed [name], MD 2/7/20192:42 PM

## 2017-10-31 ENCOUNTER — Other Ambulatory Visit: Payer: Self-pay | Admitting: Orthopaedic Surgery

## 2017-10-31 MED ORDER — HYDROCODONE-ACETAMINOPHEN 5-325 MG PO TABS: ORAL_TABLET | ORAL | 0 refills | Status: DC

## 2017-10-31 NOTE — Telephone Encounter (Signed)
Patient of Dr. Brooke Bonito requests refill on Hydrocodone/Acetaminophen 5-325   Mgs.   Qty  30  Sig: One tablet every four hours as needed for acute pain. Limit of five days per  statue.  Patient states she uses [facility] on [address]. In [place]

## 2017-11-08 DIAGNOSIS — E559 Vitamin D deficiency, unspecified: Secondary | ICD-10-CM | POA: Diagnosis not present

## 2017-11-08 DIAGNOSIS — I10 Essential (primary) hypertension: Secondary | ICD-10-CM | POA: Diagnosis not present

## 2017-11-08 DIAGNOSIS — Z0000 Encounter for general adult medical examination without abnormal findings: Secondary | ICD-10-CM | POA: Diagnosis not present

## 2017-11-08 DIAGNOSIS — Z1389 Encounter for screening for other disorder: Secondary | ICD-10-CM | POA: Diagnosis not present

## 2017-11-08 DIAGNOSIS — Z6828 Body mass index (BMI) 28.0-28.9, adult: Secondary | ICD-10-CM | POA: Diagnosis not present

## 2017-11-08 DIAGNOSIS — E663 Overweight: Secondary | ICD-10-CM | POA: Diagnosis not present

## 2017-11-08 DIAGNOSIS — M25561 Pain in right knee: Secondary | ICD-10-CM | POA: Diagnosis not present

## 2017-11-08 DIAGNOSIS — Z0001 Encounter for general adult medical examination with abnormal findings: Secondary | ICD-10-CM | POA: Diagnosis not present

## 2017-11-09 ENCOUNTER — Other Ambulatory Visit (HOSPITAL_COMMUNITY): Payer: Self-pay | Admitting: Physician Assistant

## 2017-11-09 ENCOUNTER — Ambulatory Visit (HOSPITAL_COMMUNITY)
Admission: RE | Admit: 2017-11-09 | Discharge: 2017-11-09 | Disposition: A | Discharge status: Home / Self Care | Payer: BLUE CROSS/BLUE SHIELD | Source: Ambulatory Visit | Attending: Physician Assistant | Admitting: Physician Assistant

## 2017-11-09 DIAGNOSIS — E559 Vitamin D deficiency, unspecified: Secondary | ICD-10-CM | POA: Diagnosis not present

## 2017-11-09 DIAGNOSIS — Z1389 Encounter for screening for other disorder: Secondary | ICD-10-CM | POA: Insufficient documentation

## 2017-11-09 DIAGNOSIS — Z1231 Encounter for screening mammogram for malignant neoplasm of breast: Secondary | ICD-10-CM

## 2017-11-09 DIAGNOSIS — M25561 Pain in right knee: Secondary | ICD-10-CM | POA: Diagnosis not present

## 2017-11-14 ENCOUNTER — Ambulatory Visit (HOSPITAL_COMMUNITY)
Admission: RE | Admit: 2017-11-14 | Discharge: 2017-11-14 | Disposition: A | Discharge status: Home / Self Care | Payer: BLUE CROSS/BLUE SHIELD | Source: Ambulatory Visit | Attending: Physician Assistant | Admitting: Physician Assistant

## 2017-11-14 DIAGNOSIS — Z1231 Encounter for screening mammogram for malignant neoplasm of breast: Secondary | ICD-10-CM | POA: Insufficient documentation

## 2017-11-26 ENCOUNTER — Other Ambulatory Visit (HOSPITAL_COMMUNITY): Payer: Self-pay | Admitting: Family Medicine

## 2017-11-26 DIAGNOSIS — R928 Other abnormal and inconclusive findings on diagnostic imaging of breast: Secondary | ICD-10-CM

## 2017-12-04 ENCOUNTER — Ambulatory Visit (HOSPITAL_COMMUNITY)
Admission: RE | Admit: 2017-12-04 | Discharge: 2017-12-04 | Disposition: A | Discharge status: Home / Self Care | Payer: BLUE CROSS/BLUE SHIELD | Source: Ambulatory Visit | Attending: Family Medicine | Admitting: Family Medicine

## 2017-12-04 DIAGNOSIS — R928 Other abnormal and inconclusive findings on diagnostic imaging of breast: Secondary | ICD-10-CM | POA: Diagnosis not present

## 2018-01-15 DIAGNOSIS — H3540 Unspecified peripheral retinal degeneration: Secondary | ICD-10-CM | POA: Diagnosis not present

## 2018-03-12 ENCOUNTER — Encounter: Payer: Self-pay | Admitting: Orthopaedic Surgery

## 2018-03-12 ENCOUNTER — Ambulatory Visit (INDEPENDENT_AMBULATORY_CARE_PROVIDER_SITE_OTHER): Payer: BLUE CROSS/BLUE SHIELD | Admitting: Orthopaedic Surgery

## 2018-03-12 VITALS — BP 128/92 | HR 77 | Temp 97.8°F | Ht 63.0 in | Wt 145.0 lb

## 2018-03-12 DIAGNOSIS — M25511 Pain in right shoulder: Secondary | ICD-10-CM | POA: Diagnosis not present

## 2018-03-12 DIAGNOSIS — G8929 Other chronic pain: Secondary | ICD-10-CM | POA: Diagnosis not present

## 2018-03-12 DIAGNOSIS — F17210 Nicotine dependence, cigarettes, uncomplicated: Secondary | ICD-10-CM | POA: Diagnosis not present

## 2018-03-12 MED ORDER — HYDROCODONE-ACETAMINOPHEN 5-325 MG PO TABS: ORAL_TABLET | ORAL | 0 refills | Status: DC

## 2018-03-12 NOTE — Progress Notes (Signed)
Patient [name], female DOB:Jan 27, 1966, 52 y.o. ZHG:[id number]  Chief Complaint  Patient presents with  . Shoulder Pain    right     HPI  [name] is a 52 y.o. female who has had a recurrence of pain in the right shoulder.  She says it began Sunday.  She had done a lot of cooking and noticed her shoulder was hurting again.  She had this problem last year and it took a while to get better.  She does not want it to become a big problem.  She would like to have PT again.  I will resume her NSAID and give short term pain medicine.  She is agreeable to this.   Body mass index is 25.69 kg/m.  ROS  Review of Systems  HENT: Negative for congestion.   Respiratory: Negative for cough and shortness of breath.   Cardiovascular: Negative for chest pain and leg swelling.       History of hypertension.  Endocrine: Positive for cold intolerance.  Musculoskeletal: Positive for arthralgias.  Allergic/Immunologic: Positive for environmental allergies.  All other systems reviewed and are negative.   All other systems reviewed and are negative.  Past Medical History:  Diagnosis Date  . Hypertension   . Vaginal Pap smear, abnormal     Past Surgical History:  Procedure Laterality Date  . EYE SURGERY    . TONSILLECTOMY      Family History  Problem Relation Age of Onset  . Hypertension Mother     Social History Social History   Tobacco Use  . Smoking status: Current Every Day Smoker    Packs/day: 0.50    Years: 30.00    Pack years: 15.00    Types: Cigarettes  . Smokeless tobacco: Never Used  Substance Use Topics  . Alcohol use: Yes    Comment: occassional, on the weekend  . Drug use: No    No Known Allergies  Current Outpatient Medications  Medication Sig Dispense Refill  . amLODipine (NORVASC) 5 MG tablet Take 5 mg by mouth daily.    Marland Kitchen HYDROcodone-acetaminophen (NORCO/VICODIN) 5-325 MG tablet One tablet every four hours as needed for acute pain.   Limit of five days per [place] statue. 30 tablet 0  . methocarbamol (ROBAXIN) 500 MG tablet Take 1 or 2 po Q 6hrs for pain in your neck 60 tablet 0  . Multiple Vitamin (MULTIVITAMIN WITH MINERALS) TABS tablet Take 1 tablet by mouth daily.    . naproxen (NAPROSYN) 500 MG tablet Take 500 mg by mouth 2 (two) times daily with a meal.     No current facility-administered medications for this visit.      Physical Exam  Blood pressure (!) 128/92, pulse 77, temperature 97.8 F (36.6 C), height 5\' 3"  (1.6 m), weight 145 lb (65.8 kg).  Constitutional: overall normal hygiene, normal nutrition, well developed, normal grooming, normal body habitus. Assistive device:none  Musculoskeletal: gait and station Limp none, muscle tone and strength are normal, no tremors or atrophy is present.  .  Neurological: coordination overall normal.  Deep tendon reflex/nerve stretch intact.  Sensation normal.  Cranial nerves II-XII intact.   Skin:   Normal overall no scars, lesions, ulcers or rashes. No psoriasis.  Psychiatric: Alert and oriented x 3.  Recent memory intact, remote memory unclear.  Normal mood and affect. Well groomed.  Good eye contact.  Cardiovascular: overall no swelling, no varicosities, no edema bilaterally, normal temperatures of the legs and arms, no clubbing,  cyanosis and good capillary refill.  Lymphatic: palpation is normal.  The right shoulder has full motions but is tender in the extremes.  NV intact.  All other systems reviewed and are negative   The patient has been educated about the nature of the problem(s) and counseled on treatment options.  The patient appeared to understand what I have discussed and is in agreement with it.  Encounter Diagnoses  Name Primary?  . Chronic right shoulder pain Yes  . Cigarette nicotine dependence without complication     PLAN Call if any problems.  Precautions discussed.  Begin pain medicine and Naprosyn.    Return to clinic 2  weeks   Begin PT.  I have reviewed the [place] web site prior to prescribing narcotic medicine for this patient.  Electronically Signed [name], MD 7/16/201911:46 AM

## 2018-03-14 DIAGNOSIS — M25511 Pain in right shoulder: Secondary | ICD-10-CM | POA: Diagnosis not present

## 2018-03-14 DIAGNOSIS — M542 Cervicalgia: Secondary | ICD-10-CM | POA: Diagnosis not present

## 2018-03-14 DIAGNOSIS — M79644 Pain in right finger(s): Secondary | ICD-10-CM | POA: Diagnosis not present

## 2018-03-14 DIAGNOSIS — M5412 Radiculopathy, cervical region: Secondary | ICD-10-CM | POA: Diagnosis not present

## 2018-03-18 DIAGNOSIS — M25511 Pain in right shoulder: Secondary | ICD-10-CM | POA: Diagnosis not present

## 2018-03-18 DIAGNOSIS — M542 Cervicalgia: Secondary | ICD-10-CM | POA: Diagnosis not present

## 2018-03-18 DIAGNOSIS — M5412 Radiculopathy, cervical region: Secondary | ICD-10-CM | POA: Diagnosis not present

## 2018-03-18 DIAGNOSIS — M79644 Pain in right finger(s): Secondary | ICD-10-CM | POA: Diagnosis not present

## 2018-03-21 DIAGNOSIS — M5412 Radiculopathy, cervical region: Secondary | ICD-10-CM | POA: Diagnosis not present

## 2018-03-21 DIAGNOSIS — M25511 Pain in right shoulder: Secondary | ICD-10-CM | POA: Diagnosis not present

## 2018-03-21 DIAGNOSIS — M79644 Pain in right finger(s): Secondary | ICD-10-CM | POA: Diagnosis not present

## 2018-03-21 DIAGNOSIS — M542 Cervicalgia: Secondary | ICD-10-CM | POA: Diagnosis not present

## 2018-03-22 DIAGNOSIS — M542 Cervicalgia: Secondary | ICD-10-CM | POA: Diagnosis not present

## 2018-03-22 DIAGNOSIS — M5412 Radiculopathy, cervical region: Secondary | ICD-10-CM | POA: Diagnosis not present

## 2018-03-22 DIAGNOSIS — M25511 Pain in right shoulder: Secondary | ICD-10-CM | POA: Diagnosis not present

## 2018-03-22 DIAGNOSIS — M79644 Pain in right finger(s): Secondary | ICD-10-CM | POA: Diagnosis not present

## 2018-03-26 ENCOUNTER — Encounter: Payer: Self-pay | Admitting: Orthopaedic Surgery

## 2018-03-26 ENCOUNTER — Ambulatory Visit (INDEPENDENT_AMBULATORY_CARE_PROVIDER_SITE_OTHER): Payer: BLUE CROSS/BLUE SHIELD | Admitting: Orthopaedic Surgery

## 2018-03-26 VITALS — BP 130/89 | HR 89 | Ht 63.0 in | Wt 145.0 lb

## 2018-03-26 DIAGNOSIS — G8929 Other chronic pain: Secondary | ICD-10-CM

## 2018-03-26 DIAGNOSIS — M5412 Radiculopathy, cervical region: Secondary | ICD-10-CM | POA: Diagnosis not present

## 2018-03-26 DIAGNOSIS — F17210 Nicotine dependence, cigarettes, uncomplicated: Secondary | ICD-10-CM

## 2018-03-26 DIAGNOSIS — M542 Cervicalgia: Secondary | ICD-10-CM | POA: Diagnosis not present

## 2018-03-26 DIAGNOSIS — M25511 Pain in right shoulder: Secondary | ICD-10-CM | POA: Diagnosis not present

## 2018-03-26 DIAGNOSIS — M79644 Pain in right finger(s): Secondary | ICD-10-CM | POA: Diagnosis not present

## 2018-03-26 MED ORDER — HYDROCODONE-ACETAMINOPHEN 5-325 MG PO TABS: ORAL_TABLET | ORAL | 0 refills | Status: DC

## 2018-03-26 NOTE — Patient Instructions (Signed)

## 2018-03-26 NOTE — Progress Notes (Signed)
Patient LH:TDSKAJGOT [name], female DOB:[birth year], 52 y.o. LXB:[id number]  Chief Complaint  Patient presents with  . Shoulder Pain    Right    HPI  [name] is a 52 y.o. female who has continue pain of the right shoulder.  She is going to PT and is making good progress.  I have reviewed the PT notes.  She is doing her exercises at home and taking her medicine.  She has no new trauma.   Body mass index is 25.69 kg/m.  ROS  Review of Systems  HENT: Negative for congestion.   Respiratory: Negative for cough and shortness of breath.   Cardiovascular: Negative for chest pain and leg swelling.       History of hypertension.  Endocrine: Positive for cold intolerance.  Musculoskeletal: Positive for arthralgias.  Allergic/Immunologic: Positive for environmental allergies.  All other systems reviewed and are negative.   All other systems reviewed and are negative.  Past Medical History:  Diagnosis Date  . Hypertension   . Vaginal Pap smear, abnormal     Past Surgical History:  Procedure Laterality Date  . EYE SURGERY    . TONSILLECTOMY      Family History  Problem Relation Age of Onset  . Hypertension Mother     Social History Social History   Tobacco Use  . Smoking status: Current Every Day Smoker    Packs/day: 0.50    Years: 30.00    Pack years: 15.00    Types: Cigarettes  . Smokeless tobacco: Never Used  Substance Use Topics  . Alcohol use: Yes    Comment: occassional, on the weekend  . Drug use: No    No Known Allergies  Current Outpatient Medications  Medication Sig Dispense Refill  . amLODipine (NORVASC) 5 MG tablet Take 5 mg by mouth daily.    Marland Kitchen HYDROcodone-acetaminophen (NORCO/VICODIN) 5-325 MG tablet One tablet every four hours as needed for acute pain.  Limit of five days per [place] statue. 30 tablet 0  . methocarbamol (ROBAXIN) 500 MG tablet Take 1 or 2 po Q 6hrs for pain in your neck 60 tablet 0  . Multiple Vitamin (MULTIVITAMIN  WITH MINERALS) TABS tablet Take 1 tablet by mouth daily.    . naproxen (NAPROSYN) 500 MG tablet Take 500 mg by mouth 2 (two) times daily with a meal.     No current facility-administered medications for this visit.      Physical Exam  Blood pressure 130/89, pulse 89, height 5\' 3"  (1.6 m), weight 145 lb (65.8 kg).  Constitutional: overall normal hygiene, normal nutrition, well developed, normal grooming, normal body habitus. Assistive device:none  Musculoskeletal: gait and station Limp none, muscle tone and strength are normal, no tremors or atrophy is present.  .  Neurological: coordination overall normal.  Deep tendon reflex/nerve stretch intact.  Sensation normal.  Cranial nerves II-XII intact.   Skin:   Normal overall no scars, lesions, ulcers or rashes. No psoriasis.  Psychiatric: Alert and oriented x 3.  Recent memory intact, remote memory unclear.  Normal mood and affect. Well groomed.  Good eye contact.  Cardiovascular: overall no swelling, no varicosities, no edema bilaterally, normal temperatures of the legs and arms, no clubbing, cyanosis and good capillary refill.  Lymphatic: palpation is normal.  Right shoulder with full motion but pain in the extremes.  NV intact.  All other systems reviewed and are negative   The patient has been educated about the nature of the problem(s) and counseled  on treatment options.  The patient appeared to understand what I have discussed and is in agreement with it.  Encounter Diagnoses  Name Primary?  . Chronic right shoulder pain Yes  . Cigarette nicotine dependence without complication     PLAN Call if any problems.  Precautions discussed.  Continue current medications.   Return to clinic 2 weeks   Continue PT.  I have reviewed the [place] web site prior to prescribing narcotic medicine for this patient.  Electronically Signed [name], MD 7/30/20191:52 PM

## 2018-03-28 DIAGNOSIS — M79644 Pain in right finger(s): Secondary | ICD-10-CM | POA: Diagnosis not present

## 2018-03-28 DIAGNOSIS — M25511 Pain in right shoulder: Secondary | ICD-10-CM | POA: Diagnosis not present

## 2018-03-28 DIAGNOSIS — M542 Cervicalgia: Secondary | ICD-10-CM | POA: Diagnosis not present

## 2018-03-28 DIAGNOSIS — M5412 Radiculopathy, cervical region: Secondary | ICD-10-CM | POA: Diagnosis not present

## 2018-04-04 DIAGNOSIS — M542 Cervicalgia: Secondary | ICD-10-CM | POA: Diagnosis not present

## 2018-04-04 DIAGNOSIS — M79644 Pain in right finger(s): Secondary | ICD-10-CM | POA: Diagnosis not present

## 2018-04-04 DIAGNOSIS — M5412 Radiculopathy, cervical region: Secondary | ICD-10-CM | POA: Diagnosis not present

## 2018-04-04 DIAGNOSIS — M25511 Pain in right shoulder: Secondary | ICD-10-CM | POA: Diagnosis not present

## 2018-04-05 DIAGNOSIS — M5412 Radiculopathy, cervical region: Secondary | ICD-10-CM | POA: Diagnosis not present

## 2018-04-05 DIAGNOSIS — M542 Cervicalgia: Secondary | ICD-10-CM | POA: Diagnosis not present

## 2018-04-05 DIAGNOSIS — M79644 Pain in right finger(s): Secondary | ICD-10-CM | POA: Diagnosis not present

## 2018-04-05 DIAGNOSIS — M25511 Pain in right shoulder: Secondary | ICD-10-CM | POA: Diagnosis not present

## 2018-04-09 ENCOUNTER — Ambulatory Visit (INDEPENDENT_AMBULATORY_CARE_PROVIDER_SITE_OTHER): Payer: BLUE CROSS/BLUE SHIELD | Admitting: Orthopaedic Surgery

## 2018-04-09 ENCOUNTER — Encounter: Payer: Self-pay | Admitting: Orthopaedic Surgery

## 2018-04-09 VITALS — BP 113/85 | HR 98 | Ht 63.0 in | Wt 144.0 lb

## 2018-04-09 DIAGNOSIS — M25511 Pain in right shoulder: Secondary | ICD-10-CM | POA: Diagnosis not present

## 2018-04-09 DIAGNOSIS — F17210 Nicotine dependence, cigarettes, uncomplicated: Secondary | ICD-10-CM

## 2018-04-09 DIAGNOSIS — G8929 Other chronic pain: Secondary | ICD-10-CM | POA: Diagnosis not present

## 2018-04-09 MED ORDER — HYDROCODONE-ACETAMINOPHEN 5-325 MG PO TABS: ORAL_TABLET | ORAL | 0 refills | Status: DC

## 2018-04-09 NOTE — Progress Notes (Signed)
Patient [name], female DOB:1966-02-08, 52 y.o. JIR:[id number]  Chief Complaint  Patient presents with  . Shoulder Pain    right     HPI  [name] is a 52 y.o. female who has right shoulder pain.  She has been to PT and has been doing her exercises. She has much less pain now.  She would like to return to work.  She needs to continue her exercises at home.  She still has some pain in the extremes of motion.  She has no numbness.   Body mass index is 25.51 kg/m.  ROS  Review of Systems  HENT: Negative for congestion.   Respiratory: Negative for cough and shortness of breath.   Cardiovascular: Negative for chest pain and leg swelling.       History of hypertension.  Endocrine: Positive for cold intolerance.  Musculoskeletal: Positive for arthralgias.  Allergic/Immunologic: Positive for environmental allergies.  All other systems reviewed and are negative.   All other systems reviewed and are negative.  Past Medical History:  Diagnosis Date  . Hypertension   . Vaginal Pap smear, abnormal     Past Surgical History:  Procedure Laterality Date  . EYE SURGERY    . TONSILLECTOMY      Family History  Problem Relation Age of Onset  . Hypertension Mother     Social History Social History   Tobacco Use  . Smoking status: Current Every Day Smoker    Packs/day: 0.50    Years: 30.00    Pack years: 15.00    Types: Cigarettes  . Smokeless tobacco: Never Used  Substance Use Topics  . Alcohol use: Yes    Comment: occassional, on the weekend  . Drug use: No    No Known Allergies  Current Outpatient Medications  Medication Sig Dispense Refill  . amLODipine (NORVASC) 5 MG tablet Take 5 mg by mouth daily.    Marland Kitchen HYDROcodone-acetaminophen (NORCO/VICODIN) 5-325 MG tablet One tablet every four hours as needed for acute pain.  Limit of five days per [place] statue. 30 tablet 0  . methocarbamol (ROBAXIN) 500 MG tablet Take 1 or 2 po Q 6hrs for pain in  your neck 60 tablet 0  . Multiple Vitamin (MULTIVITAMIN WITH MINERALS) TABS tablet Take 1 tablet by mouth daily.    . naproxen (NAPROSYN) 500 MG tablet Take 500 mg by mouth 2 (two) times daily with a meal.    . Vitamin D, Ergocalciferol, (DRISDOL) 50000 units CAPS capsule   0   No current facility-administered medications for this visit.      Physical Exam  Blood pressure 113/85, pulse 98, height 5\' 3"  (1.6 m), weight 144 lb (65.3 kg).  Constitutional: overall normal hygiene, normal nutrition, well developed, normal grooming, normal body habitus. Assistive device:none  Musculoskeletal: gait and station Limp none, muscle tone and strength are normal, no tremors or atrophy is present.  .  Neurological: coordination overall normal.  Deep tendon reflex/nerve stretch intact.  Sensation normal.  Cranial nerves II-XII intact.   Skin:   Normal overall no scars, lesions, ulcers or rashes. No psoriasis.  Psychiatric: Alert and oriented x 3.  Recent memory intact, remote memory unclear.  Normal mood and affect. Well groomed.  Good eye contact.  Cardiovascular: overall no swelling, no varicosities, no edema bilaterally, normal temperatures of the legs and arms, no clubbing, cyanosis and good capillary refill.  Lymphatic: palpation is normal.  Right shoulder with full motion but pains in the extremes.  NV intact.  Grips normal.  All other systems reviewed and are negative   The patient has been educated about the nature of the problem(s) and counseled on treatment options.  The patient appeared to understand what I have discussed and is in agreement with it.  Encounter Diagnoses  Name Primary?  . Chronic right shoulder pain Yes  . Cigarette nicotine dependence without complication     PLAN Call if any problems.  Precautions discussed.  Continue current medications.   Return to clinic 6 weeks   I have reviewed the [place] web site prior to  prescribing narcotic medicine for this patient.  The patient has read and signed an Opioid Treatment Agreement which has been scanned and added to the medical record.  The patient understands the agreement and agrees to abide with it.  The patient has chronic pain that is being treated with an opioid which relieves the pain.  The patient understands potential complications with chronic opioid treatment.  Return to work later today.  Electronically Signed [name], MD 8/13/20192:50 PM

## 2018-04-09 NOTE — Patient Instructions (Signed)
Go back to work as of today.

## 2018-04-15 DIAGNOSIS — M542 Cervicalgia: Secondary | ICD-10-CM | POA: Diagnosis not present

## 2018-04-15 DIAGNOSIS — M79644 Pain in right finger(s): Secondary | ICD-10-CM | POA: Diagnosis not present

## 2018-04-15 DIAGNOSIS — M5412 Radiculopathy, cervical region: Secondary | ICD-10-CM | POA: Diagnosis not present

## 2018-04-15 DIAGNOSIS — M25511 Pain in right shoulder: Secondary | ICD-10-CM | POA: Diagnosis not present

## 2018-05-21 ENCOUNTER — Ambulatory Visit: Payer: BLUE CROSS/BLUE SHIELD | Admitting: Orthopaedic Surgery

## 2018-05-23 ENCOUNTER — Ambulatory Visit (INDEPENDENT_AMBULATORY_CARE_PROVIDER_SITE_OTHER): Payer: BLUE CROSS/BLUE SHIELD | Admitting: Orthopaedic Surgery

## 2018-05-23 ENCOUNTER — Encounter: Payer: Self-pay | Admitting: Orthopaedic Surgery

## 2018-05-23 VITALS — BP 116/81 | HR 71 | Ht 63.0 in | Wt 141.0 lb

## 2018-05-23 DIAGNOSIS — F17210 Nicotine dependence, cigarettes, uncomplicated: Secondary | ICD-10-CM

## 2018-05-23 DIAGNOSIS — G8929 Other chronic pain: Secondary | ICD-10-CM

## 2018-05-23 DIAGNOSIS — M25511 Pain in right shoulder: Secondary | ICD-10-CM

## 2018-05-23 MED ORDER — HYDROCODONE-ACETAMINOPHEN 5-325 MG PO TABS: ORAL_TABLET | ORAL | 0 refills | Status: DC

## 2018-05-23 NOTE — Progress Notes (Signed)
CC:  My shoulder is better  She has less pain of the right shoulder.  She has more pain at night.  She has no numbness, no new trauma.  She has full motion of the right shoulder with pain in the extremes.  NV intact.  Encounter Diagnoses  Name Primary?  . Chronic right shoulder pain Yes  . Cigarette nicotine dependence without complication    I will see as needed.  Call if any problem.  Precautions discussed.   I have reviewed the [place] web site prior to prescribing narcotic medicine for this patient.   Electronically Signed [name], MD 9/26/20192:20 PM

## 2018-10-02 ENCOUNTER — Emergency Department (HOSPITAL_COMMUNITY)
Admission: EM | Admit: 2018-10-02 | Discharge: 2018-10-02 | Disposition: A | Discharge status: Home / Self Care | Payer: BLUE CROSS/BLUE SHIELD | Attending: Emergency Medicine | Admitting: Emergency Medicine

## 2018-10-02 ENCOUNTER — Other Ambulatory Visit: Payer: Self-pay

## 2018-10-02 ENCOUNTER — Encounter (HOSPITAL_COMMUNITY): Payer: Self-pay | Admitting: Emergency Medicine

## 2018-10-02 DIAGNOSIS — J019 Acute sinusitis, unspecified: Secondary | ICD-10-CM

## 2018-10-02 DIAGNOSIS — R05 Cough: Secondary | ICD-10-CM | POA: Diagnosis not present

## 2018-10-02 DIAGNOSIS — Z79899 Other long term (current) drug therapy: Secondary | ICD-10-CM | POA: Insufficient documentation

## 2018-10-02 DIAGNOSIS — I10 Essential (primary) hypertension: Secondary | ICD-10-CM | POA: Diagnosis not present

## 2018-10-02 DIAGNOSIS — F17210 Nicotine dependence, cigarettes, uncomplicated: Secondary | ICD-10-CM | POA: Diagnosis not present

## 2018-10-02 DIAGNOSIS — R0981 Nasal congestion: Secondary | ICD-10-CM | POA: Diagnosis not present

## 2018-10-02 MED ORDER — BUDESONIDE 32 MCG/ACT NA SUSP: 2.0000 | Freq: Every day | NASAL | 0 refills | Status: DC

## 2018-10-02 MED ORDER — AMOXICILLIN 500 MG PO CAPS: 500.0000 mg | ORAL_CAPSULE | Freq: Three times a day (TID) | ORAL | 0 refills | Status: DC

## 2018-10-02 NOTE — ED Triage Notes (Signed)
Nasal congestion x 1 month and on and off headache

## 2018-10-02 NOTE — Discharge Instructions (Addendum)
Your blood pressure today was mildly elevated.  Be sure to take your blood pressure medicine every day try not to miss any doses.  Take the antibiotic as directed until its finished.  Use the nasal spray for at least 1 week.  You may also take Tylenol if needed for pain.  Follow-up with your primary doctor if needed.

## 2018-10-02 NOTE — ED Provider Notes (Signed)
[facility] EMERGENCY DEPARTMENT Provider Note   CSN: [id number] Arrival date & time: 10/02/18  1737     History   Chief Complaint Chief Complaint  Patient presents with  . Nasal Congestion    HPI [name] is a 53 y.o. female.  HPI   [name] is a 53 y.o. female who presents to the Emergency Department complaining of nasal congestion, sinus pressure and pain.  Symptoms have been present for 1 month, but states he has been having increasing nasal congestion and pressure in her face for 3 days.  She describes pain and pressure behind both eyes across her nose and into her forehead.  Symptoms have been associated with a frontal headache at times associated with sneezing.  No fever, cough, chest pain, neck pain or stiffness.  She has not taken any over-the-counter cold medications or decongestants.  She has a history of hypertension and states she has not taken her medications for 2 to 3 days.  Past Medical History:  Diagnosis Date  . Hypertension   . Vaginal Pap smear, abnormal     Patient Active Problem List   Diagnosis Date Noted  . Chronic right shoulder pain 02/07/2017  . Pain in joint, shoulder region 01/18/2016  . CIN I (cervical intraepithelial neoplasia I) 01/13/2016  . Right shoulder pain 10/05/2015  . Primary osteoarthritis of right foot 05/05/2014  . Ganglion cyst of foot 05/05/2014    Past Surgical History:  Procedure Laterality Date  . EYE SURGERY    . TONSILLECTOMY       OB History    Gravida  2   Para  1   Term      Preterm  1   AB  1   Living  1     SAB      TAB      Ectopic      Multiple      Live Births  1            Home Medications    Prior to Admission medications   Medication Sig Start Date End Date Taking? Authorizing Provider  amLODipine (NORVASC) 5 MG tablet Take 5 mg by mouth daily.    [provider]  HYDROcodone-acetaminophen (NORCO/VICODIN) 5-325 MG tablet One tablet every four  hours as needed for acute pain.  Limit of five days per [place] statue. 05/23/18   [name], MD  methocarbamol (ROBAXIN) 500 MG tablet Take 1 or 2 po Q 6hrs for pain in your neck 12/02/16   Rolland Porter, MD  Multiple Vitamin (MULTIVITAMIN WITH MINERALS) TABS tablet Take 1 tablet by mouth daily.    [provider]  naproxen (NAPROSYN) 500 MG tablet Take 500 mg by mouth 2 (two) times daily with a meal.    [provider]  Vitamin D, Ergocalciferol, (DRISDOL) 50000 units CAPS capsule  04/04/18   [provider]    Family History Family History  Problem Relation Age of Onset  . Hypertension Mother     Social History Social History   Tobacco Use  . Smoking status: Current Every Day Smoker    Packs/day: 0.50    Years: 30.00    Pack years: 15.00    Types: Cigarettes  . Smokeless tobacco: Never Used  Substance Use Topics  . Alcohol use: Yes    Comment: occassional, on the weekend  . Drug use: No     Allergies   Patient has no known allergies.  Review of Systems Review of Systems  Constitutional: Negative for activity change, appetite change, chills and fever.  HENT: Positive for congestion, rhinorrhea, sinus pressure, sinus pain and sneezing. Negative for facial swelling, sore throat and trouble swallowing.   Eyes: Negative for visual disturbance.  Respiratory: Positive for cough. Negative for chest tightness, shortness of breath, wheezing and stridor.   Cardiovascular: Negative for chest pain.  Gastrointestinal: Negative for nausea and vomiting.  Musculoskeletal: Negative for neck pain and neck stiffness.  Skin: Negative for rash.  Neurological: Positive for headaches. Negative for dizziness, weakness and numbness.  Hematological: Negative for adenopathy.  Psychiatric/Behavioral: Negative for confusion.     Physical Exam Updated Vital Signs BP (!) 130/100 (BP Location: Right Arm)   Pulse 88   Temp 98.4 F (36.9 C) (Oral)   Resp 18   Ht 5'  3" (1.6 m)   Wt 67.6 kg   SpO2 100%   BMI 26.39 kg/m   Physical Exam Vitals signs and nursing note reviewed.  Constitutional:      General: She is not in acute distress.    Appearance: Normal appearance. She is well-developed. She is not ill-appearing.  HENT:     Head: Normocephalic.     Jaw: No trismus.     Right Ear: Tympanic membrane and ear canal normal.     Left Ear: Tympanic membrane and ear canal normal.     Nose: Congestion and rhinorrhea present.     Right Turbinates: Enlarged.     Left Turbinates: Enlarged.     Right Sinus: Frontal sinus tenderness present.     Left Sinus: Frontal sinus tenderness present.     Mouth/Throat:     Pharynx: Uvula midline. Posterior oropharyngeal erythema present. No oropharyngeal exudate or uvula swelling.     Tonsils: No tonsillar abscesses.  Eyes:     Conjunctiva/sclera: Conjunctivae normal.  Neck:     Musculoskeletal: Normal range of motion and neck supple.     Trachea: Phonation normal.     Meningeal: Kernig's sign absent.  Cardiovascular:     Rate and Rhythm: Normal rate and regular rhythm.     Heart sounds: Normal heart sounds. No murmur.  Pulmonary:     Effort: Pulmonary effort is normal. No respiratory distress.     Breath sounds: Normal breath sounds. No wheezing or rales.  Abdominal:     General: There is no distension.     Palpations: Abdomen is soft.     Tenderness: There is no abdominal tenderness. There is no guarding or rebound.  Lymphadenopathy:     Cervical: No cervical adenopathy.  Skin:    General: Skin is warm and dry.  Neurological:     General: No focal deficit present.     Mental Status: She is alert. Mental status is at baseline.     Motor: No weakness or abnormal muscle tone.     Coordination: Coordination normal.      ED Treatments / Results  Labs (all labs ordered are listed, but only abnormal results are displayed) Labs Reviewed - No data to display  EKG None  Radiology No results  found.  Procedures Procedures (including critical care time)  Medications Ordered in ED Medications - No data to display   Initial Impression / Assessment and Plan / ED Course  I have reviewed the triage vital signs and the nursing notes.  Pertinent labs & imaging results that were available during my care of the patient were reviewed by me and  considered in my medical decision making (see chart for details).     Patient is well-appearing.  Vital signs reviewed and BP improved.  She is mildly hypertensive without symptoms other than sinus pressure and frontal headache.  No neurovascular deficits, no nuchal rigidity, chest pain or dyspnea.  She admits to not taking her blood pressure medications for a couple of days.  I have advised her to take her medications daily as directed and counseled about possible risks associated with uncontrolled hypertension.  Patient verbalized understanding.  Advised her not to use over-the-counter decongestants.  She appears appropriate for discharge home and agrees to treatment plan  Final Clinical Impressions(s) / ED Diagnoses   Final diagnoses:  Acute sinusitis, recurrence not specified, unspecified location  Essential hypertension    ED Discharge Orders    None       Kem Parkinson, PA-C 10/02/18 1937    Fredia Sorrow, MD 10/03/18 1539

## 2018-10-15 ENCOUNTER — Ambulatory Visit: Payer: BLUE CROSS/BLUE SHIELD | Admitting: Orthopaedic Surgery

## 2018-10-15 ENCOUNTER — Encounter: Payer: Self-pay | Admitting: Orthopaedic Surgery

## 2018-10-15 VITALS — BP 102/69 | HR 86 | Ht 63.0 in | Wt 141.0 lb

## 2018-10-15 DIAGNOSIS — M25511 Pain in right shoulder: Secondary | ICD-10-CM

## 2018-10-15 DIAGNOSIS — G8929 Other chronic pain: Secondary | ICD-10-CM

## 2018-10-15 DIAGNOSIS — F17210 Nicotine dependence, cigarettes, uncomplicated: Secondary | ICD-10-CM

## 2018-10-15 MED ORDER — NAPROXEN 500 MG PO TABS: 500.0000 mg | ORAL_TABLET | Freq: Two times a day (BID) | ORAL | 5 refills | Status: DC

## 2018-10-15 MED ORDER — HYDROCODONE-ACETAMINOPHEN 5-325 MG PO TABS: ORAL_TABLET | ORAL | 0 refills | Status: DC

## 2018-10-15 NOTE — Progress Notes (Signed)
Patient [name] [name], female DOB:[birth year], 53 y.o. [id number]  Chief Complaint  Patient presents with  . Shoulder Pain    Chronic right shoulder pain.    HPI  [name] is a 53 y.o. female who has recurrent right shoulder pain.  She has pain with the cold days we have had. She has pain with overhead use. She has no new trauma, no numbness.   Body mass index is 24.98 kg/m.  ROS  Review of Systems  HENT: Negative for congestion.   Respiratory: Negative for cough and shortness of breath.   Cardiovascular: Negative for chest pain and leg swelling.       History of hypertension.  Endocrine: Positive for cold intolerance.  Musculoskeletal: Positive for arthralgias.  Allergic/Immunologic: Positive for environmental allergies.  All other systems reviewed and are negative.   All other systems reviewed and are negative.  The following is a summary of the past history medically, past history surgically, known current medicines, social history and family history.  This information is gathered electronically by the computer from prior information and documentation.  I review this each visit and have found including this information at this point in the chart is beneficial and informative.    Past Medical History:  Diagnosis Date  . Hypertension   . Vaginal Pap smear, abnormal     Past Surgical History:  Procedure Laterality Date  . EYE SURGERY    . TONSILLECTOMY      Family History  Problem Relation Age of Onset  . Hypertension Mother     Social History Social History   Tobacco Use  . Smoking status: Current Every Day Smoker    Packs/day: 0.50    Years: 30.00    Pack years: 15.00    Types: Cigarettes  . Smokeless tobacco: Never Used  Substance Use Topics  . Alcohol use: Yes    Comment: occassional, on the weekend  . Drug use: No    No Known Allergies  Current Outpatient Medications  Medication Sig Dispense Refill  . amLODipine  (NORVASC) 5 MG tablet Take 5 mg by mouth daily.    Marland Kitchen amoxicillin (AMOXIL) 500 MG capsule Take 1 capsule (500 mg total) by mouth 3 (three) times daily. 21 capsule 0  . budesonide (RHINOCORT AQUA) 32 MCG/ACT nasal spray Place 2 sprays into both nostrils daily. 8.43 mL 0  . HYDROcodone-acetaminophen (NORCO/VICODIN) 5-325 MG tablet One tablet every four hours as needed for acute pain.  Limit of five days per [place] statue. 30 tablet 0  . methocarbamol (ROBAXIN) 500 MG tablet Take 1 or 2 po Q 6hrs for pain in your neck 60 tablet 0  . Multiple Vitamin (MULTIVITAMIN WITH MINERALS) TABS tablet Take 1 tablet by mouth daily.    . naproxen (NAPROSYN) 500 MG tablet Take 1 tablet (500 mg total) by mouth 2 (two) times daily with a meal. 60 tablet 5  . Vitamin D, Ergocalciferol, (DRISDOL) 50000 units CAPS capsule   0   No current facility-administered medications for this visit.      Physical Exam  Blood pressure 102/69, pulse 86, height 5\' 3"  (1.6 m), weight 141 lb (64 kg).  Constitutional: overall normal hygiene, normal nutrition, well developed, normal grooming, normal body habitus. Assistive device:none  Musculoskeletal: gait and station Limp none, muscle tone and strength are normal, no tremors or atrophy is present.  .  Neurological: coordination overall normal.  Deep tendon reflex/nerve stretch intact.  Sensation normal.  Cranial nerves II-XII  intact.   Skin:   Normal overall no scars, lesions, ulcers or rashes. No psoriasis.  Psychiatric: Alert and oriented x 3.  Recent memory intact, remote memory unclear.  Normal mood and affect. Well groomed.  Good eye contact.  Cardiovascular: overall no swelling, no varicosities, no edema bilaterally, normal temperatures of the legs and arms, no clubbing, cyanosis and good capillary refill.  Lymphatic: palpation is normal.  Right shoulder has full motion but pain in the extremes.  NV intact. ROM of the neck is full.    All other systems reviewed and  are negative   The patient has been educated about the nature of the problem(s) and counseled on treatment options.  The patient appeared to understand what I have discussed and is in agreement with it.  Encounter Diagnoses  Name Primary?  . Chronic right shoulder pain Yes  . Cigarette nicotine dependence without complication     PLAN Call if any problems.  Precautions discussed.  Continue current medications.   Return to clinic 3 weeks   I have reviewed the [place] web site prior to prescribing narcotic medicine for this patient.   Electronically Signed [name], MD 2/18/202011:09 AM

## 2018-11-05 ENCOUNTER — Encounter: Payer: Self-pay | Admitting: Orthopaedic Surgery

## 2018-11-05 ENCOUNTER — Ambulatory Visit: Payer: BLUE CROSS/BLUE SHIELD | Admitting: Orthopaedic Surgery

## 2018-11-05 VITALS — BP 124/90 | HR 94 | Ht 63.0 in | Wt 147.0 lb

## 2018-11-05 DIAGNOSIS — G8929 Other chronic pain: Secondary | ICD-10-CM | POA: Diagnosis not present

## 2018-11-05 DIAGNOSIS — F17210 Nicotine dependence, cigarettes, uncomplicated: Secondary | ICD-10-CM | POA: Diagnosis not present

## 2018-11-05 DIAGNOSIS — M25511 Pain in right shoulder: Secondary | ICD-10-CM

## 2018-11-05 MED ORDER — HYDROCODONE-ACETAMINOPHEN 5-325 MG PO TABS: ORAL_TABLET | ORAL | 0 refills | Status: DC

## 2018-11-05 NOTE — Progress Notes (Signed)
Patient YN:[name] [name], female DOB:[birth year], 53 y.o. QMV:[id number]  Chief Complaint  Patient presents with  . Shoulder Pain    Chronic right shoulder pain.    HPI  JOELI Wilson is a 53 y.o. female who has right shoulder pain that is improved.  She has pain in the evening. She has been doing her exercises.  She has no paresthesias.  She has no new trauma.     Body mass index is 26.04 kg/m.  ROS  Review of Systems  HENT: Negative for congestion.   Respiratory: Negative for cough and shortness of breath.   Cardiovascular: Negative for chest pain and leg swelling.       History of hypertension.  Endocrine: Positive for cold intolerance.  Musculoskeletal: Positive for arthralgias.  Allergic/Immunologic: Positive for environmental allergies.  All other systems reviewed and are negative.   All other systems reviewed and are negative.  The following is a summary of the past history medically, past history surgically, known current medicines, social history and family history.  This information is gathered electronically by the computer from prior information and documentation.  I review this each visit and have found including this information at this point in the chart is beneficial and informative.    Past Medical History:  Diagnosis Date  . Hypertension   . Vaginal Pap smear, abnormal     Past Surgical History:  Procedure Laterality Date  . EYE SURGERY    . TONSILLECTOMY      Family History  Problem Relation Age of Onset  . Hypertension Mother     Social History Social History   Tobacco Use  . Smoking status: Current Every Day Smoker    Packs/day: 0.50    Years: 30.00    Pack years: 15.00    Types: Cigarettes  . Smokeless tobacco: Never Used  Substance Use Topics  . Alcohol use: Yes    Comment: occassional, on the weekend  . Drug use: No    No Known Allergies  Current Outpatient Medications  Medication Sig Dispense Refill  . amLODipine  (NORVASC) 5 MG tablet Take 5 mg by mouth daily.    Marland Kitchen amoxicillin (AMOXIL) 500 MG capsule Take 1 capsule (500 mg total) by mouth 3 (three) times daily. 21 capsule 0  . budesonide (RHINOCORT AQUA) 32 MCG/ACT nasal spray Place 2 sprays into both nostrils daily. 8.43 mL 0  . HYDROcodone-acetaminophen (NORCO/VICODIN) 5-325 MG tablet One tablet every four hours as needed for acute pain.  Limit of five days per [place] statue. 30 tablet 0  . methocarbamol (ROBAXIN) 500 MG tablet Take 1 or 2 po Q 6hrs for pain in your neck 60 tablet 0  . Multiple Vitamin (MULTIVITAMIN WITH MINERALS) TABS tablet Take 1 tablet by mouth daily.    . naproxen (NAPROSYN) 500 MG tablet Take 1 tablet (500 mg total) by mouth 2 (two) times daily with a meal. 60 tablet 5  . Vitamin D, Ergocalciferol, (DRISDOL) 50000 units CAPS capsule   0   No current facility-administered medications for this visit.      Physical Exam  Blood pressure 124/90, pulse 94, height 5\' 3"  (1.6 m), weight 147 lb (66.7 kg).  Constitutional: overall normal hygiene, normal nutrition, well developed, normal grooming, normal body habitus. Assistive device:none  Musculoskeletal: gait and station Limp none, muscle tone and strength are normal, no tremors or atrophy is present.  .  Neurological: coordination overall normal.  Deep tendon reflex/nerve stretch intact.  Sensation normal.  Cranial nerves II-XII intact.   Skin:   Normal overall no scars, lesions, ulcers or rashes. No psoriasis.  Psychiatric: Alert and oriented x 3.  Recent memory intact, remote memory unclear.  Normal mood and affect. Well groomed.  Good eye contact.  Cardiovascular: overall no swelling, no varicosities, no edema bilaterally, normal temperatures of the legs and arms, no clubbing, cyanosis and good capillary refill.  Lymphatic: palpation is normal.  Right shoulder has full ROM but tender at the extremes.  NV intact.  Grips normal.  Neck has full ROM.  All other systems  reviewed and are negative   The patient has been educated about the nature of the problem(s) and counseled on treatment options.  The patient appeared to understand what I have discussed and is in agreement with it.  Encounter Diagnoses  Name Primary?  . Chronic right shoulder pain Yes  . Cigarette nicotine dependence without complication     PLAN Call if any problems.  Precautions discussed.  Continue current medications.   Return to clinic 3 weeks   I have reviewed the [place] web site prior to prescribing narcotic medicine for this patient.   Electronically Signed [name], MD 3/10/20202:21 PM

## 2018-11-15 DIAGNOSIS — E663 Overweight: Secondary | ICD-10-CM | POA: Diagnosis not present

## 2018-11-15 DIAGNOSIS — Z6829 Body mass index (BMI) 29.0-29.9, adult: Secondary | ICD-10-CM | POA: Diagnosis not present

## 2018-11-15 DIAGNOSIS — Z0001 Encounter for general adult medical examination with abnormal findings: Secondary | ICD-10-CM | POA: Diagnosis not present

## 2018-11-15 DIAGNOSIS — Z1389 Encounter for screening for other disorder: Secondary | ICD-10-CM | POA: Diagnosis not present

## 2018-11-15 DIAGNOSIS — I10 Essential (primary) hypertension: Secondary | ICD-10-CM | POA: Diagnosis not present

## 2018-11-15 DIAGNOSIS — Z23 Encounter for immunization: Secondary | ICD-10-CM | POA: Diagnosis not present

## 2018-11-26 ENCOUNTER — Telehealth (INDEPENDENT_AMBULATORY_CARE_PROVIDER_SITE_OTHER): Payer: BLUE CROSS/BLUE SHIELD | Admitting: Orthopaedic Surgery

## 2018-11-26 ENCOUNTER — Other Ambulatory Visit: Payer: Self-pay

## 2018-11-26 ENCOUNTER — Encounter: Payer: Self-pay | Admitting: Orthopaedic Surgery

## 2018-11-26 DIAGNOSIS — G8929 Other chronic pain: Secondary | ICD-10-CM | POA: Diagnosis not present

## 2018-11-26 DIAGNOSIS — M25511 Pain in right shoulder: Secondary | ICD-10-CM

## 2018-11-26 DIAGNOSIS — F17210 Nicotine dependence, cigarettes, uncomplicated: Secondary | ICD-10-CM | POA: Diagnosis not present

## 2018-11-26 MED ORDER — HYDROCODONE-ACETAMINOPHEN 5-325 MG PO TABS: ORAL_TABLET | ORAL | 0 refills | Status: DC

## 2018-11-26 NOTE — Progress Notes (Signed)
Virtual Visit via Telephone Note  I connected with [name] on 11/26/18 at  9:30 AM EDT by telephone and verified that I am speaking with the correct person using two identifiers.   I discussed the limitations, risks, security and privacy concerns of performing an evaluation and management service by telephone and the availability of in person appointments. I also discussed with the patient that there may be a patient responsible charge related to this service. The patient expressed understanding and agreed to proceed.   History of Present Illness: She has chronic pain of the right shoulder.  She is a little better.  She has pain with overhead use. She has no swelling but has some numbness at times. She has no redness. She is taking her medicine. The warmer weather has helped.  I will keep her out of work.   Observations/Objective: She says she is moving it better but still has pain.  She has no new trauma.  Assessment and Plan: Encounter Diagnoses  Name Primary?  . Chronic right shoulder pain Yes  . Cigarette nicotine dependence without complication      Follow Up Instructions: Stay out of work.  I will have virtual visit in six weeks.  I have reviewed the [place] web site prior to prescribing narcotic medicine for this patient.      I discussed the assessment and treatment plan with the patient. The patient was provided an opportunity to ask questions and all were answered. The patient agreed with the plan and demonstrated an understanding of the instructions.   The patient was advised to call back or seek an in-person evaluation if the symptoms worsen or if the condition fails to improve as anticipated.  I provided 8 minutes of non-face-to-face time during this encounter.   [name], MD

## 2018-12-03 ENCOUNTER — Encounter: Payer: Self-pay | Admitting: *Deleted

## 2018-12-26 ENCOUNTER — Other Ambulatory Visit: Payer: Self-pay | Admitting: Radiology

## 2018-12-26 NOTE — Telephone Encounter (Signed)
Patient called, Left message asking for a refill of hydrocodone, and to please call her to advise if this could be refilled.

## 2018-12-31 MED ORDER — HYDROCODONE-ACETAMINOPHEN 5-325 MG PO TABS: ORAL_TABLET | ORAL | 0 refills | Status: DC

## 2019-01-07 ENCOUNTER — Ambulatory Visit: Payer: BLUE CROSS/BLUE SHIELD | Admitting: Orthopaedic Surgery

## 2019-01-07 ENCOUNTER — Other Ambulatory Visit: Payer: Self-pay

## 2019-01-07 ENCOUNTER — Encounter: Payer: Self-pay | Admitting: Orthopaedic Surgery

## 2019-01-07 VITALS — BP 133/90 | HR 80 | Temp 97.9°F | Ht 63.0 in | Wt 147.0 lb

## 2019-01-07 DIAGNOSIS — G8929 Other chronic pain: Secondary | ICD-10-CM | POA: Diagnosis not present

## 2019-01-07 DIAGNOSIS — F17210 Nicotine dependence, cigarettes, uncomplicated: Secondary | ICD-10-CM | POA: Diagnosis not present

## 2019-01-07 DIAGNOSIS — M25511 Pain in right shoulder: Secondary | ICD-10-CM | POA: Diagnosis not present

## 2019-01-07 MED ORDER — HYDROCODONE-ACETAMINOPHEN 5-325 MG PO TABS: ORAL_TABLET | ORAL | 0 refills | Status: DC

## 2019-01-07 NOTE — Progress Notes (Signed)
Patient IP:[id number] [name], female DOB:02/10/1966, 53 y.o. QBH:[id number]  Chief Complaint  Patient presents with  . Shoulder Pain    right     HPI  [name] is a 53 y.o. female who has chronic pain of the right shoulder.  She has good and bad days.  Cold rainy days are the worst for her.  She has no new trauma, no paresthesias.  She is taking her medicine.   Body mass index is 26.04 kg/m.  ROS  Review of Systems  HENT: Negative for congestion.   Respiratory: Negative for cough and shortness of breath.   Cardiovascular: Negative for chest pain and leg swelling.       History of hypertension.  Endocrine: Positive for cold intolerance.  Musculoskeletal: Positive for arthralgias.  Allergic/Immunologic: Positive for environmental allergies.  All other systems reviewed and are negative.   All other systems reviewed and are negative.  The following is a summary of the past history medically, past history surgically, known current medicines, social history and family history.  This information is gathered electronically by the computer from prior information and documentation.  I review this each visit and have found including this information at this point in the chart is beneficial and informative.    Past Medical History:  Diagnosis Date  . Hypertension   . Vaginal Pap smear, abnormal     Past Surgical History:  Procedure Laterality Date  . EYE SURGERY    . TONSILLECTOMY      Family History  Problem Relation Age of Onset  . Hypertension Mother     Social History Social History   Tobacco Use  . Smoking status: Current Every Day Smoker    Packs/day: 0.50    Years: 30.00    Pack years: 15.00    Types: Cigarettes  . Smokeless tobacco: Never Used  Substance Use Topics  . Alcohol use: Yes    Comment: occassional, on the weekend  . Drug use: No    No Known Allergies  Current Outpatient Medications  Medication Sig Dispense Refill  . amLODipine  (NORVASC) 5 MG tablet Take 5 mg by mouth daily.    . budesonide (RHINOCORT AQUA) 32 MCG/ACT nasal spray Place 2 sprays into both nostrils daily. 8.43 mL 0  . HYDROcodone-acetaminophen (NORCO/VICODIN) 5-325 MG tablet One tablet every sixhours as needed for acute pain. 25 tablet 0  . methocarbamol (ROBAXIN) 500 MG tablet Take 1 or 2 po Q 6hrs for pain in your neck 60 tablet 0  . Multiple Vitamin (MULTIVITAMIN WITH MINERALS) TABS tablet Take 1 tablet by mouth daily.    . naproxen (NAPROSYN) 500 MG tablet Take 1 tablet (500 mg total) by mouth 2 (two) times daily with a meal. 60 tablet 5  . Vitamin D, Ergocalciferol, (DRISDOL) 50000 units CAPS capsule   0  . amoxicillin (AMOXIL) 500 MG capsule Take 1 capsule (500 mg total) by mouth 3 (three) times daily. (Patient not taking: Reported on 01/07/2019) 21 capsule 0   No current facility-administered medications for this visit.      Physical Exam  Blood pressure 133/90, pulse 80, temperature 97.9 F (36.6 C), height 5\' 3"  (1.6 m), weight 147 lb (66.7 kg).  Constitutional: overall normal hygiene, normal nutrition, well developed, normal grooming, normal body habitus. Assistive device:none  Musculoskeletal: gait and station Limp none, muscle tone and strength are normal, no tremors or atrophy is present.  .  Neurological: coordination overall normal.  Deep tendon reflex/nerve stretch intact.  Sensation normal.  Cranial nerves II-XII intact.   Skin:   Normal overall no scars, lesions, ulcers or rashes. No psoriasis.  Psychiatric: Alert and oriented x 3.  Recent memory intact, remote memory unclear.  Normal mood and affect. Well groomed.  Good eye contact.  Cardiovascular: overall no swelling, no varicosities, no edema bilaterally, normal temperatures of the legs and arms, no clubbing, cyanosis and good capillary refill.  Lymphatic: palpation is normal.  Right shoulder has full motion but tender int the extremes.  NV intact.  All other systems  reviewed and are negative   The patient has been educated about the nature of the problem(s) and counseled on treatment options.  The patient appeared to understand what I have discussed and is in agreement with it.  Encounter Diagnoses  Name Primary?  . Chronic right shoulder pain Yes  . Cigarette nicotine dependence without complication     PLAN Call if any problems.  Precautions discussed.  Continue current medications.   Return to clinic 6 weeks   .[email or web address] Electronically Signed [name], MD 5/12/202010:24 AM

## 2019-01-07 NOTE — Patient Instructions (Signed)
Steps to Quit Smoking    Smoking tobacco can be bad for your health. It can also affect almost every organ in your body. Smoking puts you and people around you at risk for many serious long-lasting (chronic) diseases. Quitting smoking is hard, but it is one of the best things that you can do for your health. It is never too late to quit.  What are the benefits of quitting smoking?  When you quit smoking, you lower your risk for getting serious diseases and conditions. They can include:  · Lung cancer or lung disease.  · Heart disease.  · Stroke.  · Heart attack.  · Not being able to have children (infertility).  · Weak bones (osteoporosis) and broken bones (fractures).  If you have coughing, wheezing, and shortness of breath, those symptoms may get better when you quit. You may also get sick less often. If you are pregnant, quitting smoking can help to lower your chances of having a baby of low birth weight.  What can I do to help me quit smoking?  Talk with your doctor about what can help you quit smoking. Some things you can do (strategies) include:  · Quitting smoking totally, instead of slowly cutting back how much you smoke over a period of time.  · Going to in-person counseling. You are more likely to quit if you go to many counseling sessions.  · Using resources and support systems, such as:  ? Online chats with a counselor.  ? Phone quitlines.  ? Printed self-help materials.  ? Support groups or group counseling.  ? Text messaging programs.  ? Mobile phone apps or applications.  · Taking medicines. Some of these medicines may have nicotine in them. If you are pregnant or breastfeeding, do not take any medicines to quit smoking unless your doctor says it is okay. Talk with your doctor about counseling or other things that can help you.  Talk with your doctor about using more than one strategy at the same time, such as taking medicines while you are also going to in-person counseling. This can help make  quitting easier.  What things can I do to make it easier to quit?  Quitting smoking might feel very hard at first, but there is a lot that you can do to make it easier. Take these steps:  · Talk to your family and friends. Ask them to support and encourage you.  · Call phone quitlines, reach out to support groups, or work with a counselor.  · Ask people who smoke to not smoke around you.  · Avoid places that make you want (trigger) to smoke, such as:  ? Bars.  ? Parties.  ? Smoke-break areas at work.  · Spend time with people who do not smoke.  · Lower the stress in your life. Stress can make you want to smoke. Try these things to help your stress:  ? Getting regular exercise.  ? Deep-breathing exercises.  ? Yoga.  ? Meditating.  ? Doing a body scan. To do this, close your eyes, focus on one area of your body at a time from head to toe, and notice which parts of your body are tense. Try to relax the muscles in those areas.  · Download or buy apps on your mobile phone or tablet that can help you stick to your quit plan. There are many free apps, such as QuitGuide from the CDC (Centers for Disease Control and Prevention). You can find more   support from smokefree.gov and other websites.  This information is not intended to replace advice given to you by your health care provider. Make sure you discuss any questions you have with your health care provider.  Document Released: 06/10/2009 Document Revised: 04/11/2016 Document Reviewed: 12/29/2014  Elsevier Interactive Patient Education © 2019 [employer]vier Inc.

## 2019-01-23 ENCOUNTER — Other Ambulatory Visit (HOSPITAL_COMMUNITY): Payer: Self-pay | Admitting: Family Medicine

## 2019-01-23 DIAGNOSIS — Z1231 Encounter for screening mammogram for malignant neoplasm of breast: Secondary | ICD-10-CM

## 2019-01-24 ENCOUNTER — Ambulatory Visit (HOSPITAL_COMMUNITY)
Admission: RE | Admit: 2019-01-24 | Discharge: 2019-01-24 | Disposition: A | Discharge status: Home / Self Care | Payer: BLUE CROSS/BLUE SHIELD | Source: Ambulatory Visit | Attending: Family Medicine | Admitting: Family Medicine

## 2019-01-24 ENCOUNTER — Other Ambulatory Visit: Payer: Self-pay

## 2019-01-24 DIAGNOSIS — Z1231 Encounter for screening mammogram for malignant neoplasm of breast: Secondary | ICD-10-CM

## 2019-02-17 ENCOUNTER — Ambulatory Visit: Payer: BLUE CROSS/BLUE SHIELD

## 2019-02-18 ENCOUNTER — Ambulatory Visit: Payer: BLUE CROSS/BLUE SHIELD | Admitting: Orthopaedic Surgery

## 2019-02-19 ENCOUNTER — Other Ambulatory Visit: Payer: Self-pay

## 2019-02-19 ENCOUNTER — Ambulatory Visit: Payer: BC Managed Care – PPO | Admitting: Orthopaedic Surgery

## 2019-03-05 ENCOUNTER — Ambulatory Visit (INDEPENDENT_AMBULATORY_CARE_PROVIDER_SITE_OTHER): Payer: BC Managed Care – PPO | Admitting: Orthopaedic Surgery

## 2019-03-05 ENCOUNTER — Encounter: Payer: Self-pay | Admitting: Orthopaedic Surgery

## 2019-03-05 ENCOUNTER — Other Ambulatory Visit: Payer: Self-pay

## 2019-03-05 VITALS — BP 121/80 | HR 87 | Ht 63.0 in | Wt 145.0 lb

## 2019-03-05 DIAGNOSIS — G8929 Other chronic pain: Secondary | ICD-10-CM

## 2019-03-05 DIAGNOSIS — M25511 Pain in right shoulder: Secondary | ICD-10-CM | POA: Diagnosis not present

## 2019-03-05 DIAGNOSIS — F17210 Nicotine dependence, cigarettes, uncomplicated: Secondary | ICD-10-CM | POA: Diagnosis not present

## 2019-03-05 MED ORDER — HYDROCODONE-ACETAMINOPHEN 5-325 MG PO TABS: ORAL_TABLET | ORAL | 0 refills | Status: DC

## 2019-03-05 NOTE — Progress Notes (Signed)
Patient [name], female DOB:01/22/1966, 53 y.o. TDV:[id number]  Chief Complaint  Patient presents with  . Shoulder Pain    R/ feels better    HPI  [name] is a 53 y.o. female who has chronic right shoulder pain.  She is a little better.  She still has pain when doing a lot of overhead use.  She has no trauma, no swelling, no redness.   Body mass index is 25.69 kg/m.  ROS  Review of Systems  HENT: Negative for congestion.   Respiratory: Negative for cough and shortness of breath.   Cardiovascular: Negative for chest pain and leg swelling.       History of hypertension.  Endocrine: Positive for cold intolerance.  Musculoskeletal: Positive for arthralgias.  Allergic/Immunologic: Positive for environmental allergies.  All other systems reviewed and are negative.   All other systems reviewed and are negative.  The following is a summary of the past history medically, past history surgically, known current medicines, social history and family history.  This information is gathered electronically by the computer from prior information and documentation.  I review this each visit and have found including this information at this point in the chart is beneficial and informative.    Past Medical History:  Diagnosis Date  . Hypertension   . Vaginal Pap smear, abnormal     Past Surgical History:  Procedure Laterality Date  . EYE SURGERY    . TONSILLECTOMY      Family History  Problem Relation Age of Onset  . Hypertension Mother     Social History Social History   Tobacco Use  . Smoking status: Current Every Day Smoker    Packs/day: 0.50    Years: 30.00    Pack years: 15.00    Types: Cigarettes  . Smokeless tobacco: Never Used  Substance Use Topics  . Alcohol use: Yes    Comment: occassional, on the weekend  . Drug use: No    No Known Allergies  Current Outpatient Medications  Medication Sig Dispense Refill  . amLODipine (NORVASC) 5  MG tablet Take 5 mg by mouth daily.    Marland Kitchen amoxicillin (AMOXIL) 500 MG capsule Take 1 capsule (500 mg total) by mouth 3 (three) times daily. (Patient not taking: Reported on 01/07/2019) 21 capsule 0  . budesonide (RHINOCORT AQUA) 32 MCG/ACT nasal spray Place 2 sprays into both nostrils daily. 8.43 mL 0  . HYDROcodone-acetaminophen (NORCO/VICODIN) 5-325 MG tablet One tablet every sixhours as needed for acute pain. 25 tablet 0  . methocarbamol (ROBAXIN) 500 MG tablet Take 1 or 2 po Q 6hrs for pain in your neck 60 tablet 0  . Multiple Vitamin (MULTIVITAMIN WITH MINERALS) TABS tablet Take 1 tablet by mouth daily.    . naproxen (NAPROSYN) 500 MG tablet Take 1 tablet (500 mg total) by mouth 2 (two) times daily with a meal. 60 tablet 5  . Vitamin D, Ergocalciferol, (DRISDOL) 50000 units CAPS capsule   0   No current facility-administered medications for this visit.      Physical Exam  Blood pressure 121/80, pulse 87, height 5\' 3"  (1.6 m), weight 145 lb (65.8 kg).  Constitutional: overall normal hygiene, normal nutrition, well developed, normal grooming, normal body habitus. Assistive device:none  Musculoskeletal: gait and station Limp none, muscle tone and strength are normal, no tremors or atrophy is present.  .  Neurological: coordination overall normal.  Deep tendon reflex/nerve stretch intact.  Sensation normal.  Cranial nerves II-XII intact.  Skin:   Normal overall no scars, lesions, ulcers or rashes. No psoriasis.  Psychiatric: Alert and oriented x 3.  Recent memory intact, remote memory unclear.  Normal mood and affect. Well groomed.  Good eye contact.  Cardiovascular: overall no swelling, no varicosities, no edema bilaterally, normal temperatures of the legs and arms, no clubbing, cyanosis and good capillary refill.  Right shoulder has full ROM and some pain in the extremes.  NV intact.  Lymphatic: palpation is normal.  All other systems reviewed and are negative   The patient has  been educated about the nature of the problem(s) and counseled on treatment options.  The patient appeared to understand what I have discussed and is in agreement with it.  Encounter Diagnoses  Name Primary?  . Chronic right shoulder pain Yes  . Cigarette nicotine dependence without complication     PLAN Call if any problems.  Precautions discussed.  Continue current medications.   Return to clinic as needed  I have reviewed the [place] web site prior to prescribing narcotic medicine for this patient.     Electronically Signed [name], MD 7/8/20209:58 AM

## 2019-03-05 NOTE — Patient Instructions (Signed)

## 2019-03-31 ENCOUNTER — Telehealth: Payer: Self-pay

## 2019-03-31 NOTE — Telephone Encounter (Signed)
Pt called to confirm her appointment in Sept and I told her it was 9/22 but that she would be called since it is a triage.  She said she is having some crazy stomach problems and she wants an appointment this week.  Pt has never been seen here and was referred by [facility] for the colonoscopy.  Per Erline Levine she will need new referral for the new problem.  Pt is aware.

## 2019-03-31 NOTE — Telephone Encounter (Signed)
Reviewed

## 2019-05-20 ENCOUNTER — Ambulatory Visit: Payer: BLUE CROSS/BLUE SHIELD

## 2019-05-20 ENCOUNTER — Other Ambulatory Visit: Payer: Self-pay

## 2019-05-20 ENCOUNTER — Ambulatory Visit (INDEPENDENT_AMBULATORY_CARE_PROVIDER_SITE_OTHER): Payer: Self-pay | Admitting: *Deleted

## 2019-05-20 DIAGNOSIS — Z1211 Encounter for screening for malignant neoplasm of colon: Secondary | ICD-10-CM

## 2019-05-20 MED ORDER — PEG 3350-KCL-NA BICARB-NACL 420 G PO SOLR: 4000.0000 mL | Freq: Once | ORAL | 0 refills | Status: AC

## 2019-05-20 NOTE — Patient Instructions (Signed)
[name]   12-14-1965 MRN: [id number]    Procedure Date: 08/08/2019 Time to register: 12:00 pm Place to register: [facility] Procedure Time: 1:00 pm Scheduled provider: Dr. Oneida Alar  PREPARATION FOR COLONOSCOPY WITH TRI-LYTE SPLIT PREP  Please notify us immediately if you are diabetic, take iron supplements, or if you are on Coumadin or any other blood thinners.   You will need to purchase 1 fleet enema and 1 box of Bisacodyl 81m tablets.   1 DAY BEFORE PROCEDURE:  DATE: 08/07/2019   DAY: Thursday Continue clear liquids the entire day - NO SOLID FOOD.   At 2:00 pm:  Take 2 Bisacodyl tablets.   At 4:00pm:  Start drinking your solution. Make sure you mix well per instructions on the bottle. Try to drink 1 (one) 8 ounce glass every 10-15 minutes until you have consumed HALF the jug. You should complete by 6:00pm.You must keep the left over solution refrigerated until completed next day.  Continue clear liquids. You must drink plenty of clear liquids to prevent dehyration and kidney failure.     DAY OF PROCEDURE:   DATE: 08/08/2019  DAY: Friday If you take medications for your heart, blood pressure or breathing, you may take these medications.   Five hours before your procedure time @ 8:00 am:  Finish remaining amout of bowel prep, drinking 1 (one) 8 ounce glass every 10-15 minutes until complete. You have two hours to consume remaining prep.   Three hours before your procedure time @ 10:00 am:  Nothing by mouth.   At least one hour before going to the hospital:  Give yourself one Fleet enema. You may take your morning medications with sip of water unless we have instructed otherwise.      Please see below for Dietary Information.  CLEAR LIQUIDS INCLUDE:  Water Jello (NOT red in color)   Ice Popsicles (NOT red in color)   Tea (sugar ok, no milk/cream) Powdered fruit flavored drinks  Coffee (sugar ok, no milk/cream) Gatorade/ Lemonade/ Kool-Aid  (NOT red  in color)   Juice: apple, white grape, white cranberry Soft drinks  Clear bullion, consomme, broth (fat free beef/chicken/vegetable)  Carbonated beverages (any kind)  Strained chicken noodle soup Hard Candy   Remember: Clear liquids are liquids that will allow you to see your fingers on the other side of a clear glass. Be sure liquids are NOT red in color, and not cloudy, but CLEAR.  DO NOT EAT OR DRINK ANY OF THE FOLLOWING:  Dairy products of any kind   Cranberry juice Tomato juice / V8 juice   Grapefruit juice Orange juice     Red grape juice  Do not eat any solid foods, including such foods as: cereal, oatmeal, yogurt, fruits, vegetables, creamed soups, eggs, bread, crackers, pureed foods in a blender, etc.   HELPFUL HINTS FOR DRINKING PREP SOLUTION:   Make sure prep is extremely cold. Mix and refrigerate the the morning of the prep. You may also put in the freezer.   You may try mixing some Crystal Light or Country Time Lemonade if you prefer. Mix in small amounts; add more if necessary.  Try drinking through a straw  Rinse mouth with water or a mouthwash between glasses, to remove after-taste.  Try sipping on a cold beverage /ice/ popsicles between glasses of prep.  Place a piece of sugar-free hard candy in mouth between glasses.  If you become nauseated, try consuming smaller amounts, or stretch out the time  between glasses. Stop for 30-60 minutes, then slowly start back drinking.        OTHER INSTRUCTIONS  You will need a responsible adult at least 51 years of age to accompany you and drive you home. This person must remain in the waiting room during your procedure. The hospital will cancel your procedure if you do not have a responsible adult with you.   1. Wear loose fitting clothing that is easily removed. 2. Leave jewelry and other valuables at home.  3. Remove all body piercing jewelry and leave at home. 4. Total time from sign-in until discharge is  approximately 2-3 hours. 5. You should go home directly after your procedure and rest. You can resume normal activities the day after your procedure. 6. The day of your procedure you should not:  Drive  Make legal decisions  Operate machinery  Drink alcohol  Return to work   You may call the office (Dept: [phone]) before 5:00pm, or page the doctor on call [phone]) after 5:00pm, for further instructions, if necessary.   Insurance Information YOU WILL NEED TO CHECK WITH YOUR INSURANCE COMPANY FOR THE BENEFITS OF COVERAGE YOU HAVE FOR THIS PROCEDURE.  UNFORTUNATELY, NOT ALL INSURANCE COMPANIES HAVE BENEFITS TO COVER ALL OR PART OF THESE TYPES OF PROCEDURES.  IT IS YOUR RESPONSIBILITY TO CHECK YOUR BENEFITS, HOWEVER, WE WILL BE GLAD TO ASSIST YOU WITH ANY CODES YOUR INSURANCE COMPANY MAY NEED.    PLEASE NOTE THAT MOST INSURANCE COMPANIES WILL NOT COVER A SCREENING COLONOSCOPY FOR PEOPLE UNDER THE AGE OF 50  IF YOU HAVE BCBS INSURANCE, YOU MAY HAVE BENEFITS FOR A SCREENING COLONOSCOPY BUT IF POLYPS ARE FOUND THE DIAGNOSIS WILL CHANGE AND THEN YOU MAY HAVE A DEDUCTIBLE THAT WILL NEED TO BE MET. SO PLEASE MAKE SURE YOU CHECK YOUR BENEFITS FOR A SCREENING COLONOSCOPY AS WELL AS A DIAGNOSTIC COLONOSCOPY.

## 2019-05-20 NOTE — Progress Notes (Signed)
Gastroenterology Pre-Procedure Review  Request Date: 05/20/2019 Requesting Physician: Delman Cheadle, PA-C @ [facility], no previous TCS  PATIENT REVIEW QUESTIONS: The patient responded to the following health history questions as indicated:    1. Diabetes Melitis: No 2. Joint replacements in the past 12 months: No 3. Major health problems in the past 3 months: No 4. Has an artificial valve or MVP: No 5. Has a defibrillator: No 6. Has been advised in past to take antibiotics in advance of a procedure like teeth cleaning: No 7. Family history of colon cancer: Yes, grandfather age 63  8. Alcohol Use: Yes, 4 to 5 drinks (liquor) on weekend 9. Illicit drug Use: No 10. History of sleep apnea: No  11. History of coronary artery or other vascular stents placed within the last 12 months: No 12. History of any prior anesthesia complications: No 13. There is no height or weight on file to calculate BMI. Ht: 5'3 Wt: 143 lbs    MEDICATIONS & ALLERGIES:    Patient reports the following regarding taking any blood thinners:   Plavix? No Aspirin? No Coumadin? No Brilinta? No Xarelto? No Eliquis? No Pradaxa? No Savaysa? No Effient? No  Patient confirms/reports the following medications:  Current Outpatient Medications  Medication Sig Dispense Refill  . amLODipine (NORVASC) 5 MG tablet Take 5 mg by mouth daily.    . budesonide (RHINOCORT AQUA) 32 MCG/ACT nasal spray Place 2 sprays into both nostrils daily. (Patient taking differently: Place 2 sprays into both nostrils as needed. ) 8.43 mL 0  . Cholecalciferol (VITAMIN D3 SUPER STRENGTH) 50 MCG (2000 UT) TABS Take by mouth daily.    Marland Kitchen HYDROcodone-acetaminophen (NORCO/VICODIN) 5-325 MG tablet One tablet every sixhours as needed for acute pain. (Patient taking differently: as needed. One tablet every sixhours as needed for acute pain.) 25 tablet 0  . Multiple Vitamin (MULTIVITAMIN WITH MINERALS) TABS tablet Take 1 tablet by mouth daily.    .  naproxen (NAPROSYN) 500 MG tablet Take 1 tablet (500 mg total) by mouth 2 (two) times daily with a meal. (Patient taking differently: Take 500 mg by mouth as needed. ) 60 tablet 5  . methocarbamol (ROBAXIN) 500 MG tablet Take 1 or 2 po Q 6hrs for pain in your neck (Patient not taking: Reported on 05/20/2019) 60 tablet 0   No current facility-administered medications for this visit.     Patient confirms/reports the following allergies:  No Known Allergies  No orders of the defined types were placed in this encounter.   AUTHORIZATION INFORMATION Primary Insurance: [employer] [place]  ,  [place] #:[id number],  Group #: [id number] Pre-Cert / Auth required: No, file to local BCBS  SCHEDULE INFORMATION: Procedure has been scheduled as follows:  Date: 08/08/2019, Time: 1:00 Location: [facility] with Dr. Oneida Alar  This Gastroenterology Pre-Precedure Review Form is being routed to the following provider(s): Walden Field, NP

## 2019-05-21 ENCOUNTER — Encounter: Payer: Self-pay | Admitting: *Deleted

## 2019-05-21 NOTE — Progress Notes (Signed)
Mailed letter to pt with appointment information and procedure cancellation.   

## 2019-05-21 NOTE — Progress Notes (Signed)
Will need OV due to meds (Norco) and ETOH

## 2019-06-19 ENCOUNTER — Encounter: Payer: Self-pay | Admitting: *Deleted

## 2019-06-19 ENCOUNTER — Other Ambulatory Visit: Payer: Self-pay

## 2019-06-19 ENCOUNTER — Ambulatory Visit (INDEPENDENT_AMBULATORY_CARE_PROVIDER_SITE_OTHER): Payer: BC Managed Care – PPO | Admitting: Nurse Practitioner

## 2019-06-19 ENCOUNTER — Encounter: Payer: Self-pay | Admitting: Nurse Practitioner

## 2019-06-19 ENCOUNTER — Other Ambulatory Visit: Payer: Self-pay | Admitting: *Deleted

## 2019-06-19 DIAGNOSIS — Z0000 Encounter for general adult medical examination without abnormal findings: Secondary | ICD-10-CM

## 2019-06-19 DIAGNOSIS — Z1211 Encounter for screening for malignant neoplasm of colon: Secondary | ICD-10-CM

## 2019-06-19 DIAGNOSIS — R1084 Generalized abdominal pain: Secondary | ICD-10-CM

## 2019-06-19 DIAGNOSIS — R109 Unspecified abdominal pain: Secondary | ICD-10-CM | POA: Insufficient documentation

## 2019-06-19 MED ORDER — PEG 3350-KCL-NA BICARB-NACL 420 G PO SOLR: 4000.0000 mL | Freq: Once | ORAL | 0 refills | Status: AC

## 2019-06-19 NOTE — Assessment & Plan Note (Signed)
The patient is due for first-ever colonoscopy.  This is intended to be a screening colonoscopy, but she does describe abdominal pain below.  At this point we will proceed with colonoscopy as previously recommended.  Further recommendations to follow her colonoscopy.  Proceed with TCS on propofol/MAC with Dr. Gala Romney in near future: the risks, benefits, and alternatives have been discussed with the patient in detail. The patient states understanding and desires to proceed.  The patient is currently on hydrocodone.  No other anticoagulants, anxiolytics, chronic pain medications, antidepressants, antidiabetics, or iron supplements.  Occasional alcohol, denies recreational drugs.  We will plan for the procedure on propofol/MAC to promote adequate sedation.

## 2019-06-19 NOTE — Assessment & Plan Note (Signed)
Patient describes vague generalized abdominal pain which occurs about every 2 to 3 weeks and last for typically 1 to 2 days.  Occasionally has diarrhea which is not be associated with her pain.  Denies constipation, typically has Bristol 4 stools 1-2 times a day.  No other symptoms, no red flag/warning signs or symptoms such as unintentional weight loss or hematochezia/melena.  Her symptoms do generally improve with a bowel movement.  Query underlying mild constipation with associated bloating and abdominal discomfort.  At this point we will check a two-view abdominal x-ray for stool burden.  She is due for first-ever colonoscopy and we will schedule this which may help evaluate and or improve her symptoms with her bowel prep.  Recommend she call for any worsening symptoms and follow-up in 4 months.

## 2019-06-19 NOTE — Patient Instructions (Signed)
Your health issues we discussed today were:   Need for first-ever colonoscopy: 1. We will schedule your colonoscopy for you 2. Further recommendations will be made after your colonoscopy  Intermittent abdominal discomfort: 1. Have your x-ray completed when you are able to 2. We will call you when we get the results 3. Call us if you have any worsening or severe symptoms 4. Otherwise we can discuss your symptoms further at your follow-up office visit.  Overall I recommend:  1. Continue your other current medications 2. Return for follow-up in 3 to 4 months 3. Call us if you have any questions or concerns.   Because of recent events of COVID-19 ("Coronavirus"), follow CDC recommendations:  1. Wash your hand frequently 2. Avoid touching your face 3. Stay away from people who are sick 4. If you have symptoms such as fever, cough, shortness of breath then call your healthcare provider for further guidance 5. If you are sick, STAY AT HOME unless otherwise directed by your healthcare provider. 6. Follow directions from state and national officials regarding staying safe   At [facility] we value your feedback. You may receive a survey about your visit today. Please share your experience as we strive to create trusting relationships with our patients to provide genuine, compassionate, quality care.  We appreciate your understanding and patience as we review any laboratory studies, imaging, and other diagnostic tests that are ordered as we care for you. Our office policy is 5 business days for review of these results, and any emergent or urgent results are addressed in a timely manner for your best interest. If you do not hear from our office in 1 week, please contact us.   We also encourage the use of MyChart, which contains your medical information for your review as well. If you are not enrolled in this feature, an access code is on this after visit summary for your  convenience. Thank you for allowing [place] to be involved in your care.  It was great to see you today!  I hope you have a great Fall!!

## 2019-06-19 NOTE — Progress Notes (Signed)
Primary Care Physician:  Scherrie Bateman Primary Gastroenterologist:  Dr. Gala Romney  Chief Complaint  Patient presents with  . Colonoscopy    never had tcs; feel like knots in stomach    HPI:   [name] is a 53 y.o. female who presents to schedule a colonoscopy.  The patient was referred by primary care at her last well woman exam for first-ever colonoscopy.  Nurse/phone triage was deferred to office visit due to medications and degree of alcohol consumption likely necessitating augmented sedation.  No history of colonoscopy in our system.  Today she states she's doing ok overall. She has never had a colonoscopy before. Has had intermittent abdominal pain last occurred 3 weeks ago, lasts intermittently for a couple days and then stop for multiple weeks. Pain is mid-abdomen, feels like "a ball" and self-resolves. No association with eating. Improves with a bowel movement. Has a bowel movement daily to twice daily, stools consistent with Bristol 4. Denies hematochezia, melena. Occasionally has diarrhea, depending on what she eats; no associated with diarrhea and her pain. Has rare reflux, Denies fever, chills, unintentional weight loss. Denies URI or flu-like symptoms. Denies loss of sense of taste or smell. Denies chest pain, dyspnea, dizziness, lightheadedness, syncope, near syncope. Denies any other upper or lower GI symptoms.  Past Medical History:  Diagnosis Date  . Hypertension   . Vaginal Pap smear, abnormal     Past Surgical History:  Procedure Laterality Date  . EYE SURGERY    . TONSILLECTOMY      Current Outpatient Medications  Medication Sig Dispense Refill  . amLODipine (NORVASC) 5 MG tablet Take 5 mg by mouth daily.    . Cholecalciferol (VITAMIN D3 SUPER STRENGTH) 50 MCG (2000 UT) TABS Take by mouth daily.    Marland Kitchen HYDROcodone-acetaminophen (NORCO/VICODIN) 5-325 MG tablet One tablet every sixhours as needed for acute pain. (Patient taking differently: as  needed. One tablet every sixhours as needed for acute pain.) 25 tablet 0  . Multiple Vitamin (MULTIVITAMIN WITH MINERALS) TABS tablet Take 1 tablet by mouth daily.     No current facility-administered medications for this visit.     Allergies as of 06/19/2019  . (No Known Allergies)    Family History  Problem Relation Age of Onset  . Hypertension Mother   . Colon cancer Neg Hx     Social History   Socioeconomic History  . Marital status: Married    Spouse name: Not on file  . Number of children: Not on file  . Years of education: Not on file  . Highest education level: Not on file  Occupational History  . Not on file  Social Needs  . Financial resource strain: Not on file  . Food insecurity    Worry: Not on file    Inability: Not on file  . Transportation needs    Medical: Not on file    Non-medical: Not on file  Tobacco Use  . Smoking status: Current Every Day Smoker    Packs/day: 0.50    Years: 30.00    Pack years: 15.00    Types: Cigarettes  . Smokeless tobacco: Never Used  Substance and Sexual Activity  . Alcohol use: Yes    Comment: occassional  . Drug use: No  . Sexual activity: Yes    Birth control/protection: None  Lifestyle  . Physical activity    Days per week: Not on file    Minutes per session: Not on file  .  Stress: Not on file  Relationships  . Social Herbalist on phone: Not on file    Gets together: Not on file    Attends religious service: Not on file    Active member of club or organization: Not on file    Attends meetings of clubs or organizations: Not on file    Relationship status: Not on file  . Intimate partner violence    Fear of current or ex partner: Not on file    Emotionally abused: Not on file    Physically abused: Not on file    Forced sexual activity: Not on file  Other Topics Concern  . Not on file  Social History Narrative  . Not on file    Review of Systems: General: Negative for anorexia, weight loss,  fever, chills, fatigue, weakness. ENT: Negative for hoarseness, difficulty swallowing. CV: Negative for chest pain, angina, palpitations, peripheral edema.  Respiratory: Negative for dyspnea at rest, cough, sputum, wheezing.  GI: See history of present illness. MS: Negative for joint pain, low back pain.  Derm: Negative for rash or itching.  Endo: Negative for unusual weight change.  Heme: Negative for bruising or bleeding. Allergy: Negative for rash or hives.    Physical Exam: BP 139/90   Pulse 76   Temp (!) 96.6 F (35.9 C) (Temporal)   Ht 5\' 3"  (1.6 m)   Wt 141 lb 9.6 oz (64.2 kg)   BMI 25.08 kg/m  General:   Alert and oriented. Pleasant and cooperative. Well-nourished and well-developed.  Head:  Normocephalic and atraumatic. Eyes:  Without icterus, sclera clear and conjunctiva pink.  Ears:  Normal auditory acuity. Cardiovascular:  S1, S2 present without murmurs appreciated. Extremities without clubbing or edema. Respiratory:  Clear to auscultation bilaterally. No wheezes, rales, or rhonchi. No distress.  Gastrointestinal:  +BS, soft, non-tender and non-distended. No HSM noted. No guarding or rebound. No masses appreciated.  Rectal:  Deferred  Musculoskalatal:  Symmetrical without gross deformities. Neurologic:  Alert and oriented x4;  grossly normal neurologically. Psych:  Alert and cooperative. Normal mood and affect. Heme/Lymph/Immune: No excessive bruising noted.    06/19/2019 11:41 AM   Disclaimer: This note was dictated with voice recognition software. Similar sounding words can inadvertently be transcribed and may not be corrected upon review.

## 2019-07-29 ENCOUNTER — Telehealth: Payer: Self-pay | Admitting: Obstetrics and Gynecology

## 2019-07-29 NOTE — Telephone Encounter (Signed)

## 2019-07-30 ENCOUNTER — Encounter: Payer: Self-pay | Admitting: Obstetrics and Gynecology

## 2019-07-30 ENCOUNTER — Other Ambulatory Visit: Payer: Self-pay

## 2019-07-30 ENCOUNTER — Other Ambulatory Visit (HOSPITAL_COMMUNITY)
Admission: RE | Admit: 2019-07-30 | Discharge: 2019-07-30 | Disposition: A | Discharge status: Home / Self Care | Payer: BC Managed Care – PPO | Source: Ambulatory Visit | Attending: Obstetrics and Gynecology | Admitting: Obstetrics and Gynecology

## 2019-07-30 ENCOUNTER — Other Ambulatory Visit: Payer: BC Managed Care – PPO | Admitting: Obstetrics and Gynecology

## 2019-07-30 ENCOUNTER — Ambulatory Visit (INDEPENDENT_AMBULATORY_CARE_PROVIDER_SITE_OTHER): Payer: BC Managed Care – PPO | Admitting: Obstetrics and Gynecology

## 2019-07-30 VITALS — BP 113/75 | HR 95 | Ht 63.0 in | Wt 138.4 lb

## 2019-07-30 DIAGNOSIS — Z1272 Encounter for screening for malignant neoplasm of vagina: Secondary | ICD-10-CM | POA: Diagnosis not present

## 2019-07-30 DIAGNOSIS — Z01419 Encounter for gynecological examination (general) (routine) without abnormal findings: Secondary | ICD-10-CM | POA: Insufficient documentation

## 2019-07-30 DIAGNOSIS — Z1211 Encounter for screening for malignant neoplasm of colon: Secondary | ICD-10-CM

## 2019-07-30 LAB — HEMOCCULT GUIAC POC 1CARD (OFFICE): Fecal Occult Blood, POC: NEGATIVE

## 2019-07-30 NOTE — Progress Notes (Signed)
Patient ID: [name], female   DOB: Feb 23, 1966, 53 y.o.   MRN: [id number]   Assessment:  Annual Gyn Exam Plan:  1. Pap smear done, will continue with yearly paps per pt preference 2. Return annually or prn 3    Annual mammogram advised after age 89 Subjective:  [name] is a 53 y.o. female [id number] who presents for annual exam. Patient's last menstrual period was 01/23/2018. The patient has no complaints today. Her periods stopped about a year ago and she has been having hot flashes, but they do not interfere with her QOL. She has a Hx of CIN I in 2017 but her pap last 2018 was normal.  The following portions of the patient's history were reviewed and updated as appropriate: allergies, current medications, past family history, past medical history, past social history, past surgical history and problem list. Past Medical History:  Diagnosis Date  . Hypertension   . Vaginal Pap smear, abnormal     Past Surgical History:  Procedure Laterality Date  . EYE SURGERY    . TONSILLECTOMY       Current Outpatient Medications:  .  amLODipine (NORVASC) 5 MG tablet, Take 5 mg by mouth daily., Disp: , Rfl:  .  Cholecalciferol (VITAMIN D3 SUPER STRENGTH) 50 MCG (2000 UT) TABS, Take by mouth daily., Disp: , Rfl:  .  HYDROcodone-acetaminophen (NORCO/VICODIN) 5-325 MG tablet, One tablet every sixhours as needed for acute pain. (Patient taking differently: as needed. One tablet every sixhours as needed for acute pain.), Disp: 25 tablet, Rfl: 0 .  Multiple Vitamin (MULTIVITAMIN WITH MINERALS) TABS tablet, Take 1 tablet by mouth daily., Disp: , Rfl:   Review of Systems Constitutional: negative Gastrointestinal: negative Genitourinary: negative  Objective:  BP 113/75 (BP Location: Right Arm, Patient Position: Sitting, Cuff Size: Normal)   Pulse 95   Ht 5\' 3"  (1.6 m)   Wt 138 lb 6.4 oz (62.8 kg)   LMP 01/23/2018   BMI 24.52 kg/m    BMI: Body mass index is 24.52 kg/m.   General Appearance: Alert, appropriate appearance for age. No acute distress HEENT: Grossly normal Neck / Thyroid:  Cardiovascular: RRR; normal S1, S2, no murmur Lungs: CTA bilaterally Back: No CVAT Breast Exam: No dimpling, nipple retraction or discharge. No masses or nodes., Normal to inspection, Normal breast tissue bilaterally and No masses or nodes.No dimpling, nipple retraction or discharge. Gastrointestinal: Soft, non-tender, no masses or organomegaly Pelvic Exam: Vulva and vagina appear normal. Bimanual exam reveals normal uterus and adnexa. Tiny uterus and cervix. Rectovaginal: hemoccult negative Lymphatic Exam: Non-palpable nodes in neck, clavicular, axillary, or inguinal regions  Skin: no rash or abnormalities Neurologic: Normal gait and speech, no tremor  Psychiatric: Alert and oriented, appropriate affect.  Urinalysis:Not done  By signing my name below, I, De Burrs, attest that this documentation has been prepared under the direction and in the presence of Jonnie Kind, MD. Electronically Signed: De Burrs, Medical Scribe. 07/30/19. 2:29 PM.  I personally performed the services described in this documentation, which was SCRIBED in my presence. The recorded information has been reviewed and considered accurate. It has been edited as necessary during review. Jonnie Kind, MD

## 2019-07-30 NOTE — Addendum Note (Signed)
Addended by: Armond Hang on: 07/30/2019 02:58 PM   Modules accepted: Orders

## 2019-08-03 LAB — CYTOLOGY - PAP
Adequacy: ABSENT
Chlamydia: NEGATIVE
Comment: NEGATIVE
Comment: NEGATIVE
Comment: NORMAL
Diagnosis: NEGATIVE
High risk HPV: NEGATIVE
Neisseria Gonorrhea: NEGATIVE

## 2019-08-04 ENCOUNTER — Telehealth: Payer: Self-pay | Admitting: *Deleted

## 2019-08-04 ENCOUNTER — Telehealth: Payer: Self-pay | Admitting: Internal Medicine

## 2019-08-04 NOTE — Telephone Encounter (Signed)
Patient notified of normal pap-negative for HPV, GC and chlamydia. Pt verbalized understanding with no further questions.

## 2019-08-04 NOTE — Telephone Encounter (Signed)
[phone]  PLEASE CALL PATIENT TOMORROW MORNING, TO RESCHEDULE HER TCS.  STATES HER EMPLOYER WILL NOT LET HER OFF AND SHE CAN DO IT ANYTIME IN January

## 2019-08-05 NOTE — Telephone Encounter (Signed)
Called pt, TCS w/Propofol w/RMR rescheduled to 10/16/19 at 2:45pm. Endo scheduler informed. Pre-op and COVID test 10/15/19. Appt letter mailed with procedure instructions.

## 2019-08-06 ENCOUNTER — Other Ambulatory Visit (HOSPITAL_COMMUNITY): Payer: BC Managed Care – PPO

## 2019-08-08 ENCOUNTER — Other Ambulatory Visit (HOSPITAL_COMMUNITY): Payer: BC Managed Care – PPO

## 2019-08-08 ENCOUNTER — Encounter (HOSPITAL_COMMUNITY): Payer: BC Managed Care – PPO

## 2019-08-08 IMAGING — MG DIGITAL SCREENING BILATERAL MAMMOGRAM WITH TOMO AND CAD
8 series · 8 of 24 positions shown · non-contrast
Comparison: Previous exam(s).

CLINICAL DATA: Screening.

EXAM:
DIGITAL SCREENING BILATERAL MAMMOGRAM WITH TOMO AND CAD

[L CC synth-2D]
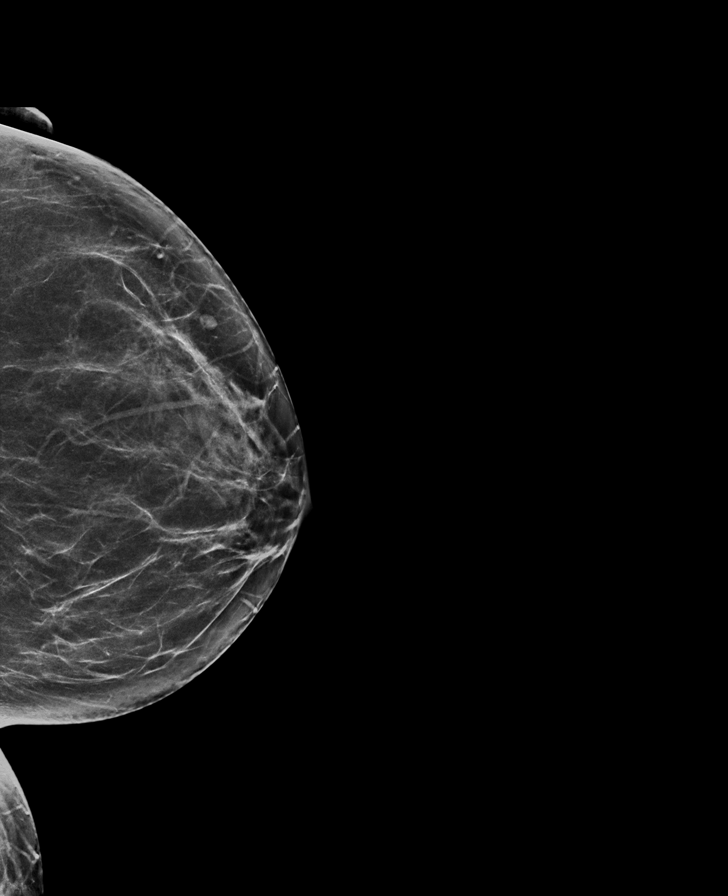

[R CC synth-2D]
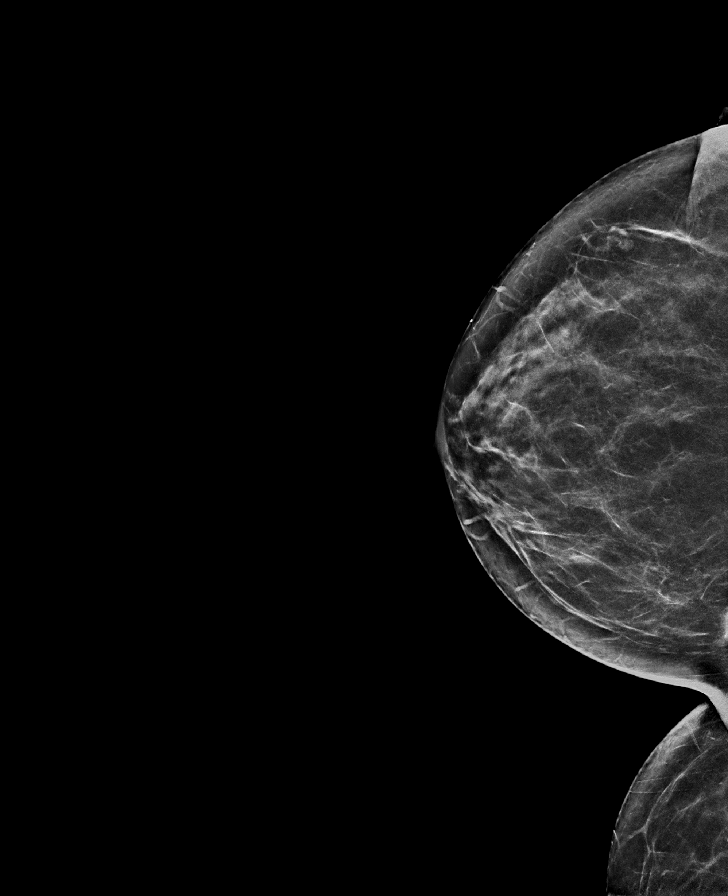

[R MLO synth-2D]
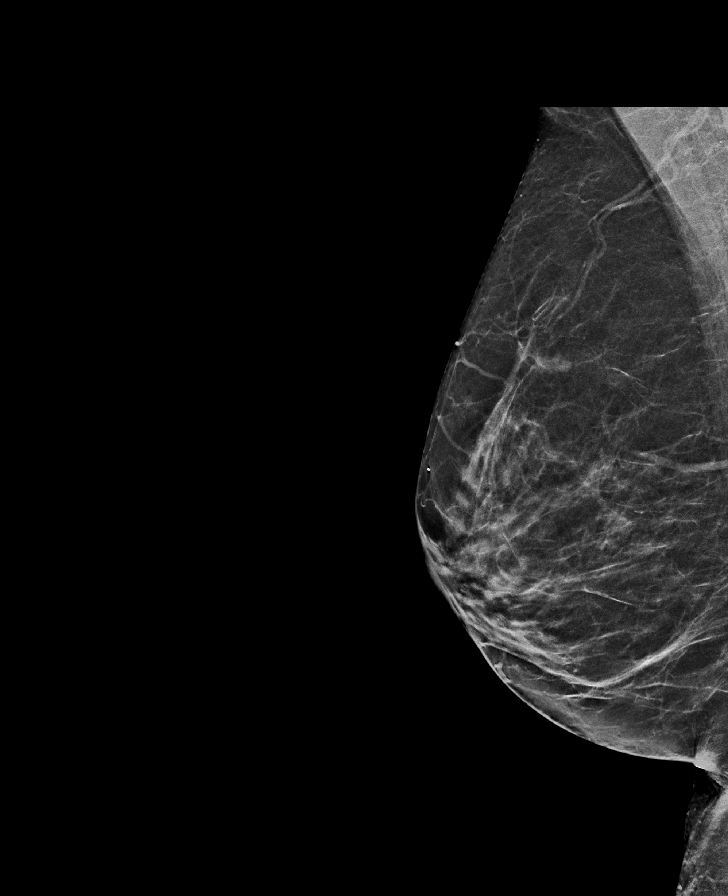

[L MLO synth-2D]
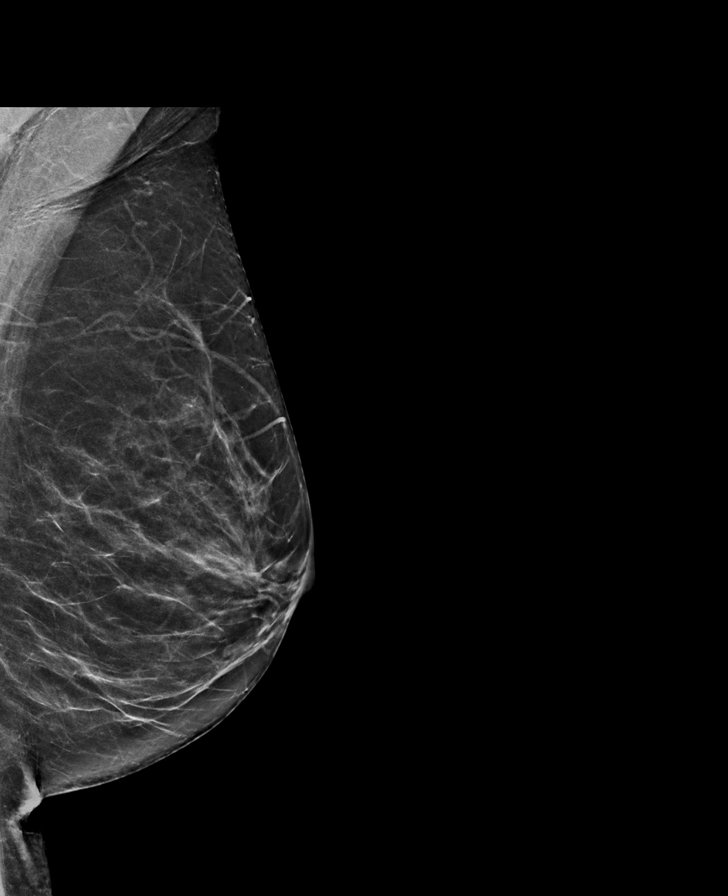

[L MLO tomo · tomo slice 34/67.0]
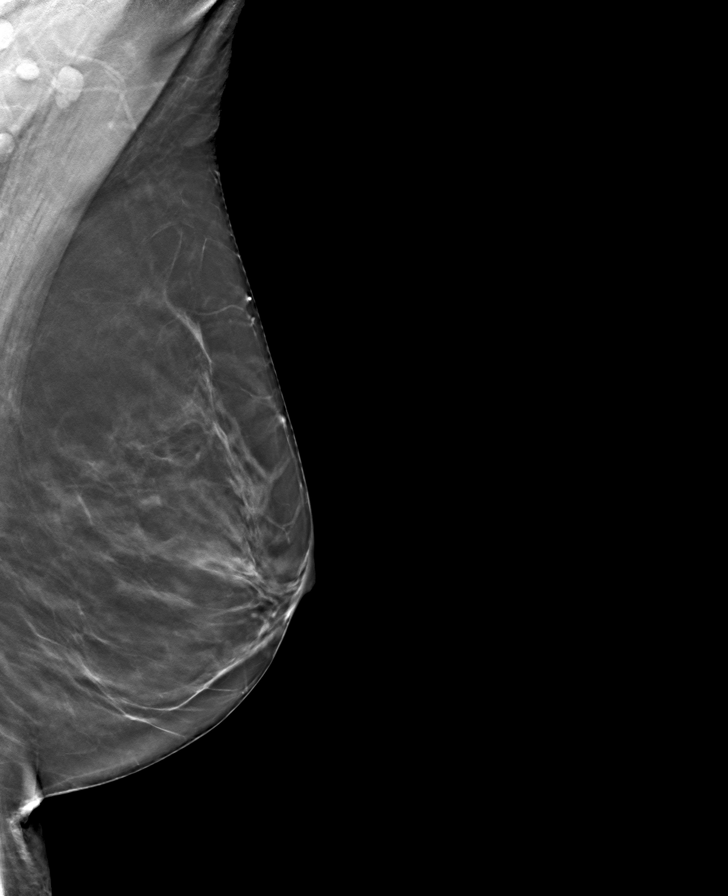

[R CC tomo · tomo slice 35/69.0]
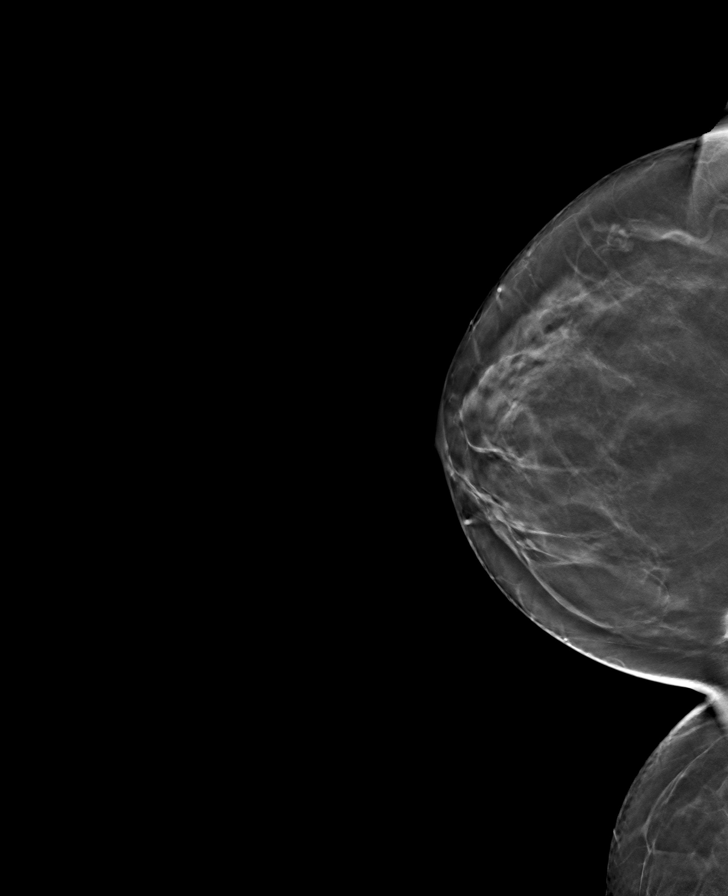

[R MLO tomo · tomo slice 31/60.0]
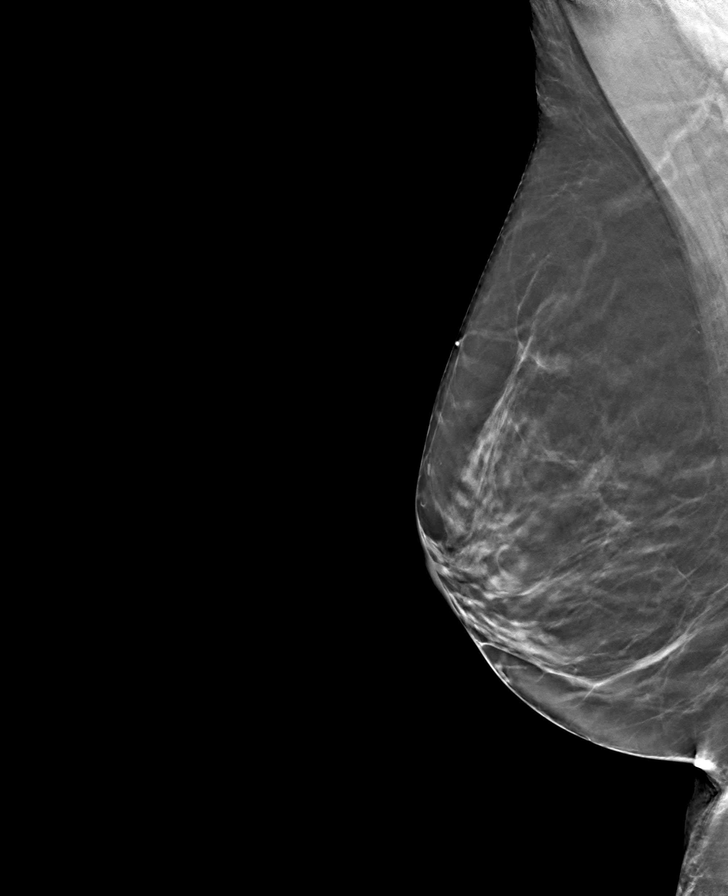

[L CC tomo · tomo slice 35/68.0]
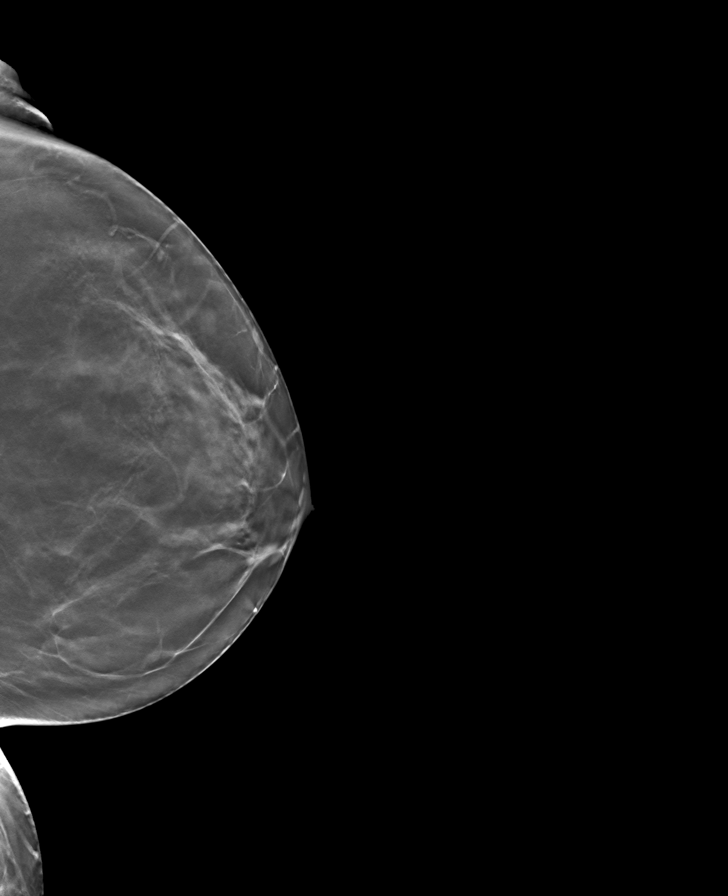

[8 of 24 positions shown; findings below may reference images not displayed]

ACR Breast Density Category b: There are scattered areas of
fibroglandular density.
FINDINGS: There are no findings suspicious for malignancy. Images were
processed with CAD.
IMPRESSION: No mammographic evidence of malignancy. A result letter of this
screening mammogram will be mailed directly to the patient.

RECOMMENDATION:
Screening mammogram in one year. (Code:[id number])

BI-RADS CATEGORY  1: Negative.

## 2019-09-12 ENCOUNTER — Other Ambulatory Visit: Payer: BC Managed Care – PPO | Admitting: Obstetrics and Gynecology

## 2019-10-13 ENCOUNTER — Other Ambulatory Visit (HOSPITAL_COMMUNITY): Payer: BC Managed Care – PPO

## 2019-10-14 ENCOUNTER — Emergency Department (HOSPITAL_COMMUNITY)
Admission: EM | Admit: 2019-10-14 | Discharge: 2019-10-14 | Disposition: A | Discharge status: Home / Self Care | Attending: Emergency Medicine | Admitting: Emergency Medicine

## 2019-10-14 ENCOUNTER — Other Ambulatory Visit: Payer: Self-pay

## 2019-10-14 ENCOUNTER — Emergency Department (HOSPITAL_COMMUNITY)

## 2019-10-14 ENCOUNTER — Encounter (HOSPITAL_COMMUNITY): Payer: Self-pay | Admitting: *Deleted

## 2019-10-14 DIAGNOSIS — S5001XA Contusion of right elbow, initial encounter: Secondary | ICD-10-CM | POA: Diagnosis not present

## 2019-10-14 DIAGNOSIS — S5401XA Injury of ulnar nerve at forearm level, right arm, initial encounter: Secondary | ICD-10-CM

## 2019-10-14 DIAGNOSIS — S6401XA Injury of ulnar nerve at wrist and hand level of right arm, initial encounter: Secondary | ICD-10-CM | POA: Diagnosis not present

## 2019-10-14 DIAGNOSIS — Y939 Activity, unspecified: Secondary | ICD-10-CM | POA: Insufficient documentation

## 2019-10-14 DIAGNOSIS — I10 Essential (primary) hypertension: Secondary | ICD-10-CM | POA: Diagnosis not present

## 2019-10-14 DIAGNOSIS — F17210 Nicotine dependence, cigarettes, uncomplicated: Secondary | ICD-10-CM | POA: Diagnosis not present

## 2019-10-14 DIAGNOSIS — W228XXA Striking against or struck by other objects, initial encounter: Secondary | ICD-10-CM | POA: Diagnosis not present

## 2019-10-14 DIAGNOSIS — Y990 Civilian activity done for income or pay: Secondary | ICD-10-CM | POA: Diagnosis not present

## 2019-10-14 DIAGNOSIS — S59911A Unspecified injury of right forearm, initial encounter: Secondary | ICD-10-CM | POA: Diagnosis present

## 2019-10-14 DIAGNOSIS — Y9263 Factory as the place of occurrence of the external cause: Secondary | ICD-10-CM | POA: Diagnosis not present

## 2019-10-14 DIAGNOSIS — S5011XA Contusion of right forearm, initial encounter: Secondary | ICD-10-CM | POA: Diagnosis not present

## 2019-10-14 DIAGNOSIS — Z79899 Other long term (current) drug therapy: Secondary | ICD-10-CM | POA: Diagnosis not present

## 2019-10-14 MED ORDER — IBUPROFEN 400 MG PO TABS
600.0000 mg | ORAL_TABLET | Freq: Once | ORAL | Status: AC
  Administered 2019-10-14: 600 mg via ORAL
  Filled 2019-10-14: qty 2

## 2019-10-14 NOTE — ED Triage Notes (Signed)
Pt states she was at work and hit her right elbow on one of the machines; since the incident pt has had pain with numbness and tingling going up and down the arm with limited ROM; pt has radial pulse

## 2019-10-14 NOTE — ED Provider Notes (Signed)
[facility] EMERGENCY DEPARTMENT Provider Note   CSN: [id number] Arrival date & time: 10/14/19  0124   Time seen 1:55 AM  History Chief Complaint  Patient presents with  . Arm Pain    [name] is a 54 y.o. female.  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  She states recently they moved the equipment closer together to make room for another piece of equipment.  She was trying to shut down her machine tonight.  She has to reach back between 2 machines to reach to the off button.  She states when she pulled her right arm back she had a piece of metal on the machine next to hers.  She hit the lateral aspect of her right elbow.  She states she had immediate numbness of her whole arm.  This happened about 12:15 AM.  Patient states currently she has some tingling in her right hand and she has some numbness of her upper arm.  She is right-handed.  She states she drove herself to the ED and she had to keep her right hand on the lower part of the steering wheel.  PCP Scherrie Bateman   Past Medical History:  Diagnosis Date  . Hypertension   . Vaginal Pap smear, abnormal     Patient Active Problem List   Diagnosis Date Noted  . Abdominal pain 06/19/2019  . Preventative health care 06/19/2019  . Chronic right shoulder pain 02/07/2017  . Pain in joint, shoulder region 01/18/2016  . CIN I (cervical intraepithelial neoplasia I) 01/13/2016  . Right shoulder pain 10/05/2015  . Primary osteoarthritis of right foot 05/05/2014  . Ganglion cyst of foot 05/05/2014    Past Surgical History:  Procedure Laterality Date  . EYE SURGERY    . TONSILLECTOMY       OB History    Gravida  2   Para  1   Term      Preterm  1   AB  1   Living  1     SAB      TAB      Ectopic      Multiple      Live Births  1           Family History  Problem Relation Age of Onset  . Hypertension Mother   . Colon cancer  Neg Hx     Social History   Tobacco Use  . Smoking status: Current Every Day Smoker    Packs/day: 0.50    Years: 30.00    Pack years: 15.00    Types: Cigarettes  . Smokeless tobacco: Never Used  Substance Use Topics  . Alcohol use: Yes    Comment: occassional  . Drug use: No  employed  Home Medications Prior to Admission medications   Medication Sig Start Date End Date Taking? Authorizing Provider  amLODipine (NORVASC) 5 MG tablet Take 5 mg by mouth daily.    [provider]  Cholecalciferol (VITAMIN D3 SUPER STRENGTH) 50 MCG (2000 UT) TABS Take 2,000 Units by mouth daily.     [provider]  HYDROcodone-acetaminophen (NORCO/VICODIN) 5-325 MG tablet One tablet every sixhours as needed for acute pain. Patient taking differently: as needed. One tablet every sixhours as needed for acute pain. 03/05/19   [name], MD    Allergies    Patient has no known allergies.  Review of Systems  Review of Systems  All other systems reviewed and are negative.   Physical Exam Updated Vital Signs BP (!) 131/99   Pulse 80   Temp 98.9 F (37.2 C) (Oral)   Resp 18   Ht 5\' 3"  (1.6 m)   Wt 64.9 kg   LMP 01/23/2018   SpO2 100%   BMI 25.33 kg/m   Physical Exam Vitals and nursing note reviewed.  Constitutional:      Appearance: Normal appearance. She is normal weight.  HENT:     Head: Normocephalic and atraumatic.     Right Ear: External ear normal.     Left Ear: External ear normal.  Eyes:     Extraocular Movements: Extraocular movements intact.     Conjunctiva/sclera: Conjunctivae normal.     Pupils: Pupils are equal, round, and reactive to light.  Cardiovascular:     Rate and Rhythm: Normal rate.  Pulmonary:     Effort: Pulmonary effort is normal. No respiratory distress.  Musculoskeletal:        General: Swelling and tenderness present.     Cervical back: Normal range of motion.     Comments: Patient has good range of motion of her right upper arm,  she does not have pain with supination and pronation in her elbow.  There is no joint effusion seen.  There does appear to be some swelling over the lateral epicondyle of the right elbow.  She has good distal pulses.  She has intact radial, median and ulnar nerves.  Skin:    General: Skin is warm and dry.  Neurological:     General: No focal deficit present.     Mental Status: She is alert and oriented to person, place, and time.     Cranial Nerves: No cranial nerve deficit.  Psychiatric:        Mood and Affect: Mood normal.        Behavior: Behavior normal.        Thought Content: Thought content normal.       ED Results / Procedures / Treatments   Labs (all labs ordered are listed, but only abnormal results are displayed) Labs Reviewed - No data to display  EKG None  Radiology DG Elbow Complete Right  Result Date: 10/14/2019 CLINICAL DATA:  Hit lateral condyle on metal part at work. Right elbow pain. EXAM: RIGHT ELBOW - COMPLETE 3+ VIEW COMPARISON:  None. FINDINGS: There is no evidence of fracture, dislocation, or joint effusion. There is no evidence of arthropathy or other focal bone abnormality. Soft tissues are unremarkable. IMPRESSION: Negative radiographs of the right elbow. Electronically Signed   By: Keith Rake M.D.   On: 10/14/2019 02:35    Procedures Procedures (including critical care time)  Medications Ordered in ED Medications  ibuprofen (ADVIL) tablet 600 mg (600 mg Oral Given 10/14/19 0220)    ED Course  I have reviewed the triage vital signs and the nursing notes.  Pertinent labs & imaging results that were available during my care of the patient were reviewed by me and considered in my medical decision making (see chart for details).    MDM Rules/Calculators/A&P                     X-ray was obtained to rule out fracture or chip being of the lateral epicondyle.  I suspect she may have a contusion of her ulnar nerve that should improve.  If not she  will need to see a hand  specialist.  Final Clinical Impression(s) / ED Diagnoses Final diagnoses:  Contusion of right elbow, initial encounter  Contusion of right ulnar nerve, initial encounter    Rx / DC Orders ED Discharge Orders    None    OTC ibuprofen  Plan discharge  Rolland Porter, MD, Barbette Or, MD 10/14/19 [phone]

## 2019-10-14 NOTE — Discharge Instructions (Addendum)
Use ice packs over the tender area of your elbow.  Make sure you wrap it up in a towel.  You can take ibuprofen 600 mg 4 times a day if needed.  I suspect when you hit your elbow you also hit the nerve that is close to that bone in your elbow, it should improve over the next couple of days.  You will need to be seen at a Workmen's [employer] facility today either one used or recommended by your work or you can go to one of the two in [place] that are listed above.

## 2019-10-15 ENCOUNTER — Encounter: Payer: Self-pay | Admitting: *Deleted

## 2019-10-15 ENCOUNTER — Other Ambulatory Visit (HOSPITAL_COMMUNITY)
Admission: RE | Admit: 2019-10-15 | Discharge: 2019-10-15 | Disposition: A | Discharge status: Home / Self Care | Payer: BC Managed Care – PPO | Source: Ambulatory Visit | Attending: Internal Medicine | Admitting: Internal Medicine

## 2019-10-15 ENCOUNTER — Encounter (HOSPITAL_COMMUNITY)
Admission: RE | Admit: 2019-10-15 | Discharge: 2019-10-15 | Disposition: A | Discharge status: Home / Self Care | Payer: BC Managed Care – PPO | Source: Ambulatory Visit | Attending: Internal Medicine | Admitting: Internal Medicine

## 2019-10-15 ENCOUNTER — Encounter (HOSPITAL_COMMUNITY): Payer: Self-pay

## 2019-10-15 ENCOUNTER — Telehealth: Payer: Self-pay | Admitting: *Deleted

## 2019-10-15 ENCOUNTER — Other Ambulatory Visit: Payer: Self-pay

## 2019-10-15 DIAGNOSIS — Z20822 Contact with and (suspected) exposure to covid-19: Secondary | ICD-10-CM | POA: Insufficient documentation

## 2019-10-15 DIAGNOSIS — Z01812 Encounter for preprocedural laboratory examination: Secondary | ICD-10-CM | POA: Diagnosis not present

## 2019-10-15 LAB — SARS CORONAVIRUS 2 (TAT 6-24 HRS): SARS Coronavirus 2: NEGATIVE

## 2019-10-15 NOTE — Telephone Encounter (Signed)
Procedure rescheduled to 11/14/2019 to 1:15 due to inclement weather.  New instructions and Covid screening information mailed to pt.

## 2019-10-15 NOTE — Patient Instructions (Signed)
Your procedure is scheduled on: 10/16/2019  Report to [facility] at     1:15 PM.  Call this number if you have problems the morning of surgery: [phone]   Remember:              Follow Directions on the letter you received from Your Physician's office regarding the Bowel Prep              No Smoking the day of Procedure :   Take these medicines the morning of surgery with A SIP OF WATER: Amlodipine and hydrocodone if needed   Do not wear jewelry, make-up or nail polish.    Do not bring valuables to the hospital.  Contacts, dentures or bridgework may not be worn into surgery.  .   Patients discharged the day of surgery will not be allowed to drive home.     Colonoscopy, Adult, Care After This sheet gives you information about how to care for yourself after your procedure. Your health care provider may also give you more specific instructions. If you have problems or questions, contact your health care provider. What can I expect after the procedure? After the procedure, it is common to have:  A small amount of blood in your stool for 24 hours after the procedure.  Some gas.  Mild abdominal cramping or bloating.  Follow these instructions at home: General instructions   For the first 24 hours after the procedure: ? Do not drive or use machinery. ? Do not sign important documents. ? Do not drink alcohol. ? Do your regular daily activities at a slower pace than normal. ? Eat soft, easy-to-digest foods. ? Rest often.  Take over-the-counter or prescription medicines only as told by your health care provider.  It is up to you to get the results of your procedure. Ask your health care provider, or the department performing the procedure, when your results will be ready. Relieving cramping and bloating  Try walking around when you have cramps or feel bloated.  Apply heat to your abdomen as told by your health care provider. Use a heat source that your health care  provider recommends, such as a moist heat pack or a heating pad. ? Place a towel between your skin and the heat source. ? Leave the heat on for 20-30 minutes. ? Remove the heat if your skin turns bright red. This is especially important if you are unable to feel pain, heat, or cold. You may have a greater risk of getting burned. Eating and drinking  Drink enough fluid to keep your urine clear or pale yellow.  Resume your normal diet as instructed by your health care provider. Avoid heavy or fried foods that are hard to digest.  Avoid drinking alcohol for as long as instructed by your health care provider. Contact a health care provider if:  You have blood in your stool 2-3 days after the procedure. Get help right away if:  You have more than a small spotting of blood in your stool.  You pass large blood clots in your stool.  Your abdomen is swollen.  You have nausea or vomiting.  You have a fever.  You have increasing abdominal pain that is not relieved with medicine. This information is not intended to replace advice given to you by your health care provider. Make sure you discuss any questions you have with your health care provider. Document Released: 03/28/2004 Document Revised: 05/08/2016 Document Reviewed: 10/26/2015 Elsevier Interactive Patient Education  2018 [place].

## 2019-10-20 ENCOUNTER — Encounter: Payer: Self-pay | Admitting: Internal Medicine

## 2019-10-20 ENCOUNTER — Telehealth: Payer: Self-pay | Admitting: Internal Medicine

## 2019-10-20 ENCOUNTER — Ambulatory Visit: Payer: BC Managed Care – PPO | Admitting: Nurse Practitioner

## 2019-10-20 NOTE — Progress Notes (Deleted)
Referring Provider: Jake Samples, PA* Primary Care Physician:  Jake Samples, PA-C Primary GI:  Dr. Gala Romney  No chief complaint on file.   HPI:   [name] is a 54 y.o. female who presents for follow-up on abdominal pain and needed colonoscopy.  Initially referred for colonoscopy by primary care.  No previous colonoscopy completed, intermittent abdominal pain 3 weeks ago that was intermittent for a couple days and stopped for multiple weeks.  Pain is "mid abdomen, feels like a ball, self resolves."  Not associated with eating, improves after bowel movement which she has daily to twice daily consistently [place] 4.  No hematochezia, occasional diarrhea depending on foods.  Rare reflux.  No other overt GI complaints.  Recommended colonoscopy, abdominal x-ray, follow-up in 3 to 4 months.  X-ray was not completed.  Colonoscopy initially scheduled for 08/05/2019 but had to be rescheduled due to patient's employer would not let her have the day off.  It was rescheduled to February 18.  This is again rescheduled to 11/14/2019 due to inclement weather.  Today she states   Past Medical History:  Diagnosis Date  . Hypertension   . Vaginal Pap smear, abnormal     Past Surgical History:  Procedure Laterality Date  . EYE SURGERY    . TONSILLECTOMY      Current Outpatient Medications  Medication Sig Dispense Refill  . amLODipine (NORVASC) 5 MG tablet Take 5 mg by mouth daily.    . Cholecalciferol (VITAMIN D3 SUPER STRENGTH) 50 MCG (2000 UT) TABS Take 2,000 Units by mouth daily.     Marland Kitchen HYDROcodone-acetaminophen (NORCO/VICODIN) 5-325 MG tablet One tablet every sixhours as needed for acute pain. (Patient taking differently: as needed. One tablet every sixhours as needed for acute pain.) 25 tablet 0   No current facility-administered medications for this visit.    Allergies as of 10/20/2019  . (No Known Allergies)    Family History  Problem Relation Age of Onset  .  Hypertension Mother   . Colon cancer Neg Hx     Social History   Socioeconomic History  . Marital status: Married    Spouse name: Not on file  . Number of children: Not on file  . Years of education: Not on file  . Highest education level: Not on file  Occupational History  . Not on file  Tobacco Use  . Smoking status: Current Every Day Smoker    Packs/day: 0.50    Years: 30.00    Pack years: 15.00    Types: Cigarettes  . Smokeless tobacco: Never Used  Substance and Sexual Activity  . Alcohol use: Yes    Comment: occassional  . Drug use: No  . Sexual activity: Yes    Birth control/protection: None  Other Topics Concern  . Not on file  Social History Narrative  . Not on file   Social Determinants of Health   Financial Resource Strain:   . Difficulty of Paying Living Expenses: Not on file  Food Insecurity:   . Worried About Charity fundraiser in the Last Year: Not on file  . Ran Out of Food in the Last Year: Not on file  Transportation Needs:   . Lack of Transportation (Medical): Not on file  . Lack of Transportation (Non-Medical): Not on file  Physical Activity:   . Days of Exercise per Week: Not on file  . Minutes of Exercise per Session: Not on file  Stress:   .  Feeling of Stress : Not on file  Social Connections:   . Frequency of Communication with Friends and Family: Not on file  . Frequency of Social Gatherings with Friends and Family: Not on file  . Attends Religious Services: Not on file  . Active Member of Clubs or Organizations: Not on file  . Attends Archivist Meetings: Not on file  . Marital Status: Not on file    Review of Systems: General: Negative for anorexia, weight loss, fever, chills, fatigue, weakness. Eyes: Negative for vision changes.  ENT: Negative for hoarseness, difficulty swallowing , nasal congestion. CV: Negative for chest pain, angina, palpitations, dyspnea on exertion, peripheral edema.  Respiratory: Negative for  dyspnea at rest, dyspnea on exertion, cough, sputum, wheezing.  GI: See history of present illness. GU:  Negative for dysuria, hematuria, urinary incontinence, urinary frequency, nocturnal urination.  MS: Negative for joint pain, low back pain.  Derm: Negative for rash or itching.  Neuro: Negative for weakness, abnormal sensation, seizure, frequent headaches, memory loss, confusion.  Psych: Negative for anxiety, depression, suicidal ideation, hallucinations.  Endo: Negative for unusual weight change.  Heme: Negative for bruising or bleeding. Allergy: Negative for rash or hives.   Physical Exam: LMP 01/23/2018  General:   Alert and oriented. Pleasant and cooperative. Well-nourished and well-developed.  Head:  Normocephalic and atraumatic. Eyes:  Without icterus, sclera clear and conjunctiva pink.  Ears:  Normal auditory acuity. Mouth:  No deformity or lesions, oral mucosa pink.  Throat/Neck:  Supple, without mass or thyromegaly. Cardiovascular:  S1, S2 present without murmurs appreciated. Normal pulses noted. Extremities without clubbing or edema. Respiratory:  Clear to auscultation bilaterally. No wheezes, rales, or rhonchi. No distress.  Gastrointestinal:  +BS, soft, non-tender and non-distended. No HSM noted. No guarding or rebound. No masses appreciated.  Rectal:  Deferred  Musculoskalatal:  Symmetrical without gross deformities. Normal posture. Skin:  Intact without significant lesions or rashes. Neurologic:  Alert and oriented x4;  grossly normal neurologically. Psych:  Alert and cooperative. Normal mood and affect. Heme/Lymph/Immune: No significant cervical adenopathy. No excessive bruising noted.    10/20/2019 8:13 AM   Disclaimer: This note was dictated with voice recognition software. Similar sounding words can inadvertently be transcribed and may not be corrected upon review.

## 2019-10-20 NOTE — Telephone Encounter (Signed)
PATIENT WAS A NO SHOW AND LETTER SENT  °

## 2019-11-11 ENCOUNTER — Other Ambulatory Visit: Payer: Self-pay

## 2019-11-11 ENCOUNTER — Encounter (HOSPITAL_COMMUNITY)
Admission: RE | Admit: 2019-11-11 | Discharge: 2019-11-11 | Disposition: A | Discharge status: Home / Self Care | Payer: BC Managed Care – PPO | Source: Ambulatory Visit | Attending: Internal Medicine | Admitting: Internal Medicine

## 2019-11-12 ENCOUNTER — Other Ambulatory Visit (HOSPITAL_COMMUNITY)
Admission: RE | Admit: 2019-11-12 | Discharge: 2019-11-12 | Disposition: A | Discharge status: Home / Self Care | Payer: BC Managed Care – PPO | Source: Ambulatory Visit | Attending: Internal Medicine | Admitting: Internal Medicine

## 2019-11-12 ENCOUNTER — Other Ambulatory Visit: Payer: Self-pay

## 2019-11-12 DIAGNOSIS — Z20822 Contact with and (suspected) exposure to covid-19: Secondary | ICD-10-CM | POA: Diagnosis not present

## 2019-11-12 DIAGNOSIS — Z01812 Encounter for preprocedural laboratory examination: Secondary | ICD-10-CM | POA: Diagnosis not present

## 2019-11-12 LAB — SARS CORONAVIRUS 2 (TAT 6-24 HRS): SARS Coronavirus 2: NEGATIVE

## 2019-11-14 ENCOUNTER — Encounter (HOSPITAL_COMMUNITY): Payer: Self-pay | Admitting: Internal Medicine

## 2019-11-14 ENCOUNTER — Encounter (HOSPITAL_COMMUNITY)
Admission: RE | Disposition: A | Discharge status: Home / Self Care | Payer: Self-pay | Source: Home / Self Care | Attending: Internal Medicine

## 2019-11-14 ENCOUNTER — Ambulatory Visit (HOSPITAL_COMMUNITY): Payer: BC Managed Care – PPO | Admitting: Anesthesiology

## 2019-11-14 ENCOUNTER — Ambulatory Visit (HOSPITAL_COMMUNITY)
Admission: RE | Admit: 2019-11-14 | Discharge: 2019-11-14 | Disposition: A | Discharge status: Home / Self Care | Payer: BC Managed Care – PPO | Attending: Internal Medicine | Admitting: Internal Medicine

## 2019-11-14 DIAGNOSIS — M1990 Unspecified osteoarthritis, unspecified site: Secondary | ICD-10-CM | POA: Diagnosis not present

## 2019-11-14 DIAGNOSIS — K5730 Diverticulosis of large intestine without perforation or abscess without bleeding: Secondary | ICD-10-CM | POA: Insufficient documentation

## 2019-11-14 DIAGNOSIS — Z1211 Encounter for screening for malignant neoplasm of colon: Secondary | ICD-10-CM | POA: Diagnosis not present

## 2019-11-14 DIAGNOSIS — I10 Essential (primary) hypertension: Secondary | ICD-10-CM | POA: Diagnosis not present

## 2019-11-14 DIAGNOSIS — F17210 Nicotine dependence, cigarettes, uncomplicated: Secondary | ICD-10-CM | POA: Insufficient documentation

## 2019-11-14 DIAGNOSIS — D122 Benign neoplasm of ascending colon: Secondary | ICD-10-CM | POA: Diagnosis not present

## 2019-11-14 DIAGNOSIS — K635 Polyp of colon: Secondary | ICD-10-CM | POA: Diagnosis not present

## 2019-11-14 HISTORY — PX: POLYPECTOMY: SHX5525

## 2019-11-14 HISTORY — PX: COLONOSCOPY WITH PROPOFOL: SHX5780

## 2019-11-14 SURGERY — COLONOSCOPY WITH PROPOFOL
Anesthesia: General

## 2019-11-14 MED ORDER — PROPOFOL 10 MG/ML IV BOLUS
INTRAVENOUS | Status: DC | PRN
  Administered 2019-11-14: 40 mg via INTRAVENOUS
  Administered 2019-11-14 (×3): 20 mg via INTRAVENOUS

## 2019-11-14 MED ORDER — LACTATED RINGERS IV SOLN: INTRAVENOUS | Status: DC | PRN

## 2019-11-14 MED ORDER — PROPOFOL 500 MG/50ML IV EMUL
INTRAVENOUS | Status: DC | PRN
  Administered 2019-11-14: 125 ug/kg/min via INTRAVENOUS

## 2019-11-14 MED ORDER — LACTATED RINGERS IV SOLN: Freq: Once | INTRAVENOUS | Status: AC

## 2019-11-14 MED ORDER — LIDOCAINE HCL (CARDIAC) PF 100 MG/5ML IV SOSY
PREFILLED_SYRINGE | INTRAVENOUS | Status: DC | PRN
  Administered 2019-11-14: 80 mg via INTRAVENOUS

## 2019-11-14 MED ORDER — GLYCOPYRROLATE 0.2 MG/ML IJ SOLN
INTRAMUSCULAR | Status: DC | PRN
  Administered 2019-11-14 (×2): .1 mg via INTRAVENOUS

## 2019-11-14 NOTE — Anesthesia Preprocedure Evaluation (Addendum)
Anesthesia Evaluation  Patient identified by MRN, date of birth, ID band Patient awake    Reviewed: Allergy & Precautions, NPO status , Patient's Chart, lab work & pertinent test results  History of Anesthesia Complications Negative for: history of anesthetic complications  Airway Mallampati: III  TM Distance: >3 FB Neck ROM: Full    Dental  (+) Teeth Intact   Pulmonary Current Smoker and Patient abstained from smoking.,    breath sounds clear to auscultation       Cardiovascular Exercise Tolerance: Good hypertension, Pt. on medications  Rhythm:Regular Rate:Normal     Neuro/Psych negative neurological ROS  negative psych ROS   GI/Hepatic negative GI ROS, Neg liver ROS,   Endo/Other  negative endocrine ROS  Renal/GU negative Renal ROS     Musculoskeletal  (+) Arthritis ,   Abdominal   Peds  Hematology negative hematology ROS (+)   Anesthesia Other Findings   Reproductive/Obstetrics                           Anesthesia Physical Anesthesia Plan  ASA: II  Anesthesia Plan: General   Post-op Pain Management:    Induction: Intravenous  PONV Risk Score and Plan: 2 and Treatment may vary due to age or medical condition and TIVA  Airway Management Planned: Nasal Cannula, Natural Airway and Simple Face Mask  Additional Equipment:   Intra-op Plan:   Post-operative Plan:   Informed Consent: I have reviewed the patients History and Physical, chart, labs and discussed the procedure including the risks, benefits and alternatives for the proposed anesthesia with the patient or authorized representative who has indicated his/her understanding and acceptance.     Dental advisory given  Plan Discussed with: CRNA and Surgeon  Anesthesia Plan Comments:        Anesthesia Quick Evaluation

## 2019-11-14 NOTE — Op Note (Signed)
[facility] Patient Name: [name] Procedure Date: 11/14/2019 2:05 PM MRN: [id number] Date of Birth: 1966/03/04 Attending MD: Norvel Richards , MD CSN: [id number] Age: 54 Admit Type: Outpatient Procedure:                Colonoscopy Indications:              Screening for colorectal malignant neoplasm Providers:                Norvel Richards, MD, Jeanann Lewandowsky. Sharon Seller, RN,                            Raphael Gibney, Technician Referring MD:              Medicines:                Propofol per Anesthesia Complications:            No immediate complications. Estimated blood loss:                            Minimal. Estimated Blood Loss:     Estimated blood loss was minimal. Procedure:                Pre-Anesthesia Assessment:                           - Prior to the procedure, a History and Physical                            was performed, and patient medications and                            allergies were reviewed. The patient's tolerance of                            previous anesthesia was also reviewed. The risks                            and benefits of the procedure and the sedation                            options and risks were discussed with the patient.                            All questions were answered, and informed consent                            was obtained. Prior Anticoagulants: The patient has                            taken no previous anticoagulant or antiplatelet                            agents. ASA Grade Assessment: II - A patient with  mild systemic disease. After reviewing the risks                            and benefits, the patient was deemed in                            satisfactory condition to undergo the procedure.                           After obtaining informed consent, the colonoscope                            was passed under direct vision. Throughout the   procedure, the patient's blood pressure, pulse, and                            oxygen saturations were monitored continuously. The                            [id number] scope was introduced through                            the anus and advanced to the the cecum, identified                            by appendiceal orifice and ileocecal valve. The                            colonoscopy was performed without difficulty. The                            patient tolerated the procedure well. The quality                            of the bowel preparation was adequate. Scope In: 2:21:28 PM Scope Out: 2:34:21 PM Scope Withdrawal Time: 0 hours 7 minutes 41 seconds  Total Procedure Duration: 0 hours 12 minutes 53 seconds  Findings:      The perianal and digital rectal examinations were normal.      Scattered medium-mouthed diverticula were found in the entire colon.      A 6 mm polyp was found in the ascending colon. The polyp was sessile.       The polyp was removed with a cold snare. Resection and retrieval were       complete. Estimated blood loss was minimal.      The entire examined colon appeared normal on direct and retroflexion       views. Rectal mucosa seen well on?"face. Rectal vault too small to       retroflex. Impression:               - Diverticulosis in the entire examined colon.                           - One 6 mm polyp in the ascending colon, removed  with a cold snare. Resected and retrieved.                           - The entire examined colon is normal on direct and                            retroflexion views. Moderate Sedation:      Moderate (conscious) sedation was personally administered by an       anesthesia professional. The following parameters were monitored: oxygen       saturation, heart rate, blood pressure, respiratory rate, EKG, adequacy       of pulmonary ventilation, and response to care. Recommendation:           -  Patient has a contact number available for                            emergencies. The signs and symptoms of potential                            delayed complications were discussed with the                            patient. Return to normal activities tomorrow.                            Written discharge instructions were provided to the                            patient.                           - Advance diet as tolerated.                           - Continue present medications.                           - Repeat colonoscopy date to be determined after                            pending pathology results are reviewed for                            surveillance.                           - Return to GI office (date not yet determined). Procedure Code(s):        --- Professional ---                           [phone], Colonoscopy, flexible; with removal of                            tumor(s), polyp(s), or other lesion(s) by snare  technique Diagnosis Code(s):        --- Professional ---                           Z12.11, Encounter for screening for malignant                            neoplasm of colon                           K63.5, Polyp of colon                           K57.30, Diverticulosis of large intestine without                            perforation or abscess without bleeding CPT copyright 2019 American Medical Association. All rights reserved. The codes documented in this report are preliminary and upon coder review may  be revised to meet current compliance requirements. Cristopher Estimable. Airon Sahni, MD Norvel Richards, MD 11/14/2019 2:40:02 PM This report has been signed electronically. Number of Addenda: 0

## 2019-11-14 NOTE — Transfer of Care (Signed)
Immediate Anesthesia Transfer of Care Note  Patient: [name]  Procedure(s) Performed: COLONOSCOPY WITH PROPOFOL (N/A ) POLYPECTOMY  Patient Location: PACU  Anesthesia Type:MAC and General  Level of Consciousness: sedated  Airway & Oxygen Therapy: Patient Spontanous Breathing  Post-op Assessment: Report given to RN and Post -op Vital signs reviewed and stable  Post vital signs: Reviewed and stable  Last Vitals:  Vitals Value Taken Time  BP    Temp    Pulse 73 11/14/19 1444  Resp 15 11/14/19 1444  SpO2 100 % 11/14/19 1444  Vitals shown include unvalidated device data.  Last Pain:  Vitals:   11/14/19 1415  PainSc: 0-No pain         Complications: No apparent anesthesia complications

## 2019-11-14 NOTE — Discharge Instructions (Signed)
Colonoscopy Discharge Instructions  Read the instructions outlined below and refer to this sheet in the next few weeks. These discharge instructions provide you with general information on caring for yourself after you leave the hospital. Your doctor may also give you specific instructions. While your treatment has been planned according to the most current medical practices available, unavoidable complications occasionally occur. If you have any problems or questions after discharge, call Dr. Gala Romney at [phone]. ACTIVITY  You may resume your regular activity, but move at a slower pace for the next 24 hours.   Take frequent rest periods for the next 24 hours.   Walking will help get rid of the air and reduce the bloated feeling in your belly (abdomen).   No driving for 24 hours (because of the medicine (anesthesia) used during the test).    Do not sign any important legal documents or operate any machinery for 24 hours (because of the anesthesia used during the test).  NUTRITION  Drink plenty of fluids.   You may resume your normal diet as instructed by your doctor.   Begin with a light meal and progress to your normal diet. Heavy or fried foods are harder to digest and may make you feel sick to your stomach (nauseated).   Avoid alcoholic beverages for 24 hours or as instructed.  MEDICATIONS  You may resume your normal medications unless your doctor tells you otherwise.  WHAT YOU CAN EXPECT TODAY  Some feelings of bloating in the abdomen.   Passage of more gas than usual.   Spotting of blood in your stool or on the toilet paper.  IF YOU HAD POLYPS REMOVED DURING THE COLONOSCOPY:  No aspirin products for 7 days or as instructed.   No alcohol for 7 days or as instructed.   Eat a soft diet for the next 24 hours.  FINDING OUT THE RESULTS OF YOUR TEST Not all test results are available during your visit. If your test results are not back during the visit, make an appointment  with your caregiver to find out the results. Do not assume everything is normal if you have not heard from your caregiver or the medical facility. It is important for you to follow up on all of your test results.  SEEK IMMEDIATE MEDICAL ATTENTION IF:  You have more than a spotting of blood in your stool.   Your belly is swollen (abdominal distention).   You are nauseated or vomiting.   You have a temperature over 101.   You have abdominal pain or discomfort that is severe or gets worse throughout the day.   Colon polyp and diverticulosis information provided  Further recommendations to follow pending review of pathology report  At patient request, I called Dara Lords at [phone] and reviewed results.  PATIENT INSTRUCTIONS POST-ANESTHESIA  IMMEDIATELY FOLLOWING SURGERY:  Do not drive or operate machinery for the first twenty four hours after surgery.  Do not make any important decisions for twenty four hours after surgery or while taking narcotic pain medications or sedatives.  If you develop intractable nausea and vomiting or a severe headache please notify your doctor immediately.  FOLLOW-UP:  Please make an appointment with your surgeon as instructed. You do not need to follow up with anesthesia unless specifically instructed to do so.  WOUND CARE INSTRUCTIONS (if applicable):  Keep a dry clean dressing on the anesthesia/puncture wound site if there is drainage.  Once the wound has quit draining you may leave it open to  air.  Generally you should leave the bandage intact for twenty four hours unless there is drainage.  If the epidural site drains for more than 36-48 hours please call the anesthesia department.  QUESTIONS?:  Please feel free to call your physician or the hospital operator if you have any questions, and they will be happy to assist you.      Colon Polyps  Polyps are tissue growths inside the body. Polyps can grow in many places, including the large  intestine (colon). A polyp may be a round bump or a mushroom-shaped growth. You could have one polyp or several. Most colon polyps are noncancerous (benign). However, some colon polyps can become cancerous over time. Finding and removing the polyps early can help prevent this. What are the causes? The exact cause of colon polyps is not known. What increases the risk? You are more likely to develop this condition if you:  Have a family history of colon cancer or colon polyps.  Are older than 78 or older than 45 if you are African American.  Have inflammatory bowel disease, such as ulcerative colitis or Crohn's disease.  Have certain hereditary conditions, such as: ? Familial adenomatous polyposis. ? Lynch syndrome. ? Turcot syndrome. ? Peutz-Jeghers syndrome.  Are overweight.  Smoke cigarettes.  Do not get enough exercise.  Drink too much alcohol.  Eat a diet that is high in fat and red meat and low in fiber.  Had childhood cancer that was treated with abdominal radiation. What are the signs or symptoms? Most polyps do not cause symptoms. If you have symptoms, they may include:  Blood coming from your rectum when having a bowel movement.  Blood in your stool. The stool may look dark red or black.  Abdominal pain.  A change in bowel habits, such as constipation or diarrhea. How is this diagnosed? This condition is diagnosed with a colonoscopy. This is a procedure in which a lighted, flexible scope is inserted into the anus and then passed into the colon to examine the area. Polyps are sometimes found when a colonoscopy is done as part of routine cancer screening tests. How is this treated? Treatment for this condition involves removing any polyps that are found. Most polyps can be removed during a colonoscopy. Those polyps will then be tested for cancer. Additional treatment may be needed depending on the results of testing. Follow these instructions at  home: Lifestyle  Maintain a healthy weight, or lose weight if recommended by your health care provider.  Exercise every day or as told by your health care provider.  Do not use any products that contain nicotine or tobacco, such as cigarettes and e-cigarettes. If you need help quitting, ask your health care provider.  If you drink alcohol, limit how much you have: ? 0-1 drink a day for women. ? 0-2 drinks a day for men.  Be aware of how much alcohol is in your drink. In the [place] one drink equals one 12 oz bottle of beer (355 mL), one 5 oz glass of wine (148 mL), or one 1 oz shot of hard liquor (44 mL). Eating and drinking   Eat foods that are high in fiber, such as fruits, vegetables, and whole grains.  Eat foods that are high in calcium and vitamin D, such as milk, cheese, yogurt, eggs, liver, fish, and broccoli.  Limit foods that are high in fat, such as fried foods and desserts.  Limit the amount of red meat and processed meat you  eat, such as hot dogs, sausage, bacon, and lunch meats. General instructions  Keep all follow-up visits as told by your health care provider. This is important. ? This includes having regularly scheduled colonoscopies. ? Talk to your health care provider about when you need a colonoscopy. Contact a health care provider if:  You have new or worsening bleeding during a bowel movement.  You have new or increased blood in your stool.  You have a change in bowel habits.  You lose weight for no known reason. Summary  Polyps are tissue growths inside the body. Polyps can grow in many places, including the colon.  Most colon polyps are noncancerous (benign), but some can become cancerous over time.  This condition is diagnosed with a colonoscopy.  Treatment for this condition involves removing any polyps that are found. Most polyps can be removed during a colonoscopy. This information is not intended to replace advice given to you by your health  care provider. Make sure you discuss any questions you have with your health care provider. Document Revised: 11/29/2017 Document Reviewed: 11/29/2017 Elsevier Patient Education  [place].   Diverticulosis  Diverticulosis is a condition that develops when small pouches (diverticula) form in the wall of the large intestine (colon). The colon is where water is absorbed and stool (feces) is formed. The pouches form when the inside layer of the colon pushes through weak spots in the outer layers of the colon. You may have a few pouches or many of them. The pouches usually do not cause problems unless they become inflamed or infected. When this happens, the condition is called diverticulitis. What are the causes? The cause of this condition is not known. What increases the risk? The following factors may make you more likely to develop this condition:  Being older than age 42. Your risk for this condition increases with age. Diverticulosis is rare among people younger than age 38. By age 52, many people have it.  Eating a low-fiber diet.  Having frequent constipation.  Being overweight.  Not getting enough exercise.  Smoking.  Taking over-the-counter pain medicines, like aspirin and ibuprofen.  Having a family history of diverticulosis. What are the signs or symptoms? In most people, there are no symptoms of this condition. If you do have symptoms, they may include:  Bloating.  Cramps in the abdomen.  Constipation or diarrhea.  Pain in the lower left side of the abdomen. How is this diagnosed? Because diverticulosis usually has no symptoms, it is most often diagnosed during an exam for other colon problems. The condition may be diagnosed by:  Using a flexible scope to examine the colon (colonoscopy).  Taking an X-ray of the colon after dye has been put into the colon (barium enema).  Having a CT scan. How is this treated? You may not need treatment for this  condition. Your health care provider may recommend treatment to prevent problems. You may need treatment if you have symptoms or if you previously had diverticulitis. Treatment may include:  Eating a high-fiber diet.  Taking a fiber supplement.  Taking a live bacteria supplement (probiotic).  Taking medicine to relax your colon. Follow these instructions at home: Medicines  Take over-the-counter and prescription medicines only as told by your health care provider.  If told by your health care provider, take a fiber supplement or probiotic. Constipation prevention Your condition may cause constipation. To prevent or treat constipation, you may need to:  Drink enough fluid to keep your urine  pale yellow.  Take over-the-counter or prescription medicines.  Eat foods that are high in fiber, such as beans, whole grains, and fresh fruits and vegetables.  Limit foods that are high in fat and processed sugars, such as fried or sweet foods.  General instructions  Try not to strain when you have a bowel movement.  Keep all follow-up visits as told by your health care provider. This is important. Contact a health care provider if you:  Have pain in your abdomen.  Have bloating.  Have cramps.  Have not had a bowel movement in 3 days. Get help right away if:  Your pain gets worse.  Your bloating becomes very bad.  You have a fever or chills, and your symptoms suddenly get worse.  You vomit.  You have bowel movements that are bloody or black.  You have bleeding from your rectum. Summary  Diverticulosis is a condition that develops when small pouches (diverticula) form in the wall of the large intestine (colon).  You may have a few pouches or many of them.  This condition is most often diagnosed during an exam for other colon problems.  Treatment may include increasing the fiber in your diet, taking supplements, or taking medicines. This information is not intended to  replace advice given to you by your health care provider. Make sure you discuss any questions you have with your health care provider. Document Revised: 03/13/2019 Document Reviewed: 03/13/2019 Elsevier Patient Education  [place].

## 2019-11-14 NOTE — Anesthesia Postprocedure Evaluation (Signed)
Anesthesia Post Note  Patient: [name]  Procedure(s) Performed: COLONOSCOPY WITH PROPOFOL (N/A ) POLYPECTOMY  Patient location during evaluation: PACU Anesthesia Type: General and MAC Level of consciousness: awake and patient cooperative Pain management: pain level controlled Vital Signs Assessment: post-procedure vital signs reviewed and stable Respiratory status: spontaneous breathing Cardiovascular status: stable Postop Assessment: no apparent nausea or vomiting Anesthetic complications: no     Last Vitals:  Vitals:   11/14/19 1442 11/14/19 1445  BP: 105/78   Pulse: 73   Resp: 16   Temp: 36.6 C 36.6 C  SpO2: 100%     Last Pain:  Vitals:   11/14/19 1442  PainSc: 0-No pain                 [name]

## 2019-11-14 NOTE — H&P (Signed)
@LOGO @   Primary Care Physician:  Jake Samples, PA-C Primary Gastroenterologist:  Dr. Gala Romney  Pre-Procedure History & Physical: HPI:  [name] is a 54 y.o. female is here for a screening colonoscopy.  First ever screening examination.  No bowel symptoms.  No family history of colon cancer.  Past Medical History:  Diagnosis Date  . Hypertension   . Vaginal Pap smear, abnormal     Past Surgical History:  Procedure Laterality Date  . EYE SURGERY    . TONSILLECTOMY      Prior to Admission medications   Medication Sig Start Date End Date Taking? Authorizing Provider  amLODipine (NORVASC) 5 MG tablet Take 5 mg by mouth daily.   Yes [provider]  Cholecalciferol (VITAMIN D3 SUPER STRENGTH) 50 MCG (2000 UT) TABS Take 2,000 Units by mouth daily.    Yes [provider]  HYDROcodone-acetaminophen (NORCO/VICODIN) 5-325 MG tablet One tablet every sixhours as needed for acute pain. Patient taking differently: as needed. One tablet every sixhours as needed for acute pain. 03/05/19   [name], MD    Allergies as of 06/19/2019  . (No Known Allergies)    Family History  Problem Relation Age of Onset  . Hypertension Mother   . Colon cancer Neg Hx     Social History   Socioeconomic History  . Marital status: Married    Spouse name: Not on file  . Number of children: Not on file  . Years of education: Not on file  . Highest education level: Not on file  Occupational History  . Not on file  Tobacco Use  . Smoking status: Current Every Day Smoker    Packs/day: 0.50    Years: 30.00    Pack years: 15.00    Types: Cigarettes  . Smokeless tobacco: Never Used  Substance and Sexual Activity  . Alcohol use: Yes    Comment: occassional  . Drug use: No  . Sexual activity: Yes    Birth control/protection: None  Other Topics Concern  . Not on file  Social History Narrative  . Not on file   Social Determinants of Health   Financial Resource  Strain:   . Difficulty of Paying Living Expenses:   Food Insecurity:   . Worried About Charity fundraiser in the Last Year:   . Arboriculturist in the Last Year:   Transportation Needs:   . Film/video editor (Medical):   Marland Kitchen Lack of Transportation (Non-Medical):   Physical Activity:   . Days of Exercise per Week:   . Minutes of Exercise per Session:   Stress:   . Feeling of Stress :   Social Connections:   . Frequency of Communication with Friends and Family:   . Frequency of Social Gatherings with Friends and Family:   . Attends Religious Services:   . Active Member of Clubs or Organizations:   . Attends Archivist Meetings:   Marland Kitchen Marital Status:   Intimate Partner Violence:   . Fear of Current or Ex-Partner:   . Emotionally Abused:   Marland Kitchen Physically Abused:   . Sexually Abused:     Review of Systems: See HPI, otherwise negative ROS  Physical Exam: BP 134/89 (BP Location: Left Arm)   Temp 98.1 F (36.7 C)   Resp 16   LMP 01/23/2018   SpO2 99%  General:   Alert,  Well-developed, well-nourished, pleasant and cooperative in NAD ly. Lungs:  Clear throughout to auscultation.  No wheezes, crackles, or rhonchi. No acute distress. Heart:  Regular rate and rhythm; no murmurs, clicks, rubs,  or gallops. Abdomen:  Soft, nontender and nondistended. No masses, hepatosplenomegaly or hernias noted. Normal bowel sounds, without guarding, and without rebound.    Impression/Plan: [name] is now here to undergo a screening colonoscopy.  First ever average rescreening examination.  Risks, benefits, limitations, imponderables and alternatives regarding colonoscopy have been reviewed with the patient. Questions have been answered. All parties agreeable.     Notice:  This dictation was prepared with Dragon dictation along with smaller phrase technology. Any transcriptional errors that result from this process are unintentional and may not be corrected upon  review.

## 2019-11-18 ENCOUNTER — Other Ambulatory Visit: Payer: Self-pay

## 2019-11-18 ENCOUNTER — Encounter: Payer: Self-pay | Admitting: *Deleted

## 2019-11-18 ENCOUNTER — Encounter: Payer: Self-pay | Admitting: Orthopaedic Surgery

## 2019-11-18 ENCOUNTER — Ambulatory Visit: Payer: BC Managed Care – PPO | Admitting: Orthopaedic Surgery

## 2019-11-18 VITALS — BP 115/80 | HR 78 | Temp 97.7°F | Ht 63.0 in | Wt 137.0 lb

## 2019-11-18 DIAGNOSIS — M25511 Pain in right shoulder: Secondary | ICD-10-CM | POA: Diagnosis not present

## 2019-11-18 DIAGNOSIS — G8929 Other chronic pain: Secondary | ICD-10-CM | POA: Diagnosis not present

## 2019-11-18 DIAGNOSIS — F17210 Nicotine dependence, cigarettes, uncomplicated: Secondary | ICD-10-CM | POA: Diagnosis not present

## 2019-11-18 LAB — SURGICAL PATHOLOGY

## 2019-11-18 MED ORDER — HYDROCODONE-ACETAMINOPHEN 5-325 MG PO TABS: ORAL_TABLET | ORAL | 0 refills | Status: DC

## 2019-11-18 NOTE — Progress Notes (Signed)
Patient [name], female DOB:1965/09/18, 54 y.o. [id number]  Chief Complaint  Patient presents with  . Shoulder Pain    Chronic right shoulder pain    HPI  [name] is a 54 y.o. female who has recurrence of right shoulder pain.  She has no trauma. She has pain with overhead use and rolling on it at night.  She has no swelling or numbness.   Body mass index is 24.27 kg/m.  ROS  Review of Systems  HENT: Negative for congestion.   Respiratory: Negative for cough and shortness of breath.   Cardiovascular: Negative for chest pain and leg swelling.       History of hypertension.  Endocrine: Positive for cold intolerance.  Musculoskeletal: Positive for arthralgias.  Allergic/Immunologic: Positive for environmental allergies.  All other systems reviewed and are negative.   All other systems reviewed and are negative.  The following is a summary of the past history medically, past history surgically, known current medicines, social history and family history.  This information is gathered electronically by the computer from prior information and documentation.  I review this each visit and have found including this information at this point in the chart is beneficial and informative.    Past Medical History:  Diagnosis Date  . Hypertension   . Vaginal Pap smear, abnormal     Past Surgical History:  Procedure Laterality Date  . EYE SURGERY    . TONSILLECTOMY      Family History  Problem Relation Age of Onset  . Hypertension Mother   . Colon cancer Neg Hx     Social History Social History   Tobacco Use  . Smoking status: Current Every Day Smoker    Packs/day: 0.50    Years: 30.00    Pack years: 15.00    Types: Cigarettes  . Smokeless tobacco: Never Used  Substance Use Topics  . Alcohol use: Yes    Comment: occassional  . Drug use: No    No Known Allergies  Current Outpatient Medications  Medication Sig Dispense Refill  .  amLODipine (NORVASC) 5 MG tablet Take 5 mg by mouth daily.    . Cholecalciferol (VITAMIN D3 SUPER STRENGTH) 50 MCG (2000 UT) TABS Take 2,000 Units by mouth daily.     Marland Kitchen HYDROcodone-acetaminophen (NORCO/VICODIN) 5-325 MG tablet One tablet every four hours as needed for acute pain.  Limit of five days per [place] statue. 30 tablet 0   No current facility-administered medications for this visit.     Physical Exam  Blood pressure 115/80, pulse 78, temperature 97.7 F (36.5 C), height 5\' 3"  (1.6 m), weight 137 lb (62.1 kg), last menstrual period 01/23/2018.  Constitutional: overall normal hygiene, normal nutrition, well developed, normal grooming, normal body habitus. Assistive device:none  Musculoskeletal: gait and station Limp none, muscle tone and strength are normal, no tremors or atrophy is present.  .  Neurological: coordination overall normal.  Deep tendon reflex/nerve stretch intact.  Sensation normal.  Cranial nerves II-XII intact.   Skin:   Normal overall no scars, lesions, ulcers or rashes. No psoriasis.  Psychiatric: Alert and oriented x 3.  Recent memory intact, remote memory unclear.  Normal mood and affect. Well groomed.  Good eye contact.  Cardiovascular: overall no swelling, no varicosities, no edema bilaterally, normal temperatures of the legs and arms, no clubbing, cyanosis and good capillary refill.  Lymphatic: palpation is normal.  Right shoulder has near full motion but pain in the extremes.  NV  intact.  Grips normal.  Neck normal. All other systems reviewed and are negative   The patient has been educated about the nature of the problem(s) and counseled on treatment options.  The patient appeared to understand what I have discussed and is in agreement with it.  Encounter Diagnoses  Name Primary?  . Chronic right shoulder pain Yes  . Cigarette nicotine dependence without complication     PLAN Call if any problems.  Precautions discussed.  Continue current  medications.   I will keep her out of work for two weeks then return.  Return to clinic 6 weeks   I have reviewed the [place] web site prior to prescribing narcotic medicine for this patient.   Electronically Signed [name], MD 3/23/20213:14 PM

## 2019-11-18 NOTE — Patient Instructions (Signed)
OUT OF WORK for two weeks, then return to work.

## 2019-11-19 ENCOUNTER — Encounter: Payer: Self-pay | Admitting: Internal Medicine

## 2019-12-04 ENCOUNTER — Telehealth: Payer: Self-pay | Admitting: Orthopaedic Surgery

## 2019-12-04 NOTE — Telephone Encounter (Signed)
Contacted patient to relay that we have not yet received a form for which she had begun a forms process for [employer] per our form and fee protocol. States she was temporarily laid off from work and is returning Monday, 12/08/19; said she will check at work and/'or short term [place]. Requests that we hold the form payment till she has further information. (Placed back in [employer] representative's box). Pt aware.

## 2019-12-30 ENCOUNTER — Encounter: Payer: Self-pay | Admitting: Orthopaedic Surgery

## 2019-12-30 ENCOUNTER — Other Ambulatory Visit: Payer: Self-pay

## 2019-12-30 ENCOUNTER — Ambulatory Visit: Payer: BC Managed Care – PPO | Admitting: Orthopaedic Surgery

## 2019-12-30 ENCOUNTER — Encounter: Payer: Self-pay | Admitting: Gastroenterology

## 2019-12-30 VITALS — BP 125/99 | HR 83 | Temp 97.4°F | Wt 140.0 lb

## 2019-12-30 DIAGNOSIS — F17210 Nicotine dependence, cigarettes, uncomplicated: Secondary | ICD-10-CM

## 2019-12-30 DIAGNOSIS — G8929 Other chronic pain: Secondary | ICD-10-CM | POA: Diagnosis not present

## 2019-12-30 DIAGNOSIS — M25511 Pain in right shoulder: Secondary | ICD-10-CM

## 2019-12-30 MED ORDER — HYDROCODONE-ACETAMINOPHEN 5-325 MG PO TABS: ORAL_TABLET | ORAL | 0 refills | Status: DC

## 2019-12-30 NOTE — Progress Notes (Signed)
Referring Provider: Jake Samples, PA* Primary Care Physician:  Jake Samples, PA-C Primary GI Physician: Dr. Gala Romney  Chief Complaint  Patient presents with  . Abdominal Pain    mid abd    HPI:   [name] is a 54 y.o. female presenting today for follow-up.  She was last seen in our office on 06/19/19 for generalized abdominal pain and to discuss scheduling colonoscopy.  She reported history of intermittent abdominal pain occurring every 2-3 weeks lasting a couple days and self resolving.  Typically with [place] 4 stools 1-2 times a day.  Occasional diarrhea that is not associated with abdominal pain.  Symptoms do tend to improve after a BM.  No alarm symptoms.  Queried underlying mild constipation with associated bloating and abdominal discomfort.  Plan for two-view abdominal x-ray to evaluate stool burden and proceed with colonoscopy.  Abdominal x-ray was not completed.  Colonoscopy 11/14/2019: Diverticulosis in the entire examined colon, 6 mm sessile polyp in the ascending colon, otherwise normal exam.  Pathology was benign.  Recommended repeat colonoscopy in 10 years.  Today: Mid upper abdominal pain. Turning, twisting. Last a couple of hours. Waxes and wanes daily. Started 3 months ago. Has been increasing in frequency. At the max, it is 6/10 in severity. Sometimes will feel hungry even when she isn't. Not worsened with eating. Eating improves the pain at times. May improve with a BM. Radiates to the lower abdomen at times.   No nausea or vomiting. Having acid reflux about couple times a week. Has been intermittent in the past separated by several months but has been worsening. No identified food triggers. No regular fired/fatty foods. No soda. No coffee. Drinks water an juice. Reports feeling liquids go down the wrong way 3-4 times a week. Coughing. States her air gets blocked off and she has almost passed out. No foods hanging in her esophagus.    No  unintentional weight loss. No blood in the stool or black stool. Bms daily. Constipated last week. Stools have been more hard this week. No diarrhea.   Had physical earlier this year. Had labs completed.   No NSAIDs. Takes Norco as needed for prior arm injury. Drinks liquor, 4-8 drinks a week. 4oz per drink.   Past Medical History:  Diagnosis Date  . Hypertension   . Vaginal Pap smear, abnormal     Past Surgical History:  Procedure Laterality Date  . COLONOSCOPY WITH PROPOFOL N/A 11/14/2019   Procedure: COLONOSCOPY WITH PROPOFOL;  Surgeon: Daneil Dolin, MD; diverticulosis in the entire examined colon, 6 mm sessile polyp in the ascending colon, otherwise normal exam.  Pathology benign.  Recommended repeat colonoscopy in 10 years.  Marland Kitchen EYE SURGERY    . POLYPECTOMY  11/14/2019   Procedure: POLYPECTOMY;  Surgeon: Daneil Dolin, MD;  Location: [facility];  Service: Endoscopy;;  . TONSILLECTOMY      Current Outpatient Medications  Medication Sig Dispense Refill  . amLODipine (NORVASC) 5 MG tablet Take 5 mg by mouth daily.    . Cholecalciferol (VITAMIN D3 SUPER STRENGTH) 50 MCG (2000 UT) TABS Take 2,000 Units by mouth daily.     Marland Kitchen HYDROcodone-acetaminophen (NORCO/VICODIN) 5-325 MG tablet One tablet every six hours for pain.  Limit 7 days. (Patient taking differently: Take 1 tablet by mouth as needed. One tablet every six hours for pain.  Limit 7 days.) 28 tablet 0  . pantoprazole (PROTONIX) 40 MG tablet Take 1 tablet (40 mg total) by  mouth daily before breakfast. 30 tablet 3   No current facility-administered medications for this visit.    Allergies as of 12/31/2019  . (No Known Allergies)    Family History  Problem Relation Age of Onset  . Hypertension Mother   . Colon cancer Maternal Grandfather        passed in his 70's     Social History   Socioeconomic History  . Marital status: Married    Spouse name: Not on file  . Number of children: Not on file  . Years of  education: Not on file  . Highest education level: Not on file  Occupational History  . Not on file  Tobacco Use  . Smoking status: Current Every Day Smoker    Packs/day: 0.50    Years: 30.00    Pack years: 15.00    Types: Cigarettes  . Smokeless tobacco: Never Used  Substance and Sexual Activity  . Alcohol use: Yes    Comment: occassional  . Drug use: No  . Sexual activity: Yes    Birth control/protection: None  Other Topics Concern  . Not on file  Social History Narrative  . Not on file   Social Determinants of Health   Financial Resource Strain:   . Difficulty of Paying Living Expenses:   Food Insecurity:   . Worried About Charity fundraiser in the Last Year:   . Arboriculturist in the Last Year:   Transportation Needs:   . Film/video editor (Medical):   Marland Kitchen Lack of Transportation (Non-Medical):   Physical Activity:   . Days of Exercise per Week:   . Minutes of Exercise per Session:   Stress:   . Feeling of Stress :   Social Connections:   . Frequency of Communication with Friends and Family:   . Frequency of Social Gatherings with Friends and Family:   . Attends Religious Services:   . Active Member of Clubs or Organizations:   . Attends Archivist Meetings:   Marland Kitchen Marital Status:     Review of Systems: Gen: Denies fever, chills, lightheadedness, dizziness, presyncope, syncope. CV: Occasional chest pain on the right side. Some pain with certain movement. No palpitations.  Discussed following up with PCP on this. Resp: Denies shortness of breath or cough. GI: See HPI Derm: Denies rash Psych: Denies depression. Admits to some anxiety.  Heme: See HPI  Physical Exam: BP 124/88   Pulse 93   Temp (!) 97.1 F (36.2 C) (Temporal)   Ht 5\' 3"  (1.6 m)   Wt 141 lb (64 kg)   LMP 01/23/2018   BMI 24.98 kg/m  General:   Alert and oriented. No distress noted. Pleasant and cooperative.  Head:  Normocephalic and atraumatic. Eyes:  Conjuctiva clear  without scleral icterus. Heart:  S1, S2 present without murmurs appreciated. Lungs:  Clear to auscultation bilaterally. No wheezes, rales, or rhonchi. No distress.  Abdomen:  +BS, soft, nondistended.  Moderate tenderness to palpation in the epigastric area.  Mild tenderness palpation in the lower abdomen. No rebound or guarding. No HSM or masses noted. Msk:  Symmetrical without gross deformities. Normal posture. Extremities:  Without edema. Neurologic:  Alert and  oriented x4 Psych: Normal mood and affect.

## 2019-12-30 NOTE — Progress Notes (Signed)
My shoulder is much better  She has full ROM of the right shoulder. She has some pain at times.  She has returned to work.  NV intact.  Encounter Diagnoses  Name Primary?  . Chronic right shoulder pain Yes  . Cigarette nicotine dependence without complication    I have reviewed the [place] web site prior to prescribing narcotic medicine for this patient.   I will see as needed.  Call if any problem.  Precautions discussed.   Electronically Signed [name], MD 5/4/20211:42 PM

## 2019-12-31 ENCOUNTER — Other Ambulatory Visit: Payer: Self-pay | Admitting: *Deleted

## 2019-12-31 ENCOUNTER — Encounter: Payer: Self-pay | Admitting: Internal Medicine

## 2019-12-31 ENCOUNTER — Ambulatory Visit: Payer: BC Managed Care – PPO | Admitting: Gastroenterology

## 2019-12-31 ENCOUNTER — Encounter: Payer: Self-pay | Admitting: Gastroenterology

## 2019-12-31 VITALS — BP 124/88 | HR 93 | Temp 97.1°F | Ht 63.0 in | Wt 141.0 lb

## 2019-12-31 DIAGNOSIS — R1013 Epigastric pain: Secondary | ICD-10-CM | POA: Diagnosis not present

## 2019-12-31 DIAGNOSIS — K5900 Constipation, unspecified: Secondary | ICD-10-CM | POA: Diagnosis not present

## 2019-12-31 DIAGNOSIS — R1310 Dysphagia, unspecified: Secondary | ICD-10-CM | POA: Insufficient documentation

## 2019-12-31 DIAGNOSIS — K219 Gastro-esophageal reflux disease without esophagitis: Secondary | ICD-10-CM | POA: Insufficient documentation

## 2019-12-31 MED ORDER — PANTOPRAZOLE SODIUM 40 MG PO TBEC: 40.0000 mg | DELAYED_RELEASE_TABLET | Freq: Every day | ORAL | 3 refills | Status: DC

## 2019-12-31 NOTE — Assessment & Plan Note (Addendum)
Patient reports chronic history of occasional GERD symptoms occurring once every few months.  More recently, GERD symptoms have been occurring 1-2 times a week.  She is not eating any fried, fatty, greasy foods regularly.  No soda.  Also with new onset epigastric pain for the last 3 months discussed below.  No NSAIDs. She does drink 4-8 glasses of liquor a week.  Denies bright red blood per rectum or melena.  No unintentional weight loss.  Abdominal exam with moderate tenderness palpation in the epigastric area.  Suspect GERD may be influenced by alcohol.  With new onset of epigastric pain, she may also have gastritis.  Start Protonix 40 mg daily 30 minutes before breakfast. Advised to avoid fried, fatty, greasy, spicy, citrus foods.  Avoid caffeine and carbonated beverages.  Do not eat within 3 hours of laying down. Continue to avoid NSAIDs. Counseled on the importance of decreasing alcohol intake and preferably complete abstinence while we are treating her upper abdominal pain. Follow-up in 3 months.  Call with questions or concerns prior.

## 2019-12-31 NOTE — Patient Instructions (Addendum)
We will refer you to speech-language pathology to have a modified barium swallow to evaluate your difficulty swallowing liquids.  Start Protonix 40 mg daily 30 minutes before breakfast.  I have sent a prescription to your pharmacy.  Follow GERD diet.  Avoid fried, fatty, greasy, spicy, citrus foods.  Avoid carbonated beverages and caffeine.  Do not eat within 3 hours of laying down.  Continue to avoid all NSAIDs.  This includes ibuprofen, Aleve, Advil, Goody powders, naproxen.  Please decrease your alcohol intake.  It would be best for you to completely avoid alcohol while we are working on figuring out the cause of your upper abdominal discomfort.  I suspect you may have gastritis (inflammation in your stomach) that can be caused by alcohol.  For mild constipation, I recommend you start MiraLAX 1 capful (17 g) daily in 8 ounces of water.  You may decrease the frequency if you develop frequent loose stools.  Try adding Benefiber or Metamucil daily.  These are fiber supplements that help with bowel consistency/constipation.  Continue drinking enough water to keep urine pale yellow to clear.  Continue eating plenty of fruits and vegetables daily to maintain adequate daily fiber intake.  We will plan to see you back in 3 months.  Call with questions or concerns prior.  Aliene Altes, PA-C [facility]    Food Choices for Gastroesophageal Reflux Disease, Adult When you have gastroesophageal reflux disease (GERD), the foods you eat and your eating habits are very important. Choosing the right foods can help ease the discomfort of GERD. Consider working with a diet and nutrition specialist (dietitian) to help you make healthy food choices. What general guidelines should I follow?  Eating plan  Choose healthy foods low in fat, such as fruits, vegetables, whole grains, low-fat dairy products, and lean meat, fish, and poultry.  Eat frequent, small meals instead of three large  meals each day. Eat your meals slowly, in a relaxed setting. Avoid bending over or lying down until 2-3 hours after eating.  Limit high-fat foods such as fatty meats or fried foods.  Limit your intake of oils, butter, and shortening to less than 8 teaspoons each day.  Avoid the following: ? Foods that cause symptoms. These may be different for different people. Keep a food diary to keep track of foods that cause symptoms. ? Alcohol. ? Drinking large amounts of liquid with meals. ? Eating meals during the 2-3 hours before bed.  Cook foods using methods other than frying. This may include baking, grilling, or broiling. Lifestyle  Maintain a healthy weight. Ask your health care provider what weight is healthy for you. If you need to lose weight, work with your health care provider to do so safely.  Exercise for at least 30 minutes on 5 or more days each week, or as told by your health care provider.  Avoid wearing clothes that fit tightly around your waist and chest.  Do not use any products that contain nicotine or tobacco, such as cigarettes and e-cigarettes. If you need help quitting, ask your health care provider.  Sleep with the head of your bed raised. Use a wedge under the mattress or blocks under the bed frame to raise the head of the bed. What foods are not recommended? The items listed may not be a complete list. Talk with your dietitian about what dietary choices are best for you. Grains Pastries or quick breads with added fat. [place] toast. Vegetables Deep fried vegetables. [place] fries. Any vegetables  prepared with added fat. Any vegetables that cause symptoms. For some people this may include tomatoes and tomato products, chili peppers, onions and garlic, and horseradish. Fruits Any fruits prepared with added fat. Any fruits that cause symptoms. For some people this may include citrus fruits, such as oranges, grapefruit, pineapple, and lemons. Meats and other protein  foods High-fat meats, such as fatty beef or pork, hot dogs, ribs, ham, sausage, salami and bacon. Fried meat or protein, including fried fish and fried chicken. Nuts and nut butters. Dairy Whole milk and chocolate milk. Sour cream. Cream. Ice cream. Cream cheese. Milk shakes. Beverages Coffee and tea, with or without caffeine. Carbonated beverages. Sodas. Energy drinks. Fruit juice made with acidic fruits (such as orange or grapefruit). Tomato juice. Alcoholic drinks. Fats and oils Butter. Margarine. Shortening. Ghee. Sweets and desserts Chocolate and cocoa. Donuts. Seasoning and other foods Pepper. Peppermint and spearmint. Any condiments, herbs, or seasonings that cause symptoms. For some people, this may include curry, hot sauce, or vinegar-based salad dressings. Summary  When you have gastroesophageal reflux disease (GERD), food and lifestyle choices are very important to help ease the discomfort of GERD.  Eat frequent, small meals instead of three large meals each day. Eat your meals slowly, in a relaxed setting. Avoid bending over or lying down until 2-3 hours after eating.  Limit high-fat foods such as fatty meat or fried foods. This information is not intended to replace advice given to you by your health care provider. Make sure you discuss any questions you have with your health care provider. Document Revised: 12/05/2018 Document Reviewed: 08/15/2016 Elsevier Patient Education  [employer].

## 2019-12-31 NOTE — Assessment & Plan Note (Signed)
Patient describes feeling liquids and saliva go down the wrong way leading to coughing.  States she has almost passed out from this.  Symptoms are occurring frequently, 3-4 times a week.  She denies any solid food dysphagia or feeling foods get hung in her esophagus.  Discussed referral to SLP for MBSS and patient desires to have dysphagia evaluated.  I do not think EGD would be helpful at this point.  We will place referral to SLP for MBSS.

## 2019-12-31 NOTE — Assessment & Plan Note (Addendum)
54 year old reporting 16-month history of epigastric pain that waxes and wanes daily lasting a couple hours at a time. Difficult to identify aggravating/relieving factors. Not necessarily worsened with meals. Reports occasionally feeling hungry when she is not and pain improves sometimes with eating.  May improve with a bowel movement. Typically, she has good bowel movements daily.  She does report being constipated last week and stools have been on the harder side this week.  Also with increasing frequency of GERD. Denies BRBPR, melena, unintentional weight loss, or change in appetite.  No NSAIDs.  Drinks 4-8 glasses of liquor a week.  Abdominal exam with mild tenderness palpation in the lower abdomen and moderate tenderness palpation in the epigastric area.  Reports having labs completed recently with PCP.  Suspect she may have alcohol induced gastritis.  Cannot rule out PUD. Symptoms may also be influenced by constipation.   Start Protonix 40 mg daily 30 minutes before breakfast. Counseled on GERD diet.  Handout provided. Continue to avoid all NSAIDs. Counseled on importance of decreasing alcohol intake and recommended abstinence for now. Start MiraLAX 1 capful (17 g) daily in 8 ounces of water for constipation. Try Benefiber or Metamucil daily. Drink enough water to keep urine pale yellow to clear. Follow-up in 3 months.  Call if questions or concerns prior.

## 2019-12-31 NOTE — Assessment & Plan Note (Addendum)
Patient had mild constipation last week and stools have remained somewhat hard this week.  Typically, she does not struggle with constipation or diarrhea and will have soft, formed stools 1-3 times daily.  She does have 23-month history of epigastric pain which she notes does sometimes improve after a BM. Abdominal exam with moderate tenderness palpation in the epigastric area as well as some mild tenderness palpation in the lower abdomen on exam today.  I suspect constipation may be contributing to her abdominal discomfort.  Colonoscopy up-to-date March 2021 with recommendations to repeat in 10 years.  Start MiraLAX 1 capful (17 g) daily in 8 ounces of water.  Decrease if stools become frequently loose. Add Benefiber or Metamucil daily. Drink enough water to keep urine pale yellow to clear. Further management of epigastric pain addressed above. Follow-up in 3 months.

## 2020-01-02 ENCOUNTER — Ambulatory Visit: Payer: BC Managed Care – PPO | Attending: Internal Medicine

## 2020-01-02 DIAGNOSIS — Z23 Encounter for immunization: Secondary | ICD-10-CM

## 2020-01-02 NOTE — Progress Notes (Signed)
   Covid-19 Vaccination Clinic  Name:  [name]    MRN: [id number] DOB: December 07, 1965  01/02/2020  [name] was observed post Covid-19 immunization for 15 minutes without incident. She was provided with Vaccine Information Sheet and instruction to access the V-Safe system.   [name] was instructed to call 911 with any severe reactions post vaccine: Marland Kitchen Difficulty breathing  . Swelling of face and throat  . A fast heartbeat  . A bad rash all over body  . Dizziness and weakness   Immunizations Administered    Name Date Dose VIS Date Route   Moderna COVID-19 Vaccine 01/02/2020 12:36 PM 0.5 mL 07/2019 Intramuscular   Manufacturer: Moderna   Lot: [id number]   [place][id number]

## 2020-01-04 ENCOUNTER — Telehealth: Payer: Self-pay | Admitting: Gastroenterology

## 2020-01-04 NOTE — Telephone Encounter (Signed)
Received and reviewed labs completed with PCP 11/15/2018.   CBC: WBC 5.5, hemoglobin 13.3, MCV 75 (L), MCH 25.6 (L), platelets 296 CMP: Glucose 97, creatinine 0.81, sodium 137, potassium 4.5, chloride 100, calcium 10.5 (H), albumin 4.6, total bilirubin 0.6, alk phos 75, AST 18, ALT 15 Lipid panel: Cholesterol 208 (H), triglycerides 100, HDL 044, LDL 73  Alicia, please let patient know we requested her most recent labs completed by PCP.  They sent labs completed 11/15/2018.  Patient reporting having labs completed earlier this year when she had a physical.  As it appears it has been more than 1 year since her labs have been updated, would recommend updating CBC, CMP at this time.  Please arrange.

## 2020-01-05 ENCOUNTER — Other Ambulatory Visit: Payer: Self-pay

## 2020-01-05 DIAGNOSIS — R109 Unspecified abdominal pain: Secondary | ICD-10-CM

## 2020-01-05 NOTE — Telephone Encounter (Signed)
Spoke with pt. Pt is aware that her last testing was 1 year ago. New lab orders placed and mailed to pt.

## 2020-02-05 ENCOUNTER — Ambulatory Visit: Payer: BC Managed Care – PPO | Attending: Internal Medicine

## 2020-02-05 DIAGNOSIS — R109 Unspecified abdominal pain: Secondary | ICD-10-CM | POA: Diagnosis not present

## 2020-02-05 DIAGNOSIS — Z23 Encounter for immunization: Secondary | ICD-10-CM

## 2020-02-05 NOTE — Progress Notes (Signed)
   Covid-19 Vaccination Clinic  Name:  [name]    MRN: [id number] DOB: 1966-03-31  02/05/2020  [name] was observed post Covid-19 immunization for 15 minutes without incident. She was provided with Vaccine Information Sheet and instruction to access the V-Safe system.   [name] was instructed to call 911 with any severe reactions post vaccine: Marland Kitchen Difficulty breathing  . Swelling of face and throat  . A fast heartbeat  . A bad rash all over body  . Dizziness and weakness   Immunizations Administered    Name Date Dose VIS Date Route   Moderna COVID-19 Vaccine 02/05/2020 11:54 AM 0.5 mL 07/2019 Intramuscular   Manufacturer: Moderna   Lot: [id number]   [place]: [id number]

## 2020-02-06 LAB — COMPREHENSIVE METABOLIC PANEL
ALT: 15 IU/L (ref 0–32)
AST: 18 IU/L (ref 0–40)
Albumin/Globulin Ratio: 1.5 (ref 1.2–2.2)
Albumin: 4.3 g/dL (ref 3.8–4.9)
Alkaline Phosphatase: 82 IU/L (ref 48–121)
BUN/Creatinine Ratio: 14 (ref 9–23)
BUN: 11 mg/dL (ref 6–24)
Bilirubin Total: 0.6 mg/dL (ref 0.0–1.2)
CO2: 23 mmol/L (ref 20–29)
Calcium: 9.5 mg/dL (ref 8.7–10.2)
Chloride: 107 mmol/L — ABNORMAL HIGH (ref 96–106)
Creatinine, Ser: 0.77 mg/dL (ref 0.57–1.00)
GFR calc Af Amer: 103 mL/min/{1.73_m2} (ref >59)
GFR calc non Af Amer: 89 mL/min/{1.73_m2} (ref >59)
Globulin, Total: 2.8 g/dL (ref 1.5–4.5)
Glucose: 87 mg/dL (ref 65–99)
Potassium: 4.2 mmol/L (ref 3.5–5.2)
Sodium: 144 mmol/L (ref 134–144)
Total Protein: 7.1 g/dL (ref 6.0–8.5)

## 2020-02-06 LAB — CBC WITH DIFFERENTIAL/PLATELET
Basophils Absolute: 0 10*3/uL (ref 0.0–0.2)
Basos: 1 %
EOS (ABSOLUTE): 0.1 10*3/uL (ref 0.0–0.4)
Eos: 2 %
Hematocrit: 41 % (ref 34.0–46.6)
Hemoglobin: 13.4 g/dL (ref 11.1–15.9)
Immature Grans (Abs): 0 10*3/uL (ref 0.0–0.1)
Immature Granulocytes: 0 %
Lymphocytes Absolute: 1.7 10*3/uL (ref 0.7–3.1)
Lymphs: 37 %
MCH: 25.8 pg — ABNORMAL LOW (ref 26.6–33.0)
MCHC: 32.7 g/dL (ref 31.5–35.7)
MCV: 79 fL (ref 79–97)
Monocytes Absolute: 0.3 10*3/uL (ref 0.1–0.9)
Monocytes: 7 %
Neutrophils Absolute: 2.5 10*3/uL (ref 1.4–7.0)
Neutrophils: 53 %
Platelets: 260 10*3/uL (ref 150–450)
RBC: 5.2 x10E6/uL (ref 3.77–5.28)
RDW: 14.1 % (ref 11.7–15.4)
WBC: 4.6 10*3/uL (ref 3.4–10.8)

## 2020-02-24 ENCOUNTER — Telehealth: Payer: Self-pay | Admitting: Orthopaedic Surgery

## 2020-02-24 NOTE — Telephone Encounter (Signed)
[name] called at 2:40 today asking to see Dr. Luna Glasgow for her elbow.  She said this comes from a WC injury several months back.  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 She then said they had told her that it would not be covered under WC.  I told her that we would have to let Dr. Luna Glasgow review her notes.  She is going to get [place] to send [place] their notes and I told her she needs to get any xrays and bring them to [place].  We can pull up information from where she went to the ED in February.  She said she would do this

## 2020-02-26 ENCOUNTER — Emergency Department (HOSPITAL_COMMUNITY)
Admission: EM | Admit: 2020-02-26 | Discharge: 2020-02-26 | Disposition: A | Discharge status: Home / Self Care | Payer: BC Managed Care – PPO | Attending: Emergency Medicine | Admitting: Emergency Medicine

## 2020-02-26 ENCOUNTER — Encounter (HOSPITAL_COMMUNITY): Payer: Self-pay

## 2020-02-26 ENCOUNTER — Emergency Department (HOSPITAL_COMMUNITY): Payer: BC Managed Care – PPO

## 2020-02-26 ENCOUNTER — Other Ambulatory Visit: Payer: Self-pay

## 2020-02-26 ENCOUNTER — Ambulatory Visit: Payer: BC Managed Care – PPO | Admitting: Orthopaedic Surgery

## 2020-02-26 DIAGNOSIS — M7711 Lateral epicondylitis, right elbow: Secondary | ICD-10-CM

## 2020-02-26 DIAGNOSIS — I10 Essential (primary) hypertension: Secondary | ICD-10-CM | POA: Diagnosis not present

## 2020-02-26 DIAGNOSIS — F17210 Nicotine dependence, cigarettes, uncomplicated: Secondary | ICD-10-CM | POA: Diagnosis not present

## 2020-02-26 DIAGNOSIS — S59901A Unspecified injury of right elbow, initial encounter: Secondary | ICD-10-CM | POA: Diagnosis not present

## 2020-02-26 DIAGNOSIS — M25521 Pain in right elbow: Secondary | ICD-10-CM | POA: Diagnosis not present

## 2020-02-26 MED ORDER — NAPROXEN 500 MG PO TABS: 500.0000 mg | ORAL_TABLET | Freq: Two times a day (BID) | ORAL | 0 refills | Status: DC

## 2020-02-26 NOTE — ED Triage Notes (Signed)
Pt presents to ED with complaints of right elbow pain x 4-5 days. NKI

## 2020-02-26 NOTE — Discharge Instructions (Signed)
Your exam suggests that you have an inflamed tendon causing your pain at your elbow.  You may benefit from heat application for 20 minutes several times daily.  In addition, look for a "tennis elbow band" at your drug store which will add compression to this site and can help with pain and healing. Plan to see Dr. Luna Glasgow on Tuesday as planned.

## 2020-02-27 NOTE — ED Provider Notes (Signed)
[facility] EMERGENCY DEPARTMENT Provider Note   CSN: [id number] Arrival date & time: 02/26/20  1359     History Chief Complaint  Patient presents with  . Elbow Pain    [name] is a 54 y.o. female , right handed, presenting with worsening right elbow pain and reduced range of motion since striking the lateral elbow on a metal edge at work last month.  She has used ice and ibuprofen to the site and has tried to rest the elbow without improvement in pain.  She describes frequent repetitive movements with her job and as time goes by, has increasing stiffness of the joint and a frequent "grating" like noise with movement. She has increased pain with forearm supination and pronation and with bending the wrist.  She denies fevers, chills, swelling or redness at the site.    The history is provided by the patient.       Past Medical History:  Diagnosis Date  . Hypertension   . Vaginal Pap smear, abnormal     Patient Active Problem List   Diagnosis Date Noted  . Abdominal pain, epigastric 12/31/2019  . Dysphagia 12/31/2019  . GERD (gastroesophageal reflux disease) 12/31/2019  . Constipation 12/31/2019  . Abdominal pain 06/19/2019  . Preventative health care 06/19/2019  . Chronic right shoulder pain 02/07/2017  . Pain in joint, shoulder region 01/18/2016  . CIN I (cervical intraepithelial neoplasia I) 01/13/2016  . Right shoulder pain 10/05/2015  . Primary osteoarthritis of right foot 05/05/2014  . Ganglion cyst of foot 05/05/2014    Past Surgical History:  Procedure Laterality Date  . COLONOSCOPY WITH PROPOFOL N/A 11/14/2019   Procedure: COLONOSCOPY WITH PROPOFOL;  Surgeon: Daneil Dolin, MD; diverticulosis in the entire examined colon, 6 mm sessile polyp in the ascending colon, otherwise normal exam.  Pathology benign.  Recommended repeat colonoscopy in 10 years.  Marland Kitchen EYE SURGERY    . POLYPECTOMY  11/14/2019   Procedure: POLYPECTOMY;  Surgeon: Daneil Dolin, MD;   Location: [facility];  Service: Endoscopy;;  . TONSILLECTOMY       OB History    Gravida  2   Para  1   Term      Preterm  1   AB  1   Living  1     SAB      TAB      Ectopic      Multiple      Live Births  1           Family History  Problem Relation Age of Onset  . Hypertension Mother   . Colon cancer Maternal Grandfather        passed in his 10's     Social History   Tobacco Use  . Smoking status: Current Every Day Smoker    Packs/day: 0.50    Years: 30.00    Pack years: 15.00    Types: Cigarettes  . Smokeless tobacco: Never Used  Vaping Use  . Vaping Use: Never used  Substance Use Topics  . Alcohol use: Yes    Comment: occassional  . Drug use: No    Home Medications Prior to Admission medications   Medication Sig Start Date End Date Taking? Authorizing Provider  amLODipine (NORVASC) 5 MG tablet Take 5 mg by mouth daily.    [provider]  Cholecalciferol (VITAMIN D3 SUPER STRENGTH) 50 MCG (2000 UT) TABS Take 2,000 Units by mouth daily.     [provider]  HYDROcodone-acetaminophen (NORCO/VICODIN) 5-325 MG tablet One tablet every six hours for pain.  Limit 7 days. Patient taking differently: Take 1 tablet by mouth as needed. One tablet every six hours for pain.  Limit 7 days. 12/30/19   [name], MD  naproxen (NAPROSYN) 500 MG tablet Take 1 tablet (500 mg total) by mouth 2 (two) times daily. 02/26/20   Evalee Jefferson, PA-C  pantoprazole (PROTONIX) 40 MG tablet Take 1 tablet (40 mg total) by mouth daily before breakfast. 12/31/19   Erenest Rasher, PA-C    Allergies    Patient has no known allergies.  Review of Systems   Review of Systems  Constitutional: Negative for fever.  Musculoskeletal: Positive for arthralgias. Negative for joint swelling and myalgias.  Neurological: Negative for weakness and numbness.    Physical Exam Updated Vital Signs BP (!) 131/94   Pulse 77   Temp 98.2 F (36.8 C) (Oral)   Resp 18    Ht 5\' 3"  (1.6 m)   Wt 65.8 kg   LMP 01/23/2018   SpO2 100%   BMI 25.69 kg/m   Physical Exam Constitutional:      Appearance: She is well-developed.  HENT:     Head: Atraumatic.  Cardiovascular:     Comments: Pulses equal bilaterally Musculoskeletal:        General: Tenderness present.     Right elbow: No swelling, deformity or effusion. Tenderness present in lateral epicondyle.     Cervical back: Normal range of motion.     Comments: Pain is worsened with pronation/supination and with wrist extension.  No crepitus appreciated with ROM.   Skin:    General: Skin is warm and dry.     Findings: No erythema.  Neurological:     Mental Status: She is alert.     Sensory: Sensation is intact. No sensory deficit.     Motor: Motor function is intact.     ED Results / Procedures / Treatments   Labs (all labs ordered are listed, but only abnormal results are displayed) Labs Reviewed - No data to display  EKG None  Radiology DG Elbow Complete Right  Result Date: 02/26/2020 CLINICAL DATA:  Pain.  Injury several weeks prior EXAM: RIGHT ELBOW - COMPLETE 3+ VIEW COMPARISON:  October 14, 2019 FINDINGS: Frontal, lateral, and bilateral oblique views were obtained. No fracture or dislocation. No joint effusion. Joint spaces appear normal. No erosive change. IMPRESSION: No fracture or dislocation.  No evident arthropathy. Electronically Signed   By: Lowella Grip III M.D.   On: 02/26/2020 15:00    Procedures Procedures (including critical care time)  Medications Ordered in ED Medications - No data to display  ED Course  I have reviewed the triage vital signs and the nursing notes.  Pertinent labs & imaging results that were available during my care of the patient were reviewed by me and considered in my medical decision making (see chart for details).    MDM Rules/Calculators/A&P                          History and exam suggesting lateral epicondylitis.  She was encouraged  to pick up a tennis elbow band for support and compression. Role of ice/heat discussed.  Naproxen, activities as tolerated. She has appt with Dr. Luna Glasgow next week who is treating her for chronic right shoulder pain, advised f/u with him for further management of this new condition.   Imaging reviewed  and discussed with pt.  Final Clinical Impression(s) / ED Diagnoses Final diagnoses:  Lateral epicondylitis of right elbow    Rx / DC Orders ED Discharge Orders         Ordered    naproxen (NAPROSYN) 500 MG tablet  2 times daily     Discontinue  Reprint     02/26/20 1550           Landis Martins 02/27/20 2204    Sherwood Gambler, MD 02/29/20 2138

## 2020-03-02 ENCOUNTER — Other Ambulatory Visit: Payer: Self-pay

## 2020-03-02 ENCOUNTER — Encounter: Payer: Self-pay | Admitting: Orthopaedic Surgery

## 2020-03-02 ENCOUNTER — Ambulatory Visit: Payer: BC Managed Care – PPO | Admitting: Orthopaedic Surgery

## 2020-03-02 VITALS — BP 128/96 | HR 100 | Ht 63.0 in | Wt 134.0 lb

## 2020-03-02 DIAGNOSIS — F17210 Nicotine dependence, cigarettes, uncomplicated: Secondary | ICD-10-CM

## 2020-03-02 DIAGNOSIS — M7711 Lateral epicondylitis, right elbow: Secondary | ICD-10-CM

## 2020-03-02 MED ORDER — NAPROXEN 500 MG PO TABS: 500.0000 mg | ORAL_TABLET | Freq: Two times a day (BID) | ORAL | 5 refills | Status: DC

## 2020-03-02 MED ORDER — HYDROCODONE-ACETAMINOPHEN 5-325 MG PO TABS: ORAL_TABLET | ORAL | 0 refills | Status: DC

## 2020-03-02 NOTE — Patient Instructions (Signed)
Out of work for the next 3 weeks until next appointment. Dr Luna Glasgow has ordered Occupational therapy @ [facility] in [place]

## 2020-03-02 NOTE — Progress Notes (Signed)
Patient [name], female DOB:Jul 16, 1966, 54 y.o. EQA:[id number]  Chief Complaint  Patient presents with  . Elbow Injury    Rt elbow injured in Feb '21    HPI  [name] is a 54 y.o. female who has developed chronic pain of the right elbow. She went to the ER in February and again last week for this pain.  She had x-rays both times which were negative.  I have independently reviewed and interpreted x-rays of this patient done at another site by another physician or qualified health professional.  I have reviewed the ER notes. She was given Naprosyn which I will continue.   I have explained what "tennis elbow" is and that it will take a long time for it to go away.  I will arrange PT.  I have explained ice massage to her.  I will give pain medicine.   Body mass index is 23.74 kg/m.  ROS  Review of Systems  HENT: Negative for congestion.   Respiratory: Negative for cough and shortness of breath.   Cardiovascular: Negative for chest pain and leg swelling.       History of hypertension.  Endocrine: Positive for cold intolerance.  Musculoskeletal: Positive for arthralgias.  Allergic/Immunologic: Positive for environmental allergies.  All other systems reviewed and are negative.   All other systems reviewed and are negative.  The following is a summary of the past history medically, past history surgically, known current medicines, social history and family history.  This information is gathered electronically by the computer from prior information and documentation.  I review this each visit and have found including this information at this point in the chart is beneficial and informative.    Past Medical History:  Diagnosis Date  . Hypertension   . Vaginal Pap smear, abnormal     Past Surgical History:  Procedure Laterality Date  . COLONOSCOPY WITH PROPOFOL N/A 11/14/2019   Procedure: COLONOSCOPY WITH PROPOFOL;  Surgeon: Daneil Dolin, MD;  diverticulosis in the entire examined colon, 6 mm sessile polyp in the ascending colon, otherwise normal exam.  Pathology benign.  Recommended repeat colonoscopy in 10 years.  Marland Kitchen EYE SURGERY    . POLYPECTOMY  11/14/2019   Procedure: POLYPECTOMY;  Surgeon: Daneil Dolin, MD;  Location: [facility];  Service: Endoscopy;;  . TONSILLECTOMY      Family History  Problem Relation Age of Onset  . Hypertension Mother   . Colon cancer Maternal Grandfather        passed in his 75's     Social History Social History   Tobacco Use  . Smoking status: Current Every Day Smoker    Packs/day: 0.50    Years: 30.00    Pack years: 15.00    Types: Cigarettes  . Smokeless tobacco: Never Used  Vaping Use  . Vaping Use: Never used  Substance Use Topics  . Alcohol use: Yes    Comment: occassional  . Drug use: No    No Known Allergies  Current Outpatient Medications  Medication Sig Dispense Refill  . amLODipine (NORVASC) 5 MG tablet Take 5 mg by mouth daily.    . Cholecalciferol (VITAMIN D3 SUPER STRENGTH) 50 MCG (2000 UT) TABS Take 2,000 Units by mouth daily.     Marland Kitchen HYDROcodone-acetaminophen (NORCO/VICODIN) 5-325 MG tablet One tablet every six hours for pain.  Limit 7 days. 28 tablet 0  . pantoprazole (PROTONIX) 40 MG tablet Take 1 tablet (40 mg total) by mouth daily before  breakfast. 30 tablet 3  . naproxen (NAPROSYN) 500 MG tablet Take 1 tablet (500 mg total) by mouth 2 (two) times daily with a meal. 60 tablet 5   No current facility-administered medications for this visit.     Physical Exam  Blood pressure (!) 128/96, pulse 100, height 5\' 3"  (1.6 m), weight 134 lb (60.8 kg), last menstrual period 01/23/2018.  Constitutional: overall normal hygiene, normal nutrition, well developed, normal grooming, normal body habitus. Assistive device:none  Musculoskeletal: gait and station Limp none, muscle tone and strength are normal, no tremors or atrophy is present.  .  Neurological:  coordination overall normal.  Deep tendon reflex/nerve stretch intact.  Sensation normal.  Cranial nerves II-XII intact.   Skin:   Normal overall no scars, lesions, ulcers or rashes. No psoriasis.  Psychiatric: Alert and oriented x 3.  Recent memory intact, remote memory unclear.  Normal mood and affect. Well groomed.  Good eye contact.  Cardiovascular: overall no swelling, no varicosities, no edema bilaterally, normal temperatures of the legs and arms, no clubbing, cyanosis and good capillary refill.  Lymphatic: palpation is normal.  Right elbow is very tender over the lateral epicondyle with pain to resisted extension, ROM is full but tender.  NV intact.  All other systems reviewed and are negative   The patient has been educated about the nature of the problem(s) and counseled on treatment options.  The patient appeared to understand what I have discussed and is in agreement with it.  Encounter Diagnoses  Name Primary?  . Lateral epicondylitis, right elbow Yes  . Cigarette nicotine dependence without complication     PLAN Call if any problems.  Precautions discussed.  Continue current medications. I will call in [place].  Return to clinic 3 weeks   Begin OT.  I have reviewed the [place] web site prior to prescribing narcotic medicine for this patient.   Electronically Signed [name], MD 7/6/20212:22 PM

## 2020-03-03 ENCOUNTER — Telehealth: Payer: Self-pay | Admitting: Orthopaedic Surgery

## 2020-03-03 NOTE — Telephone Encounter (Signed)
Called patient regarding [employer] forms process, left message to return call. Also faxed patient's work note per signed release authorization form to her employer [employer] (yesterday, 03/02/20, to fax [phone]; patient aware.)

## 2020-03-19 DIAGNOSIS — M7711 Lateral epicondylitis, right elbow: Secondary | ICD-10-CM | POA: Diagnosis not present

## 2020-03-23 ENCOUNTER — Encounter: Payer: Self-pay | Admitting: Orthopaedic Surgery

## 2020-03-23 ENCOUNTER — Ambulatory Visit: Payer: BC Managed Care – PPO | Admitting: Orthopaedic Surgery

## 2020-03-23 ENCOUNTER — Other Ambulatory Visit: Payer: Self-pay

## 2020-03-23 VITALS — BP 137/100 | HR 104 | Ht 63.0 in | Wt 136.0 lb

## 2020-03-23 DIAGNOSIS — F17210 Nicotine dependence, cigarettes, uncomplicated: Secondary | ICD-10-CM | POA: Diagnosis not present

## 2020-03-23 DIAGNOSIS — M7711 Lateral epicondylitis, right elbow: Secondary | ICD-10-CM

## 2020-03-23 MED ORDER — HYDROCODONE-ACETAMINOPHEN 5-325 MG PO TABS: ORAL_TABLET | ORAL | 0 refills | Status: DC

## 2020-03-23 NOTE — Patient Instructions (Signed)

## 2020-03-23 NOTE — Progress Notes (Signed)
Patient PR:[id number] [name], female DOB:16-Jun-1966, 54 y.o. LDJ:[id number]  Chief Complaint  Patient presents with  . Elbow Pain    R/ about the same/just started PT    HPI  [name] is a 54 y.o. female who has right tennis elbow.  She has been to PT. She has been doing the ice massage.  She understands it will take time for this to resolve.  She has pain but it is less.  She is doing her exercises.   Body mass index is 24.09 kg/m.  ROS  Review of Systems  HENT: Negative for congestion.   Respiratory: Negative for cough and shortness of breath.   Cardiovascular: Negative for chest pain and leg swelling.       History of hypertension.  Endocrine: Positive for cold intolerance.  Musculoskeletal: Positive for arthralgias.  Allergic/Immunologic: Positive for environmental allergies.  All other systems reviewed and are negative.   All other systems reviewed and are negative.  The following is a summary of the past history medically, past history surgically, known current medicines, social history and family history.  This information is gathered electronically by the computer from prior information and documentation.  I review this each visit and have found including this information at this point in the chart is beneficial and informative.    Past Medical History:  Diagnosis Date  . Hypertension   . Vaginal Pap smear, abnormal     Past Surgical History:  Procedure Laterality Date  . COLONOSCOPY WITH PROPOFOL N/A 11/14/2019   Procedure: COLONOSCOPY WITH PROPOFOL;  Surgeon: Daneil Dolin, MD; diverticulosis in the entire examined colon, 6 mm sessile polyp in the ascending colon, otherwise normal exam.  Pathology benign.  Recommended repeat colonoscopy in 10 years.  Marland Kitchen EYE SURGERY    . POLYPECTOMY  11/14/2019   Procedure: POLYPECTOMY;  Surgeon: Daneil Dolin, MD;  Location: [facility];  Service: Endoscopy;;  . TONSILLECTOMY      Family History  Problem  Relation Age of Onset  . Hypertension Mother   . Colon cancer Maternal Grandfather        passed in his 56's     Social History Social History   Tobacco Use  . Smoking status: Current Every Day Smoker    Packs/day: 0.50    Years: 30.00    Pack years: 15.00    Types: Cigarettes  . Smokeless tobacco: Never Used  Vaping Use  . Vaping Use: Never used  Substance Use Topics  . Alcohol use: Yes    Comment: occassional  . Drug use: No    No Known Allergies  Current Outpatient Medications  Medication Sig Dispense Refill  . amLODipine (NORVASC) 5 MG tablet Take 5 mg by mouth daily.    . Cholecalciferol (VITAMIN D3 SUPER STRENGTH) 50 MCG (2000 UT) TABS Take 2,000 Units by mouth daily.     Marland Kitchen HYDROcodone-acetaminophen (NORCO/VICODIN) 5-325 MG tablet One tablet every six hours for pain.  Limit 7 days. 28 tablet 0  . naproxen (NAPROSYN) 500 MG tablet Take 1 tablet (500 mg total) by mouth 2 (two) times daily with a meal. 60 tablet 5  . pantoprazole (PROTONIX) 40 MG tablet Take 1 tablet (40 mg total) by mouth daily before breakfast. 30 tablet 3   No current facility-administered medications for this visit.     Physical Exam  Blood pressure (!) 137/100, pulse 104, height 5\' 3"  (1.6 m), weight 136 lb (61.7 kg), last menstrual period 01/23/2018.  Constitutional:  overall normal hygiene, normal nutrition, well developed, normal grooming, normal body habitus. Assistive device:none  Musculoskeletal: gait and station Limp none, muscle tone and strength are normal, no tremors or atrophy is present.  .  Neurological: coordination overall normal.  Deep tendon reflex/nerve stretch intact.  Sensation normal.  Cranial nerves II-XII intact.   Skin:   Normal overall no scars, lesions, ulcers or rashes. No psoriasis.  Psychiatric: Alert and oriented x 3.  Recent memory intact, remote memory unclear.  Normal mood and affect. Well groomed.  Good eye contact.  Cardiovascular: overall no swelling, no  varicosities, no edema bilaterally, normal temperatures of the legs and arms, no clubbing, cyanosis and good capillary refill.  Lymphatic: palpation is normal.  She is tender over the right lateral epicondyle.  She has no swelling or redness.  NV intact. Pain to resisted dorsal extension of the right wrist.  All other systems reviewed and are negative   The patient has been educated about the nature of the problem(s) and counseled on treatment options.  The patient appeared to understand what I have discussed and is in agreement with it.  Encounter Diagnoses  Name Primary?  . Lateral epicondylitis, right elbow Yes  . Cigarette nicotine dependence without complication     PLAN Call if any problems.  Precautions discussed.  Continue current medications.   Return to clinic 6 weeks   Continue PT.  Electronically Signed [name], MD 7/27/20211:51 PM

## 2020-03-25 DIAGNOSIS — M7711 Lateral epicondylitis, right elbow: Secondary | ICD-10-CM | POA: Diagnosis not present

## 2020-03-30 DIAGNOSIS — M7711 Lateral epicondylitis, right elbow: Secondary | ICD-10-CM | POA: Diagnosis not present

## 2020-04-01 DIAGNOSIS — M7711 Lateral epicondylitis, right elbow: Secondary | ICD-10-CM | POA: Diagnosis not present

## 2020-04-02 ENCOUNTER — Ambulatory Visit: Payer: BC Managed Care – PPO | Admitting: Gastroenterology

## 2020-04-06 DIAGNOSIS — M7711 Lateral epicondylitis, right elbow: Secondary | ICD-10-CM | POA: Diagnosis not present

## 2020-04-08 DIAGNOSIS — M7711 Lateral epicondylitis, right elbow: Secondary | ICD-10-CM | POA: Diagnosis not present

## 2020-04-09 DIAGNOSIS — H35463 Secondary vitreoretinal degeneration, bilateral: Secondary | ICD-10-CM | POA: Diagnosis not present

## 2020-04-13 DIAGNOSIS — M7711 Lateral epicondylitis, right elbow: Secondary | ICD-10-CM | POA: Diagnosis not present

## 2020-04-15 DIAGNOSIS — M7711 Lateral epicondylitis, right elbow: Secondary | ICD-10-CM | POA: Diagnosis not present

## 2020-04-20 DIAGNOSIS — M7711 Lateral epicondylitis, right elbow: Secondary | ICD-10-CM | POA: Diagnosis not present

## 2020-04-21 DIAGNOSIS — M7711 Lateral epicondylitis, right elbow: Secondary | ICD-10-CM | POA: Diagnosis not present

## 2020-04-27 DIAGNOSIS — M7711 Lateral epicondylitis, right elbow: Secondary | ICD-10-CM | POA: Diagnosis not present

## 2020-04-29 DIAGNOSIS — M7711 Lateral epicondylitis, right elbow: Secondary | ICD-10-CM | POA: Diagnosis not present

## 2020-05-04 ENCOUNTER — Encounter: Payer: Self-pay | Admitting: Orthopaedic Surgery

## 2020-05-04 ENCOUNTER — Ambulatory Visit: Payer: BC Managed Care – PPO | Admitting: Orthopaedic Surgery

## 2020-05-04 ENCOUNTER — Other Ambulatory Visit: Payer: Self-pay

## 2020-05-04 VITALS — BP 154/105 | HR 89 | Ht 63.0 in | Wt 136.0 lb

## 2020-05-04 DIAGNOSIS — F17210 Nicotine dependence, cigarettes, uncomplicated: Secondary | ICD-10-CM | POA: Diagnosis not present

## 2020-05-04 DIAGNOSIS — M7711 Lateral epicondylitis, right elbow: Secondary | ICD-10-CM

## 2020-05-04 NOTE — Patient Instructions (Signed)
Ok to return to work today

## 2020-05-04 NOTE — Progress Notes (Signed)
I am much better  Her right tennis elbow is much improved. She has been to therapy.  She has been released. I have reviewed the notes.  She has full ROM and no pain of the right elbow.  Encounter Diagnoses  Name Primary?  . Lateral epicondylitis, right elbow Yes  . Cigarette nicotine dependence without complication    Discharge.  Continue her exercises at home.  Call if any problem.  Precautions discussed.   Electronically Signed [name], MD 9/7/20212:19 PM

## 2020-05-10 ENCOUNTER — Other Ambulatory Visit: Payer: Self-pay | Admitting: Gastroenterology

## 2020-05-10 DIAGNOSIS — R1013 Epigastric pain: Secondary | ICD-10-CM

## 2020-05-10 DIAGNOSIS — K219 Gastro-esophageal reflux disease without esophagitis: Secondary | ICD-10-CM

## 2020-05-30 NOTE — Progress Notes (Signed)
Referring Provider: Jake Samples, PA* Primary Care Physician:  Jake Samples, PA-C Primary GI Physician: Dr. Gala Romney  Chief Complaint  Patient presents with  . Gastroesophageal Reflux    doing fine    HPI:   [name] is a 54 y.o. female presenting today for follow-up of GERD, constipation, epigastric abdominal pain, and dysphagia.  Last seen in our office 12/31/2019 for the same.  She reported 53-month history of daily mid to upper abdominal pain lasting couple hours at a time.  Eating improved pain at times.  No N/V.  GERD symptoms a couple times a week which had increased.  Reported liquids going down the wrong way 3-4 times a week inducing coughing.  Reports she had almost passed out.  No food getting stuck in her esophagus.  Denied NSAIDs.  Was drinking 4-8 mixed drinks per week.  She had moderate tenderness to palpation in the epigastric area on exam.  Plan included starting Protonix 40 mg daily, following a GERD diet, limiting alcohol and avoiding NSAIDs, referred to  SLP for MBSS to evaluate swallowing, add MiraLAX daily, daily fiber supplement, increase water consumption, and follow-up in 3 months.  Patient reported having labs completed with PCP.  We will request of his results and labs were completed in March 2020.  We went ahead and updated CBC and CMP which was completed 02/05/2020 with no significant abnormalities.  Patient's referral to SLP was ultimately closed as they were unable to contact the patient.  Today:  GERD: Well controlled on Protonix 40 mg daily.  No breakthrough symptoms.    Epigastric abdominal pain: Resolved. Doesn't take Naproxen routinely, last was 2 months ago.  No other NSAIDs.  Dysphagia: Continues feeling foods or liquids go down the wrong way causing coughing/choking. Maybe twice a week. No foods getting hung in esophagus.   Constipation: Resolved.  Increase salads and water consumption.  BMs daily. No blood in the stool or black  stool.   Past Medical History:  Diagnosis Date  . GERD (gastroesophageal reflux disease)   . Hypertension   . Vaginal Pap smear, abnormal     Past Surgical History:  Procedure Laterality Date  . COLONOSCOPY WITH PROPOFOL N/A 11/14/2019   Procedure: COLONOSCOPY WITH PROPOFOL;  Surgeon: Daneil Dolin, MD; diverticulosis in the entire examined colon, 6 mm sessile polyp in the ascending colon, otherwise normal exam.  Pathology benign.  Recommended repeat colonoscopy in 10 years.  Marland Kitchen EYE SURGERY    . POLYPECTOMY  11/14/2019   Procedure: POLYPECTOMY;  Surgeon: Daneil Dolin, MD;  Location: [facility];  Service: Endoscopy;;  . TONSILLECTOMY      Current Outpatient Medications  Medication Sig Dispense Refill  . amLODipine (NORVASC) 5 MG tablet Take 5 mg by mouth daily.    . Cholecalciferol (VITAMIN D3 SUPER STRENGTH) 50 MCG (2000 UT) TABS Take 2,000 Units by mouth daily.     Marland Kitchen HYDROcodone-acetaminophen (NORCO/VICODIN) 5-325 MG tablet One tablet every six hours for pain.  Limit 7 days. 28 tablet 0  . naproxen (NAPROSYN) 500 MG tablet Take 1 tablet (500 mg total) by mouth 2 (two) times daily with a meal. 60 tablet 5  . pantoprazole (PROTONIX) 40 MG tablet TAKE 1 TABLET(40 MG) BY MOUTH DAILY BEFORE BREAKFAST 90 tablet 1   No current facility-administered medications for this visit.    Allergies as of 05/31/2020  . (No Known Allergies)    Family History  Problem Relation Age of Onset  .  Hypertension Mother   . Prostate cancer Maternal Grandfather        passed in his 31s.     Social History   Socioeconomic History  . Marital status: Married    Spouse name: Not on file  . Number of children: Not on file  . Years of education: Not on file  . Highest education level: Not on file  Occupational History  . Not on file  Tobacco Use  . Smoking status: Current Every Day Smoker    Packs/day: 0.50    Years: 30.00    Pack years: 15.00    Types: Cigarettes  . Smokeless tobacco:  Never Used  Vaping Use  . Vaping Use: Never used  Substance and Sexual Activity  . Alcohol use: Yes    Comment: occassional  . Drug use: No  . Sexual activity: Yes    Birth control/protection: None  Other Topics Concern  . Not on file  Social History Narrative  . Not on file   Social Determinants of Health   Financial Resource Strain:   . Difficulty of Paying Living Expenses: Not on file  Food Insecurity:   . Worried About Charity fundraiser in the Last Year: Not on file  . Ran Out of Food in the Last Year: Not on file  Transportation Needs:   . Lack of Transportation (Medical): Not on file  . Lack of Transportation (Non-Medical): Not on file  Physical Activity:   . Days of Exercise per Week: Not on file  . Minutes of Exercise per Session: Not on file  Stress:   . Feeling of Stress : Not on file  Social Connections:   . Frequency of Communication with Friends and Family: Not on file  . Frequency of Social Gatherings with Friends and Family: Not on file  . Attends Religious Services: Not on file  . Active Member of Clubs or Organizations: Not on file  . Attends Archivist Meetings: Not on file  . Marital Status: Not on file    Review of Systems: Gen: Denies fever, chills, cold or flulike symptoms, lightheadedness, dizziness, presyncope, syncope. CV: Intermittent chest pain related to picking up items or certain arm movements.  No heart palpitations.  No exertional symptoms. Resp: Denies dyspnea or cough.Marland Kitchen GI: See HPI  Heme: See HPI  Physical Exam: BP (!) 141/92   Pulse 87   Temp (!) 97.1 F (36.2 C) (Oral)   Ht 5\' 3"  (1.6 m)   Wt 142 lb 3.2 oz (64.5 kg)   LMP 01/23/2018   BMI 25.19 kg/m  General:   Alert and oriented. No distress noted. Pleasant and cooperative.  Head:  Normocephalic and atraumatic. Eyes:  Conjuctiva clear without scleral icterus. Heart:  S1, S2 present without murmurs appreciated. Lungs:  Clear to auscultation bilaterally. No  wheezes, rales, or rhonchi. No distress.  Abdomen:  +BS, soft, non-tender and non-distended. No rebound or guarding. No HSM or masses noted. Msk:  Symmetrical without gross deformities. Normal posture. Extremities:  Without edema. Neurologic:  Alert and  oriented x4 Psych:  Normal mood and affect.

## 2020-05-31 ENCOUNTER — Ambulatory Visit (INDEPENDENT_AMBULATORY_CARE_PROVIDER_SITE_OTHER): Payer: BC Managed Care – PPO | Admitting: Gastroenterology

## 2020-05-31 ENCOUNTER — Encounter: Payer: Self-pay | Admitting: Gastroenterology

## 2020-05-31 ENCOUNTER — Other Ambulatory Visit: Payer: Self-pay | Admitting: *Deleted

## 2020-05-31 ENCOUNTER — Other Ambulatory Visit: Payer: Self-pay

## 2020-05-31 VITALS — BP 141/92 | HR 87 | Temp 97.1°F | Ht 63.0 in | Wt 142.2 lb

## 2020-05-31 DIAGNOSIS — R1310 Dysphagia, unspecified: Secondary | ICD-10-CM

## 2020-05-31 DIAGNOSIS — R1013 Epigastric pain: Secondary | ICD-10-CM | POA: Diagnosis not present

## 2020-05-31 DIAGNOSIS — K5900 Constipation, unspecified: Secondary | ICD-10-CM

## 2020-05-31 DIAGNOSIS — K219 Gastro-esophageal reflux disease without esophagitis: Secondary | ICD-10-CM | POA: Diagnosis not present

## 2020-05-31 DIAGNOSIS — R079 Chest pain, unspecified: Secondary | ICD-10-CM | POA: Insufficient documentation

## 2020-05-31 NOTE — Patient Instructions (Signed)
Continue taking Protonix 40 mg daily 30 minutes before breakfast.  Continue limit all NSAIDs including ibuprofen, Aleve, Advil, Goody powders, naproxen, and anything that says "NSAID" on the package.  We will place referral to speech-language pathologist again for further evaluation of sensation of foods going down the wrong way when swallowing.    We will plan to see back in 6 months.  Do not hesitate to call if you have questions or concerns prior.  It was great seeing you today! I am glad you are feeling better!  Aliene Altes, PA-C [facility]

## 2020-05-31 NOTE — Assessment & Plan Note (Signed)
Well-controlled on Protonix 40 mg daily.  Advise she continue her current medications and follow-up in 6 months.

## 2020-05-31 NOTE — Assessment & Plan Note (Signed)
Resolved with Protonix 40 mg daily.  No alarm symptoms.  Suspect symptoms may have been influenced by GERD and possible gastritis.  Advise she continue her current medications, avoid NSAIDs, and follow-up in 6 months.

## 2020-05-31 NOTE — Assessment & Plan Note (Addendum)
Patient reports intermittent chest discomfort when picking up items or certain arm movements.  Denies exertional symptoms.  No heart palpitations or shortness of breath.  Reports she has an upcoming physical with PCP before the end of the year.  I have advised that she discuss the symptoms with her PCP.

## 2020-05-31 NOTE — Assessment & Plan Note (Signed)
Self resolved with increasing water and fiber intake.  Advise she continue to monitor for return of symptoms.  We will follow up in 6 months.

## 2020-05-31 NOTE — Assessment & Plan Note (Signed)
Patient continues to report sensation of foods or liquids going down the wrong way leading to coughing/choking spells.  Denies food getting hung in her esophagus.  Symptoms occur about twice a week.  Previously referred her to [facility] for MBSS, but referral was closed as they could not reach patient.  Will place referral again today.  I advised patient to keep a close check on her phone for incoming calls and voicemails.  We will follow up with her in 6 months.

## 2020-07-05 DIAGNOSIS — J019 Acute sinusitis, unspecified: Secondary | ICD-10-CM | POA: Diagnosis not present

## 2020-07-05 DIAGNOSIS — J069 Acute upper respiratory infection, unspecified: Secondary | ICD-10-CM | POA: Diagnosis not present

## 2020-11-09 DIAGNOSIS — I10 Essential (primary) hypertension: Secondary | ICD-10-CM | POA: Diagnosis not present

## 2020-11-09 DIAGNOSIS — M67441 Ganglion, right hand: Secondary | ICD-10-CM | POA: Diagnosis not present

## 2020-11-09 DIAGNOSIS — Z1331 Encounter for screening for depression: Secondary | ICD-10-CM | POA: Diagnosis not present

## 2020-11-09 DIAGNOSIS — Z6825 Body mass index (BMI) 25.0-25.9, adult: Secondary | ICD-10-CM | POA: Diagnosis not present

## 2020-11-09 DIAGNOSIS — K219 Gastro-esophageal reflux disease without esophagitis: Secondary | ICD-10-CM | POA: Diagnosis not present

## 2020-11-09 DIAGNOSIS — Z23 Encounter for immunization: Secondary | ICD-10-CM | POA: Diagnosis not present

## 2020-11-27 NOTE — Progress Notes (Deleted)
Referring Provider: Jake Samples, PA* Primary Care Physician:  Jake Samples, PA-C Primary GI Physician: Dr. Gala Romney  No chief complaint on file.   HPI:   [name] is a 55 y.o. female presenting today for follow-up.  History of GERD, epigastric pain, constipation, and dysphagia.  Colonoscopy up-to-date, due for repeat in 2031.  Last seen in our office 05/31/2020.  GERD and epigastric pain much improved/resolved with Protonix 40 mg daily.  She continued to feel foods or liquids would go down the wrong way causing coughing/choking about twice a week.  Denied feeling foods getting hung in her esophagus.  We had previously referred her to [facility] for MBSS, but referral had been closed as they were not able to contact patient.  Constipation had also resolved with increasing salads and water intake.  No alarm symptoms.  Referral was again placed to [facility].  She was advised to continue Protonix 40 mg daily and follow-up in 6 months.  Multiple attempts were made by [facility], but they were unable to reach patient.  Referral was again closed.  Today:  GERD:   Constipation:   Dysphagia:    Past Medical History:  Diagnosis Date  . GERD (gastroesophageal reflux disease)   . Hypertension   . Vaginal Pap smear, abnormal     Past Surgical History:  Procedure Laterality Date  . COLONOSCOPY WITH PROPOFOL N/A 11/14/2019   Procedure: COLONOSCOPY WITH PROPOFOL;  Surgeon: Daneil Dolin, MD; diverticulosis in the entire examined colon, 6 mm sessile polyp in the ascending colon, otherwise normal exam.  Pathology benign.  Recommended repeat colonoscopy in 10 years.  Marland Kitchen EYE SURGERY    . POLYPECTOMY  11/14/2019   Procedure: POLYPECTOMY;  Surgeon: Daneil Dolin, MD;  Location: [facility];  Service: Endoscopy;;  . TONSILLECTOMY      Current Outpatient Medications  Medication Sig Dispense Refill  . amLODipine (NORVASC) 5 MG tablet Take 5 mg by mouth daily.    . Cholecalciferol  (VITAMIN D3 SUPER STRENGTH) 50 MCG (2000 UT) TABS Take 2,000 Units by mouth daily.     Marland Kitchen HYDROcodone-acetaminophen (NORCO/VICODIN) 5-325 MG tablet One tablet every six hours for pain.  Limit 7 days. 28 tablet 0  . naproxen (NAPROSYN) 500 MG tablet Take 1 tablet (500 mg total) by mouth 2 (two) times daily with a meal. 60 tablet 5  . pantoprazole (PROTONIX) 40 MG tablet TAKE 1 TABLET(40 MG) BY MOUTH DAILY BEFORE BREAKFAST 90 tablet 1   No current facility-administered medications for this visit.    Allergies as of 11/29/2020  . (No Known Allergies)    Family History  Problem Relation Age of Onset  . Hypertension Mother   . Prostate cancer Maternal Grandfather        passed in his 53s.     Social History   Socioeconomic History  . Marital status: Married    Spouse name: Not on file  . Number of children: Not on file  . Years of education: Not on file  . Highest education level: Not on file  Occupational History  . Not on file  Tobacco Use  . Smoking status: Current Every Day Smoker    Packs/day: 0.50    Years: 30.00    Pack years: 15.00    Types: Cigarettes  . Smokeless tobacco: Never Used  Vaping Use  . Vaping Use: Never used  Substance and Sexual Activity  . Alcohol use: Yes    Comment: occassional  .  Drug use: No  . Sexual activity: Yes    Birth control/protection: None  Other Topics Concern  . Not on file  Social History Narrative  . Not on file   Social Determinants of Health   Financial Resource Strain: Not on file  Food Insecurity: Not on file  Transportation Needs: Not on file  Physical Activity: Not on file  Stress: Not on file  Social Connections: Not on file    Review of Systems: Gen: Denies fever, chills, anorexia. Denies fatigue, weakness, weight loss.  CV: Denies chest pain, palpitations, syncope, peripheral edema, and claudication. Resp: Denies dyspnea at rest, cough, wheezing, coughing up blood, and pleurisy. GI: Denies vomiting blood,  jaundice, and fecal incontinence.   Denies dysphagia or odynophagia. Derm: Denies rash, itching, dry skin Psych: Denies depression, anxiety, memory loss, confusion. No homicidal or suicidal ideation.  Heme: Denies bruising, bleeding, and enlarged lymph nodes.  Physical Exam: LMP 01/23/2018  General:   Alert and oriented. No distress noted. Pleasant and cooperative.  Head:  Normocephalic and atraumatic. Eyes:  Conjuctiva clear without scleral icterus. Mouth:  Oral mucosa pink and moist. Good dentition. No lesions. Heart:  S1, S2 present without murmurs appreciated. Lungs:  Clear to auscultation bilaterally. No wheezes, rales, or rhonchi. No distress.  Abdomen:  +BS, soft, non-tender and non-distended. No rebound or guarding. No HSM or masses noted. Msk:  Symmetrical without gross deformities. Normal posture. Extremities:  Without edema. Neurologic:  Alert and  oriented x4 Psych:  Alert and cooperative. Normal mood and affect.

## 2020-11-29 ENCOUNTER — Encounter: Payer: Self-pay | Admitting: Internal Medicine

## 2020-11-29 ENCOUNTER — Ambulatory Visit: Payer: BC Managed Care – PPO | Admitting: Gastroenterology

## 2020-12-03 DIAGNOSIS — Z0001 Encounter for general adult medical examination with abnormal findings: Secondary | ICD-10-CM | POA: Diagnosis not present

## 2020-12-03 DIAGNOSIS — K219 Gastro-esophageal reflux disease without esophagitis: Secondary | ICD-10-CM | POA: Diagnosis not present

## 2020-12-03 DIAGNOSIS — I10 Essential (primary) hypertension: Secondary | ICD-10-CM | POA: Diagnosis not present

## 2020-12-03 DIAGNOSIS — Z6826 Body mass index (BMI) 26.0-26.9, adult: Secondary | ICD-10-CM | POA: Diagnosis not present

## 2020-12-03 DIAGNOSIS — F17210 Nicotine dependence, cigarettes, uncomplicated: Secondary | ICD-10-CM | POA: Diagnosis not present

## 2020-12-03 DIAGNOSIS — E663 Overweight: Secondary | ICD-10-CM | POA: Diagnosis not present

## 2021-02-22 DIAGNOSIS — H1033 Unspecified acute conjunctivitis, bilateral: Secondary | ICD-10-CM | POA: Diagnosis not present

## 2021-03-23 ENCOUNTER — Encounter: Payer: Self-pay | Admitting: Orthopedic Surgery

## 2021-03-23 DIAGNOSIS — M67432 Ganglion, left wrist: Secondary | ICD-10-CM | POA: Diagnosis not present

## 2021-03-23 DIAGNOSIS — K219 Gastro-esophageal reflux disease without esophagitis: Secondary | ICD-10-CM | POA: Diagnosis not present

## 2021-03-23 DIAGNOSIS — Z6825 Body mass index (BMI) 25.0-25.9, adult: Secondary | ICD-10-CM | POA: Diagnosis not present

## 2021-03-23 DIAGNOSIS — E663 Overweight: Secondary | ICD-10-CM | POA: Diagnosis not present

## 2021-03-23 DIAGNOSIS — I10 Essential (primary) hypertension: Secondary | ICD-10-CM | POA: Diagnosis not present

## 2021-03-29 DIAGNOSIS — Z6826 Body mass index (BMI) 26.0-26.9, adult: Secondary | ICD-10-CM | POA: Diagnosis not present

## 2021-03-29 DIAGNOSIS — E559 Vitamin D deficiency, unspecified: Secondary | ICD-10-CM | POA: Diagnosis not present

## 2021-03-29 DIAGNOSIS — E663 Overweight: Secondary | ICD-10-CM | POA: Diagnosis not present

## 2021-03-29 DIAGNOSIS — M67441 Ganglion, right hand: Secondary | ICD-10-CM | POA: Diagnosis not present

## 2021-04-07 ENCOUNTER — Ambulatory Visit: Payer: BC Managed Care – PPO | Admitting: Orthopedic Surgery

## 2021-04-07 ENCOUNTER — Other Ambulatory Visit: Payer: Self-pay

## 2021-04-07 ENCOUNTER — Encounter: Payer: Self-pay | Admitting: Orthopedic Surgery

## 2021-04-07 ENCOUNTER — Ambulatory Visit: Payer: BC Managed Care – PPO

## 2021-04-07 VITALS — BP 123/89 | HR 93 | Ht 63.0 in | Wt 143.0 lb

## 2021-04-07 DIAGNOSIS — M6740 Ganglion, unspecified site: Secondary | ICD-10-CM

## 2021-04-07 NOTE — Patient Instructions (Addendum)
Your surgery will be at [facility] by Dr Aline Brochure  The hospital will contact you with a preoperative appointment to discuss Anesthesia. Please bring your medications with you for the appointment. They will tell you the arrival time and medication instructions when you have your preoperative evaluation. Do not wear nail polish the day of your surgery and if you take Phentermine you need to stop this medication ONE WEEK prior to your surgery.   Out of work, now through 4 weeks after surgery. Wants FMLA information

## 2021-04-07 NOTE — Progress Notes (Signed)
Established patient new problem   Chief Complaint  Patient presents with   Wrist Pain    Pt with nodule on left wrist for several months. Pt states as it's gotten bigger it's become more painful. Pt states she can still lift things but can't lift anything heavy.   Medication Refill    Hydrocodone     25 female mass dorsum left wrist pain with extension getting bigger no trauma, history of ganglion cyst foot   Body mass index is 25.33 kg/m.  BP 123/89   Pulse 93   Ht '5\' 3"'$  (1.6 m)   Wt 143 lb (64.9 kg)   LMP 01/23/2018   BMI 25.33 kg/m   Past Medical History:  Diagnosis Date   GERD (gastroesophageal reflux disease)    Hypertension    Vaginal Pap smear, abnormal     Physical Exam  Constitutional: Development normal, nutrition normal, body habitus normal thin Mental status oriented x3 mood and affect no depression Cardiovascular pulses and temperature normal   Gait noncontributory  Musculoskeletal:  Inspection: Left wrist dorsal ganglion type mass seems to be lobulated Range of motion: Normal with pain at terminal extension and flexion Stability tests were normal Muscle strength and tone: Normal  Skin warm dry and intact no erythema  Neurological sensation normal  Assessment and plan:  Encounter Diagnosis  Name Primary?   Ganglion cyst Yes   X-ray negative x-ray  Patient has opted for excision when given the choice of no other treatment aspiration or excision  15% recurrence rate patient willing to take that risk  Patient will be booked for an excision left wrist ganglion

## 2021-04-19 ENCOUNTER — Telehealth: Payer: Self-pay | Admitting: Radiology

## 2021-04-19 NOTE — Telephone Encounter (Signed)
Called patient to RS surgery to Sept 9th, she is agreeable to this day.

## 2021-04-21 ENCOUNTER — Encounter (HOSPITAL_COMMUNITY): Admission: RE | Admit: 2021-04-21 | Payer: BC Managed Care – PPO | Source: Ambulatory Visit

## 2021-04-27 ENCOUNTER — Telehealth: Payer: Self-pay | Admitting: Orthopedic Surgery

## 2021-04-27 ENCOUNTER — Encounter: Payer: Self-pay | Admitting: Orthopedic Surgery

## 2021-04-27 NOTE — Telephone Encounter (Signed)
Faxed updated work note per patient request to her employer [employer] / fax [phone], Sharee Pimple Apple and to her short-term disability Agricultural consultant / fax [phone]

## 2021-04-27 NOTE — Patient Instructions (Addendum)
MARCEY KOSANKE  04/27/2021     '@PREFPERIOPPHARMACY'$ @   Your procedure is scheduled on  05/06/2021.   Report to [facility] at  0700  A.M.   Call this number if you have problems the morning of surgery:  [phone]   Remember:  Do not eat or drink after midnight.      Take these medicines the morning of surgery with A SIP OF WATER                                         Protonix.      Do not wear jewelry, make-up or nail polish.  Do not wear lotions, powders, or perfumes, or deodorant.  Do not shave 48 hours prior to surgery.  Men may shave face and neck.  Do not bring valuables to the hospital.  [facility] is not responsible for any belongings or valuables.  Contacts, dentures or bridgework may not be worn into surgery.  Leave your suitcase in the car.  After surgery it may be brought to your room.  For patients admitted to the hospital, discharge time will be determined by your treatment team.  Patients discharged the day of surgery will not be allowed to drive home and must have someone with them for 24 hours.    Special instructions:   DO NOT smoke tobacco or vape for 24 hours before your procedure.  Please read over the following fact sheets that you were given. Coughing and Deep Breathing, Surgical Site Infection Prevention, Anesthesia Post-op Instructions, and Care and Recovery After Surgery      Ganglion Cyst Removal, Care After This sheet gives you information about how to care for yourself after your procedure. Your health care provider may also give you more specific instructions. If you have problems or questions, contact your health care provider. What can I expect after the procedure? After the procedure, it is common to have: Soreness. Swelling. Stiffness. A splint or brace. Follow these instructions at home: Medicines Take over-the-counter and prescription medicines only as told by your health care provider. Ask your health care  provider if the medicine prescribed to you requires you to avoid driving or using machinery. Incision care  Follow instructions from your health care provider about how to take care of your incision or incisions. Make sure you: Wash your hands with soap and water for at least 20 seconds before and after you change your bandage (dressing). If soap and water are not available, use hand sanitizer. Change and remove your dressing as told by your health care provider. Leave stitches (sutures), skin glue, or adhesive strips in place. These skin closures may need to stay in place for 2 weeks or longer. If adhesive strip edges start to loosen and curl up, you may trim the loose edges. Do not remove adhesive strips completely unless your health care provider tells you to do that. Check your incision area every day for signs of infection. Check for: Redness, swelling, or pain. Fluid or blood. Warmth. Pus or a bad smell. Do not take baths, swim, or use a hot tub until your health care provider approves. If you have a splint or brace: Wear it as told by your health care provider. Remove it only as told by your health care provider. You may need to wear the splint or brace for  several days. Loosen the splint or brace if your fingers (or toes) tingle, become numb, or turn cold and blue. Keep the splint or brace clean. If the splint or brace is not waterproof: Do not let it get wet. Ask if you can remove it for bathing. If not, cover it with a watertight covering when you take a bath or shower. Managing pain, stiffness, and swelling  If directed, put ice on the incision area. To do this: If you have a removable splint or brace, remove it as told by your health care provider. Put ice in a plastic bag. Place a towel between your skin and the bag or between the splint or brace and the bag. Leave the ice on for 20 minutes, 2-3 times a day. Move your fingers (or toes) often to reduce stiffness and  swelling. Raise (elevate) the affected area above the level of your heart while you are sitting or lying down. Activity Return to your normal activities as told by your health care provider. Ask your health care provider what activities are safe for you. Avoid activities that cause pain. Ask your health care provider when it is safe to drive and when you should start doing movement exercises. General instructions Do not use any products that contain nicotine or tobacco, such as cigarettes, e-cigarettes, and chewing tobacco. These can delay healing. If you need help quitting, ask your health care provider. Keep all follow-up visits as told by your health care provider. This is important. Contact a health care provider if: Medicine is not relieving your pain. You have stiffness or swelling that gets worse. Your splint or brace is not fitting correctly, causing your fingers (or toes) to tingle, become numb, or turn cold and blue. You have any signs of infection at your incision site. You have a fever. Summary After the procedure, you can expect to have some soreness, stiffness, and swelling. You may need to wear a splint or brace for a few days. Follow instructions for changing your dressing and checking your incision area for signs of infection. Contact your health care provider if you have a fever, signs of infection, stiffness or swelling that gets worse, or continuing pain, tingling, or numbness. This information is not intended to replace advice given to you by your health care provider. Make sure you discuss any questions you have with your health care provider. Document Revised: 11/07/2019 Document Reviewed: 11/07/2019 Elsevier Patient Education  [place] After This sheet gives you information about how to care for yourself after your procedure. Your health care provider may also give you more specific instructions. If you have problems or  questions, contact your health care provider. What can I expect after the procedure? After the procedure, it is common to have: Tiredness. Forgetfulness about what happened after the procedure. Impaired judgment for important decisions. Nausea or vomiting. Some difficulty with balance. Follow these instructions at home: For the time period you were told by your health care provider:   Rest as needed. Do not participate in activities where you could fall or become injured. Do not drive or use machinery. Do not drink alcohol. Do not take sleeping pills or medicines that cause drowsiness. Do not make important decisions or sign legal documents. Do not take care of children on your own. Eating and drinking Follow the diet that is recommended by your health care provider. Drink enough fluid to keep your urine pale yellow. If you vomit: Drink water, juice,  or soup when you can drink without vomiting. Make sure you have little or no nausea before eating solid foods. General instructions Have a responsible adult stay with you for the time you are told. It is important to have someone help care for you until you are awake and alert. Take over-the-counter and prescription medicines only as told by your health care provider. If you have sleep apnea, surgery and certain medicines can increase your risk for breathing problems. Follow instructions from your health care provider about wearing your sleep device: Anytime you are sleeping, including during daytime naps. While taking prescription pain medicines, sleeping medicines, or medicines that make you drowsy. Avoid smoking. Keep all follow-up visits as told by your health care provider. This is important. Contact a health care provider if: You keep feeling nauseous or you keep vomiting. You feel light-headed. You are still sleepy or having trouble with balance after 24 hours. You develop a rash. You have a fever. You have redness or  swelling around the IV site. Get help right away if: You have trouble breathing. You have new-onset confusion at home. Summary For several hours after your procedure, you may feel tired. You may also be forgetful and have poor judgment. Have a responsible adult stay with you for the time you are told. It is important to have someone help care for you until you are awake and alert. Rest as told. Do not drive or operate machinery. Do not drink alcohol or take sleeping pills. Get help right away if you have trouble breathing, or if you suddenly become confused. This information is not intended to replace advice given to you by your health care provider. Make sure you discuss any questions you have with your health care provider. Document Revised: 04/29/2020 Document Reviewed: 07/17/2019 Elsevier Patient Education  2022 [employer]. How to Use Chlorhexidine for Bathing Chlorhexidine gluconate (CHG) is a germ-killing (antiseptic) solution that is used to clean the skin. It can get rid of the bacteria that normally live on the skin and can keep them away for about 24 hours. To clean your skin with CHG, you may be given: A CHG solution to use in the shower or as part of a sponge bath. A prepackaged cloth that contains CHG. Cleaning your skin with CHG may help lower the risk for infection: While you are staying in the intensive care unit of the hospital. If you have a vascular access, such as a central line, to provide short-term or long-term access to your veins. If you have a catheter to drain urine from your bladder. If you are on a ventilator. A ventilator is a machine that helps you breathe by moving air in and out of your lungs. After surgery. What are the risks? Risks of using CHG include: A skin reaction. Hearing loss, if CHG gets in your ears and you have a perforated eardrum. Eye injury, if CHG gets in your eyes and is not rinsed out. The CHG product catching fire. Make sure that you  avoid smoking and flames after applying CHG to your skin. Do not use CHG: If you have a chlorhexidine allergy or have previously reacted to chlorhexidine. On babies younger than 50 months of age. How to use CHG solution Use CHG only as told by your health care provider, and follow the instructions on the label. Use the full amount of CHG as directed. Usually, this is one bottle. During a shower Follow these steps when using CHG solution during a shower (  unless your health care provider gives you different instructions): Start the shower. Use your normal soap and shampoo to wash your face and hair. Turn off the shower or move out of the shower stream. Pour the CHG onto a clean washcloth. Do not use any type of brush or rough-edged sponge. Starting at your neck, lather your body down to your toes. Make sure you follow these instructions: If you will be having surgery, pay special attention to the part of your body where you will be having surgery. Scrub this area for at least 1 minute. Do not use CHG on your head or face. If the solution gets into your ears or eyes, rinse them well with water. Avoid your genital area. Avoid any areas of skin that have broken skin, cuts, or scrapes. Scrub your back and under your arms. Make sure to wash skin folds. Let the lather sit on your skin for 1-2 minutes or as long as told by your health care provider. Thoroughly rinse your entire body in the shower. Make sure that all body creases and crevices are rinsed well. Dry off with a clean towel. Do not put any substances on your body afterward--such as powder, lotion, or perfume--unless you are told to do so by your health care provider. Only use lotions that are recommended by the manufacturer. Put on clean clothes or pajamas. If it is the night before your surgery, sleep in clean sheets.  During a sponge bath Follow these steps when using CHG solution during a sponge bath (unless your health care provider  gives you different instructions): Use your normal soap and shampoo to wash your face and hair. Pour the CHG onto a clean washcloth. Starting at your neck, lather your body down to your toes. Make sure you follow these instructions: If you will be having surgery, pay special attention to the part of your body where you will be having surgery. Scrub this area for at least 1 minute. Do not use CHG on your head or face. If the solution gets into your ears or eyes, rinse them well with water. Avoid your genital area. Avoid any areas of skin that have broken skin, cuts, or scrapes. Scrub your back and under your arms. Make sure to wash skin folds. Let the lather sit on your skin for 1-2 minutes or as long as told by your health care provider. Using a different clean, wet washcloth, thoroughly rinse your entire body. Make sure that all body creases and crevices are rinsed well. Dry off with a clean towel. Do not put any substances on your body afterward--such as powder, lotion, or perfume--unless you are told to do so by your health care provider. Only use lotions that are recommended by the manufacturer. Put on clean clothes or pajamas. If it is the night before your surgery, sleep in clean sheets. How to use CHG prepackaged cloths Only use CHG cloths as told by your health care provider, and follow the instructions on the label. Use the CHG cloth on clean, dry skin. Do not use the CHG cloth on your head or face unless your health care provider tells you to. When washing with the CHG cloth: Avoid your genital area. Avoid any areas of skin that have broken skin, cuts, or scrapes. Before surgery Follow these steps when using a CHG cloth to clean before surgery (unless your health care provider gives you different instructions): Using the CHG cloth, vigorously scrub the part of your body where you will  be having surgery. Scrub using a back-and-forth motion for 3 minutes. The area on your body should  be completely wet with CHG when you are done scrubbing. Do not rinse. Discard the cloth and let the area air-dry. Do not put any substances on the area afterward, such as powder, lotion, or perfume. Put on clean clothes or pajamas. If it is the night before your surgery, sleep in clean sheets.  For general bathing Follow these steps when using CHG cloths for general bathing (unless your health care provider gives you different instructions). Use a separate CHG cloth for each area of your body. Make sure you wash between any folds of skin and between your fingers and toes. Wash your body in the following order, switching to a new cloth after each step: The front of your neck, shoulders, and chest. Both of your arms, under your arms, and your hands. Your stomach and groin area, avoiding the genitals. Your right leg and foot. Your left leg and foot. The back of your neck, your back, and your buttocks. Do not rinse. Discard the cloth and let the area air-dry. Do not put any substances on your body afterward--such as powder, lotion, or perfume--unless you are told to do so by your health care provider. Only use lotions that are recommended by the manufacturer. Put on clean clothes or pajamas. Contact a health care provider if: Your skin gets irritated after scrubbing. You have questions about using your solution or cloth. You swallow any chlorhexidine. Call your local poison control center (1-4322093159 in the [place] Get help right away if: Your eyes itch badly, or they become very red or swollen. Your skin itches badly and is red or swollen. Your hearing changes. You have trouble seeing. You have swelling or tingling in your mouth or throat. You have trouble breathing. These symptoms may represent a serious problem that is an emergency. Do not wait to see if the symptoms will go away. Get medical help right away. Call your local emergency services (911 in the [place] Do not drive yourself to  the hospital. Summary Chlorhexidine gluconate (CHG) is a germ-killing (antiseptic) solution that is used to clean the skin. Cleaning your skin with CHG may help to lower your risk for infection. You may be given CHG to use for bathing. It may be in a bottle or in a prepackaged cloth to use on your skin. Carefully follow your health care provider's instructions and the instructions on the product label. Do not use CHG if you have a chlorhexidine allergy. Contact your health care provider if your skin gets irritated after scrubbing. This information is not intended to replace advice given to you by your health care provider. Make sure you discuss any questions you have with your health care provider. Document Revised: 10/25/2020 Document Reviewed: 10/25/2020 Elsevier Patient Education  2022 [employer].

## 2021-05-03 ENCOUNTER — Encounter (HOSPITAL_COMMUNITY)
Admission: RE | Admit: 2021-05-03 | Discharge: 2021-05-03 | Disposition: A | Discharge status: Home / Self Care | Payer: BC Managed Care – PPO | Source: Ambulatory Visit | Attending: Orthopedic Surgery | Admitting: Orthopedic Surgery

## 2021-05-03 ENCOUNTER — Other Ambulatory Visit: Payer: Self-pay

## 2021-05-03 DIAGNOSIS — Z01818 Encounter for other preprocedural examination: Secondary | ICD-10-CM | POA: Diagnosis not present

## 2021-05-03 LAB — CBC WITH DIFFERENTIAL/PLATELET
Abs Immature Granulocytes: 0.01 10*3/uL (ref 0.00–0.07)
Basophils Absolute: 0 10*3/uL (ref 0.0–0.1)
Basophils Relative: 1 %
Eosinophils Absolute: 0.1 10*3/uL (ref 0.0–0.5)
Eosinophils Relative: 1 %
HCT: 39.6 % (ref 36.0–46.0)
Hemoglobin: 12.9 g/dL (ref 12.0–15.0)
Immature Granulocytes: 0 %
Lymphocytes Relative: 41 %
Lymphs Abs: 2.2 10*3/uL (ref 0.7–4.0)
MCH: 26.1 pg (ref 26.0–34.0)
MCHC: 32.6 g/dL (ref 30.0–36.0)
MCV: 80 fL (ref 80.0–100.0)
Monocytes Absolute: 0.4 10*3/uL (ref 0.1–1.0)
Monocytes Relative: 8 %
Neutro Abs: 2.7 10*3/uL (ref 1.7–7.7)
Neutrophils Relative %: 49 %
Platelets: 284 10*3/uL (ref 150–400)
RBC: 4.95 MIL/uL (ref 3.87–5.11)
RDW: 14.8 % (ref 11.5–15.5)
WBC: 5.4 10*3/uL (ref 4.0–10.5)
nRBC: 0 % (ref 0.0–0.2)

## 2021-05-03 LAB — BASIC METABOLIC PANEL
Anion gap: 7 (ref 5–15)
BUN: 12 mg/dL (ref 6–20)
CO2: 25 mmol/L (ref 22–32)
Calcium: 9.6 mg/dL (ref 8.9–10.3)
Chloride: 107 mmol/L (ref 98–111)
Creatinine, Ser: 0.67 mg/dL (ref 0.44–1.00)
GFR, Estimated: >60 mL/min (ref >60)
Glucose, Bld: 102 mg/dL — ABNORMAL HIGH (ref 70–99)
Potassium: 3.6 mmol/L (ref 3.5–5.1)
Sodium: 139 mmol/L (ref 135–145)

## 2021-05-03 LAB — HCG, SERUM, QUALITATIVE: Preg, Serum: NEGATIVE

## 2021-05-06 ENCOUNTER — Encounter (HOSPITAL_COMMUNITY)
Admission: RE | Disposition: A | Discharge status: Home / Self Care | Payer: Self-pay | Source: Home / Self Care | Attending: Orthopedic Surgery

## 2021-05-06 ENCOUNTER — Ambulatory Visit (HOSPITAL_COMMUNITY)
Admission: RE | Admit: 2021-05-06 | Discharge: 2021-05-06 | Disposition: A | Discharge status: Home / Self Care | Payer: BC Managed Care – PPO | Attending: Orthopedic Surgery | Admitting: Orthopedic Surgery

## 2021-05-06 ENCOUNTER — Ambulatory Visit (HOSPITAL_COMMUNITY): Payer: BC Managed Care – PPO | Admitting: Anesthesiology

## 2021-05-06 ENCOUNTER — Encounter (HOSPITAL_COMMUNITY): Payer: Self-pay | Admitting: Orthopedic Surgery

## 2021-05-06 DIAGNOSIS — Z8249 Family history of ischemic heart disease and other diseases of the circulatory system: Secondary | ICD-10-CM | POA: Insufficient documentation

## 2021-05-06 DIAGNOSIS — M67432 Ganglion, left wrist: Secondary | ICD-10-CM | POA: Diagnosis not present

## 2021-05-06 DIAGNOSIS — M6740 Ganglion, unspecified site: Secondary | ICD-10-CM

## 2021-05-06 DIAGNOSIS — M67439 Ganglion, unspecified wrist: Secondary | ICD-10-CM

## 2021-05-06 DIAGNOSIS — F17210 Nicotine dependence, cigarettes, uncomplicated: Secondary | ICD-10-CM | POA: Insufficient documentation

## 2021-05-06 DIAGNOSIS — Z8042 Family history of malignant neoplasm of prostate: Secondary | ICD-10-CM | POA: Diagnosis not present

## 2021-05-06 HISTORY — PX: GANGLION CYST EXCISION: SHX1691

## 2021-05-06 SURGERY — EXCISION, GANGLION CYST, WRIST
Anesthesia: General | Site: Wrist | Laterality: Left

## 2021-05-06 MED ORDER — LABETALOL HCL 5 MG/ML IV SOLN
5.0000 mg | INTRAVENOUS | Status: DC | PRN
  Administered 2021-05-06 (×3): 5 mg via INTRAVENOUS

## 2021-05-06 MED ORDER — KETOROLAC TROMETHAMINE 30 MG/ML IJ SOLN
INTRAMUSCULAR | Status: AC
  Filled 2021-05-06: qty 1

## 2021-05-06 MED ORDER — LACTATED RINGERS IV SOLN
INTRAVENOUS | Status: DC
  Administered 2021-05-06: 1000 mL via INTRAVENOUS

## 2021-05-06 MED ORDER — PROPOFOL 10 MG/ML IV BOLUS
INTRAVENOUS | Status: AC
  Filled 2021-05-06: qty 20

## 2021-05-06 MED ORDER — FENTANYL CITRATE PF 50 MCG/ML IJ SOSY: 25.0000 ug | PREFILLED_SYRINGE | INTRAMUSCULAR | Status: DC | PRN

## 2021-05-06 MED ORDER — ONDANSETRON HCL 4 MG/2ML IJ SOLN
INTRAMUSCULAR | Status: AC
  Filled 2021-05-06: qty 2

## 2021-05-06 MED ORDER — GLYCOPYRROLATE PF 0.2 MG/ML IJ SOSY
PREFILLED_SYRINGE | INTRAMUSCULAR | Status: AC
  Filled 2021-05-06: qty 1

## 2021-05-06 MED ORDER — LIDOCAINE 2% (20 MG/ML) 5 ML SYRINGE
INTRAMUSCULAR | Status: DC | PRN
  Administered 2021-05-06: 100 mg via INTRAVENOUS

## 2021-05-06 MED ORDER — BUPIVACAINE HCL (PF) 0.5 % IJ SOLN
INTRAMUSCULAR | Status: DC | PRN
  Administered 2021-05-06: 7 mL

## 2021-05-06 MED ORDER — MIDAZOLAM HCL 5 MG/5ML IJ SOLN
INTRAMUSCULAR | Status: DC | PRN
  Administered 2021-05-06: 2 mg via INTRAVENOUS

## 2021-05-06 MED ORDER — GLYCOPYRROLATE PF 0.2 MG/ML IJ SOSY
PREFILLED_SYRINGE | INTRAMUSCULAR | Status: DC | PRN
  Administered 2021-05-06: .1 mg via INTRAVENOUS

## 2021-05-06 MED ORDER — FENTANYL CITRATE (PF) 100 MCG/2ML IJ SOLN
INTRAMUSCULAR | Status: AC
  Filled 2021-05-06: qty 2

## 2021-05-06 MED ORDER — LABETALOL HCL 5 MG/ML IV SOLN
INTRAVENOUS | Status: AC
  Filled 2021-05-06: qty 4

## 2021-05-06 MED ORDER — MIDAZOLAM HCL 2 MG/2ML IJ SOLN
INTRAMUSCULAR | Status: AC
  Filled 2021-05-06: qty 2

## 2021-05-06 MED ORDER — ONDANSETRON HCL 4 MG/2ML IJ SOLN: 4.0000 mg | Freq: Once | INTRAMUSCULAR | Status: DC | PRN

## 2021-05-06 MED ORDER — DEXAMETHASONE SODIUM PHOSPHATE 4 MG/ML IJ SOLN
INTRAMUSCULAR | Status: DC | PRN
  Administered 2021-05-06: 4 mg via INTRAVENOUS

## 2021-05-06 MED ORDER — SODIUM CHLORIDE 0.9 % IR SOLN
Status: DC | PRN
  Administered 2021-05-06: 1

## 2021-05-06 MED ORDER — FENTANYL CITRATE (PF) 100 MCG/2ML IJ SOLN
INTRAMUSCULAR | Status: DC | PRN
  Administered 2021-05-06: 50 ug via INTRAVENOUS
  Administered 2021-05-06 (×2): 25 ug via INTRAVENOUS

## 2021-05-06 MED ORDER — KETOROLAC TROMETHAMINE 30 MG/ML IJ SOLN
INTRAMUSCULAR | Status: DC | PRN
  Administered 2021-05-06: 30 mg via INTRAVENOUS

## 2021-05-06 MED ORDER — BUPIVACAINE HCL (PF) 0.5 % IJ SOLN
INTRAMUSCULAR | Status: AC
  Filled 2021-05-06: qty 30

## 2021-05-06 MED ORDER — ONDANSETRON HCL 4 MG/2ML IJ SOLN
INTRAMUSCULAR | Status: DC | PRN
  Administered 2021-05-06: 4 mg via INTRAVENOUS

## 2021-05-06 MED ORDER — HYDROCODONE-ACETAMINOPHEN 7.5-325 MG PO TABS: 1.0000 | ORAL_TABLET | ORAL | 0 refills | Status: DC | PRN

## 2021-05-06 MED ORDER — CEFAZOLIN SODIUM-DEXTROSE 2-4 GM/100ML-% IV SOLN
2.0000 g | INTRAVENOUS | Status: AC
  Administered 2021-05-06: 2 g via INTRAVENOUS
  Filled 2021-05-06: qty 100

## 2021-05-06 MED ORDER — LIDOCAINE HCL (PF) 2 % IJ SOLN
INTRAMUSCULAR | Status: AC
  Filled 2021-05-06: qty 5

## 2021-05-06 MED ORDER — ORAL CARE MOUTH RINSE: 15.0000 mL | Freq: Once | OROMUCOSAL | Status: AC

## 2021-05-06 MED ORDER — PROPOFOL 10 MG/ML IV BOLUS
INTRAVENOUS | Status: DC | PRN
  Administered 2021-05-06: 160 mg via INTRAVENOUS

## 2021-05-06 MED ORDER — CHLORHEXIDINE GLUCONATE 0.12 % MT SOLN
15.0000 mL | Freq: Once | OROMUCOSAL | Status: AC
  Administered 2021-05-06: 15 mL via OROMUCOSAL

## 2021-05-06 SURGICAL SUPPLY — 44 items
APL PRP STRL LF DISP 70% ISPRP (MISCELLANEOUS) ×1
APL SKNCLS STERI-STRIP NONHPOA (GAUZE/BANDAGES/DRESSINGS) ×1
BANDAGE ACE 3X5.8 VEL STRL LF (GAUZE/BANDAGES/DRESSINGS) ×2 IMPLANT
BANDAGE ESMARK 4X12 BL STRL LF (DISPOSABLE) IMPLANT
BENZOIN TINCTURE PRP APPL 2/3 (GAUZE/BANDAGES/DRESSINGS) ×2 IMPLANT
BLADE SURG 15 STRL LF DISP TIS (BLADE) ×1 IMPLANT
BLADE SURG 15 STRL SS (BLADE) ×2
BNDG CMPR 12X4 ELC STRL LF (DISPOSABLE)
BNDG CMPR STD VLCR NS LF 5.8X3 (GAUZE/BANDAGES/DRESSINGS) ×1
BNDG COHESIVE 4X5 TAN STRL (GAUZE/BANDAGES/DRESSINGS) ×2 IMPLANT
BNDG ELASTIC 3X5.8 VLCR NS LF (GAUZE/BANDAGES/DRESSINGS) ×2 IMPLANT
BNDG ESMARK 4X12 BLUE STRL LF (DISPOSABLE)
BNDG GAUZE ELAST 4 BULKY (GAUZE/BANDAGES/DRESSINGS) ×2 IMPLANT
CHLORAPREP W/TINT 26 (MISCELLANEOUS) ×2 IMPLANT
CLOSURE STERI STRIP 1/2 X4 (GAUZE/BANDAGES/DRESSINGS) ×2 IMPLANT
CLOTH BEACON ORANGE TIMEOUT ST (SAFETY) ×2 IMPLANT
COVER LIGHT HANDLE STERIS (MISCELLANEOUS) ×4 IMPLANT
CUFF TOURN SGL QUICK 18X4 (TOURNIQUET CUFF) ×2 IMPLANT
DECANTER SPIKE VIAL GLASS SM (MISCELLANEOUS) IMPLANT
DRSG XEROFORM 1X8 (GAUZE/BANDAGES/DRESSINGS) ×2 IMPLANT
ELECT NEEDLE TIP 2.8 STRL (NEEDLE) IMPLANT
ELECT REM PT RETURN 9FT ADLT (ELECTROSURGICAL) ×2
ELECTRODE REM PT RTRN 9FT ADLT (ELECTROSURGICAL) ×1 IMPLANT
GAUZE SPONGE 4X4 12PLY STRL (GAUZE/BANDAGES/DRESSINGS) ×2 IMPLANT
GAUZE SPONGE 4X4 12PLY STRL LF (GAUZE/BANDAGES/DRESSINGS) ×2 IMPLANT
GAUZE XEROFORM 1X8 LF (GAUZE/BANDAGES/DRESSINGS) ×2 IMPLANT
GLOVE SS N UNI LF 8.5 STRL (GLOVE) ×2 IMPLANT
GLOVE SURG POLYISO LF SZ8 (GLOVE) ×2 IMPLANT
GLOVE SURG UNDER POLY LF SZ7 (GLOVE) ×4 IMPLANT
GOWN STRL REUS W/TWL LRG LVL3 (GOWN DISPOSABLE) ×2 IMPLANT
GOWN STRL REUS W/TWL XL LVL3 (GOWN DISPOSABLE) ×2 IMPLANT
KIT TURNOVER KIT A (KITS) ×2 IMPLANT
MANIFOLD NEPTUNE II (INSTRUMENTS) ×2 IMPLANT
NEEDLE HYPO 21X1.5 SAFETY (NEEDLE) ×2 IMPLANT
NS IRRIG 1000ML POUR BTL (IV SOLUTION) ×2 IMPLANT
PACK BASIC LIMB (CUSTOM PROCEDURE TRAY) ×2 IMPLANT
PAD ARMBOARD 7.5X6 YLW CONV (MISCELLANEOUS) ×2 IMPLANT
SET BASIN LINEN APH (SET/KITS/TRAYS/PACK) ×2 IMPLANT
STRIP CLOSURE SKIN 1/2X4 (GAUZE/BANDAGES/DRESSINGS) ×2 IMPLANT
SUT ETHILON 3 0 FSL (SUTURE) IMPLANT
SUT MON AB 2-0 SH 27 (SUTURE) ×2
SUT MON AB 2-0 SH27 (SUTURE) ×1 IMPLANT
SUT PROLENE 3 0 PS 2 (SUTURE) IMPLANT
SYR CONTROL 10ML LL (SYRINGE) ×2 IMPLANT

## 2021-05-06 NOTE — Anesthesia Postprocedure Evaluation (Signed)
Anesthesia Post Note  Patient: [name]  Procedure(s) Performed: REMOVAL GANGLION OF WRIST (Left: Wrist)  Patient location during evaluation: Phase II Anesthesia Type: General Level of consciousness: awake Pain management: pain level controlled Vital Signs Assessment: post-procedure vital signs reviewed and stable Respiratory status: spontaneous breathing and respiratory function stable Cardiovascular status: blood pressure returned to baseline and stable Postop Assessment: no headache and no apparent nausea or vomiting Anesthetic complications: no Comments: Late entry   No notable events documented.   Last Vitals:  Vitals:   05/06/21 1045 05/06/21 1055  BP: (!) 145/103 (!) 147/108  Pulse: 69 78  Resp: 11 16  Temp:    SpO2: 95% 94%    Last Pain:  Vitals:   05/06/21 1055  TempSrc:   PainSc: 0-No pain                 Louann Sjogren

## 2021-05-06 NOTE — Op Note (Signed)
05/06/2021  9:50 AM  PATIENT:  [name]  55 y.o. female  PRE-OPERATIVE DIAGNOSIS:  left wrist ganglion cyst  POST-OPERATIVE DIAGNOSIS:  left DORSAL  wrist ganglion cyst  PROCEDURE:  Procedure(s): REMOVAL GANGLION OF WRIST (Left)  Findings there was a 2 cm x 3 cm dorsal wrist ganglion with a stalk tracking down to the carpal bones appear to be between the second and fourth extensor compartments  Procedure details  The patient was taken to the operating room for excision of dorsal wrist ganglion from the left wrist.  That he had general anesthesia with an LMA  She was placed in the supine position left upper extremities prepped and draped sterilely  After the timeout was completed the limb was exsanguinated with a 4 inch Esmarch the tourniquet was elevated to 250 mmHg  A transverse vision was made in Langer's lines subcutaneous tissue was divided down to the mass.  It appeared to be multiple lobules to it with 1 single stalk tracking down to the carpal bones.  Sharp and blunt dissection was carried out until the stalk was dissected down to the carpal bones at which time it was resected and then cauterized.  The wound was irrigated and closed with 2-0 Monocryl suture in running subcuticular fashion with benzoin and Steri-Strips followed by injection of 7 cc of plain Marcaine half percent  The tourniquet was released sterile dressing was applied the patient was extubated taken to recovery room in stable condition  SURGEON:  Surgeon(s) and Role:    * Aline Brochure, Tim Lair, MD - Primary  PHYSICIAN ASSISTANT:   ASSISTANTS: wendy kendrick   ANESTHESIA:   general  EBL:  none   BLOOD ADMINISTERED:none  DRAINS: none   LOCAL MEDICATIONS USED:  MARCAINE     SPECIMEN:  Source of Specimen:  dorsal wrist between 2/4 extensor compartments   DISPOSITION OF SPECIMEN:  PATHOLOGY  COUNTS:  YES  TOURNIQUET:   Total Tourniquet Time Documented: Upper Arm (Left)   DICTATION:  .Dragon Dictation  PLAN OF CARE: Discharge to home after PACU  PATIENT DISPOSITION:  PACU - hemodynamically stable.   Delay start of Pharmacological VTE agent (>24hrs) due to surgical blood loss or risk of bleeding: not applicable

## 2021-05-06 NOTE — H&P (Signed)
  Chief Complaint  Patient presents with   Wrist Pain      Pt with nodule on left wrist for several months. Pt states as it's gotten bigger it's become more painful. Pt states she can still lift things but can't lift anything heavy.   Medication Refill      Hydrocodone       75 female mass dorsum left wrist pain with extension getting bigger no trauma, history of ganglion cyst foot     Body mass index is 25.33 kg/m.   BP 123/89   Pulse 93   Ht '5\' 3"'$  (1.6 m)   Wt 143 lb (64.9 kg)   LMP 01/23/2018   BMI 25.33 kg/m        Past Medical History:  Diagnosis Date   GERD (gastroesophageal reflux disease)     Hypertension     Vaginal Pap smear, abnormal        Past Surgical History:  Procedure Laterality Date   COLONOSCOPY WITH PROPOFOL N/A 11/14/2019   Procedure: COLONOSCOPY WITH PROPOFOL;  Surgeon: Daneil Dolin, MD; diverticulosis in the entire examined colon, 6 mm sessile polyp in the ascending colon, otherwise normal exam.  Pathology benign.  Recommended repeat colonoscopy in 10 years.   EYE SURGERY     POLYPECTOMY  11/14/2019   Procedure: POLYPECTOMY;  Surgeon: Daneil Dolin, MD;  Location: [facility];  Service: Endoscopy;;   TONSILLECTOMY      Family History  Problem Relation Age of Onset   Hypertension Mother    Prostate cancer Maternal Grandfather        passed in his 54s.     Social History   Tobacco Use   Smoking status: Every Day    Packs/day: 0.50    Years: 30.00    Pack years: 15.00    Types: Cigarettes   Smokeless tobacco: Never  Vaping Use   Vaping Use: Never used  Substance Use Topics   Alcohol use: Yes    Comment: occassional   Drug use: No     Physical Exam   Constitutional: Development normal, nutrition normal, body habitus normal thin Mental status oriented x3 mood and affect no depression Cardiovascular pulses and temperature normal    Gait noncontributory   Musculoskeletal:  Inspection: Left wrist dorsal ganglion type mass  seems to be lobulated Range of motion: Normal with pain at terminal extension and flexion Stability tests were normal Muscle strength and tone: Normal   Skin warm dry and intact no erythema   Neurological sensation normal   Assessment and plan:       Encounter Diagnosis  Name Primary?   Ganglion cyst Yes    X-ray negative x-ray  Patient has opted for excision when given the choice of no other treatment aspiration or excision  15% recurrence rate patient willing to take that risk  Patient will be booked for an excision left wrist ganglion

## 2021-05-06 NOTE — Transfer of Care (Signed)
Immediate Anesthesia Transfer of Care Note  Patient: [name]  Procedure(s) Performed: REMOVAL GANGLION OF WRIST (Left: Wrist)  Patient Location: PACU  Anesthesia Type:General  Level of Consciousness: drowsy  Airway & Oxygen Therapy: Patient Spontanous Breathing and Patient connected to face mask oxygen  Post-op Assessment: Report given to RN and Post -op Vital signs reviewed and stable  Post vital signs: Reviewed and stable  Last Vitals:  Vitals Value Taken Time  BP    Temp    Pulse    Resp    SpO2      Last Pain:  Vitals:   05/06/21 0753  TempSrc: Oral  PainSc: 0-No pain      Patients Stated Pain Goal: 5 ([phone])  Complications: No notable events documented.

## 2021-05-06 NOTE — Brief Op Note (Signed)
05/06/2021  9:50 AM  PATIENT:  [name]  55 y.o. female  PRE-OPERATIVE DIAGNOSIS:  left wrist ganglion cyst  POST-OPERATIVE DIAGNOSIS:  left DORSAL  wrist ganglion cyst  PROCEDURE:  Procedure(s): REMOVAL GANGLION OF WRIST (Left)  SURGEON:  Surgeon(s) and Role:    Carole Civil, MD - Primary  PHYSICIAN ASSISTANT:   ASSISTANTS: wendy kendrick   ANESTHESIA:   general  EBL:  none   BLOOD ADMINISTERED:none  DRAINS: none   LOCAL MEDICATIONS USED:  MARCAINE     SPECIMEN:  Source of Specimen:  dorsal wrist between 2/4 extensor compartments   DISPOSITION OF SPECIMEN:  PATHOLOGY  COUNTS:  YES  TOURNIQUET:   Total Tourniquet Time Documented: Upper Arm (Left)   DICTATION: .Dragon Dictation  PLAN OF CARE: Discharge to home after PACU  PATIENT DISPOSITION:  PACU - hemodynamically stable.   Delay start of Pharmacological VTE agent (>24hrs) due to surgical blood loss or risk of bleeding: not applicable

## 2021-05-06 NOTE — Anesthesia Preprocedure Evaluation (Signed)
Anesthesia Evaluation  Patient identified by MRN, date of birth, ID band Patient awake    Reviewed: Allergy & Precautions, H&P , NPO status , Patient's Chart, lab work & pertinent test results, reviewed documented beta blocker date and time   Airway Mallampati: II  TM Distance: >3 FB Neck ROM: full    Dental no notable dental hx.    Pulmonary neg pulmonary ROS, Current Smoker and Patient abstained from smoking.,    Pulmonary exam normal breath sounds clear to auscultation       Cardiovascular Exercise Tolerance: Good hypertension, negative cardio ROS   Rhythm:regular Rate:Normal     Neuro/Psych negative neurological ROS  negative psych ROS   GI/Hepatic Neg liver ROS, GERD  Medicated,  Endo/Other  negative endocrine ROS  Renal/GU negative Renal ROS  negative genitourinary   Musculoskeletal   Abdominal   Peds  Hematology negative hematology ROS (+)   Anesthesia Other Findings   Reproductive/Obstetrics negative OB ROS                             Anesthesia Physical Anesthesia Plan  ASA: 2  Anesthesia Plan: General and General LMA   Post-op Pain Management:    Induction:   PONV Risk Score and Plan: Ondansetron  Airway Management Planned:   Additional Equipment:   Intra-op Plan:   Post-operative Plan:   Informed Consent: I have reviewed the patients History and Physical, chart, labs and discussed the procedure including the risks, benefits and alternatives for the proposed anesthesia with the patient or authorized representative who has indicated his/her understanding and acceptance.     Dental Advisory Given  Plan Discussed with: CRNA  Anesthesia Plan Comments:         Anesthesia Quick Evaluation

## 2021-05-06 NOTE — Anesthesia Procedure Notes (Signed)
Procedure Name: LMA Insertion Date/Time: 05/06/2021 9:03 AM Performed by: Orlie Dakin, CRNA Pre-anesthesia Checklist: Patient identified, Emergency Drugs available, Suction available and Patient being monitored Patient Re-evaluated:Patient Re-evaluated prior to induction Oxygen Delivery Method: Circle system utilized Preoxygenation: Pre-oxygenation with 100% oxygen Induction Type: IV induction LMA: LMA inserted LMA Size: 4.0 Tube type: Oral Number of attempts: 1 Placement Confirmation: positive ETCO2 Tube secured with: Tape Dental Injury: Teeth and Oropharynx as per pre-operative assessment

## 2021-05-06 NOTE — Interval H&P Note (Signed)
History and Physical Interval Note:  05/06/2021 8:47 AM  [name]  has presented today for surgery, with the diagnosis of left wrist ganglion cyst.  The various methods of treatment have been discussed with the patient and family. After consideration of risks, benefits and other options for treatment, the patient has consented to  Procedure(s): REMOVAL GANGLION OF WRIST (Left) as a surgical intervention.  The patient's history has been reviewed, patient examined, no change in status, stable for surgery.  I have reviewed the patient's chart and labs.  Questions were answered to the patient's satisfaction.     Arther Abbott

## 2021-05-09 ENCOUNTER — Encounter (HOSPITAL_COMMUNITY): Payer: Self-pay | Admitting: Orthopedic Surgery

## 2021-05-09 LAB — SURGICAL PATHOLOGY

## 2021-05-11 ENCOUNTER — Encounter: Payer: Self-pay | Admitting: Orthopedic Surgery

## 2021-05-11 ENCOUNTER — Ambulatory Visit (INDEPENDENT_AMBULATORY_CARE_PROVIDER_SITE_OTHER): Payer: BC Managed Care – PPO | Admitting: Orthopedic Surgery

## 2021-05-11 ENCOUNTER — Other Ambulatory Visit: Payer: Self-pay

## 2021-05-11 DIAGNOSIS — M6740 Ganglion, unspecified site: Secondary | ICD-10-CM

## 2021-05-11 DIAGNOSIS — M67432 Ganglion, left wrist: Secondary | ICD-10-CM

## 2021-05-11 NOTE — Progress Notes (Signed)
Chief Complaint  Patient presents with   Post-op Follow-up    05/06/21 Ganglion cyst removal wrist left    Encounter Diagnosis  Name Primary?   Ganglion cyst excision 05/06/21 Yes    Status post ganglion cyst removal dorsal left wrist  No major complaints  Today for wound check  Wound looks good Steri-Strips reapplied with a Tegaderm  Patient will follow-up next week to have the subcuticular stitches cut at the skin edges  Ace wrap for support okay to shower.

## 2021-05-19 ENCOUNTER — Ambulatory Visit (INDEPENDENT_AMBULATORY_CARE_PROVIDER_SITE_OTHER): Payer: BC Managed Care – PPO | Admitting: Orthopedic Surgery

## 2021-05-19 ENCOUNTER — Encounter: Payer: Self-pay | Admitting: Orthopedic Surgery

## 2021-05-19 ENCOUNTER — Other Ambulatory Visit: Payer: Self-pay

## 2021-05-19 VITALS — Ht 63.0 in | Wt 145.0 lb

## 2021-05-19 DIAGNOSIS — M6740 Ganglion, unspecified site: Secondary | ICD-10-CM

## 2021-05-19 DIAGNOSIS — M67432 Ganglion, left wrist: Secondary | ICD-10-CM

## 2021-05-19 MED ORDER — HYDROCODONE-ACETAMINOPHEN 7.5-325 MG PO TABS
1.0000 | ORAL_TABLET | Freq: Four times a day (QID) | ORAL | 0 refills | Status: DC | PRN
Start: 2021-05-19 — End: 2022-02-02

## 2021-05-19 NOTE — Patient Instructions (Signed)
Change oow to RTW oct 10

## 2021-05-19 NOTE — Progress Notes (Signed)
Chief Complaint  Patient presents with   Routine Post Op    Lt cyst removal DOS 05/06/21   Medication Refill    hydrocodone   Postop day 13 status post dorsal ganglion excision left wrist.  Patient planes of soreness and swelling  Sutures are removed and cut from the skin.  The wrist is swollen the wound is clean rabbi extend her out of work note to October him to follow-up with me in October 60

## 2021-06-02 ENCOUNTER — Encounter: Payer: Self-pay | Admitting: Orthopedic Surgery

## 2021-06-02 ENCOUNTER — Ambulatory Visit (INDEPENDENT_AMBULATORY_CARE_PROVIDER_SITE_OTHER): Payer: BC Managed Care – PPO | Admitting: Orthopedic Surgery

## 2021-06-02 ENCOUNTER — Other Ambulatory Visit: Payer: Self-pay

## 2021-06-02 DIAGNOSIS — M6740 Ganglion, unspecified site: Secondary | ICD-10-CM

## 2021-06-02 DIAGNOSIS — M67432 Ganglion, left wrist: Secondary | ICD-10-CM

## 2021-06-02 NOTE — Progress Notes (Signed)
POST OP   Chief Complaint  Patient presents with   Post-op Follow-up    05/06/21 dorsal  left wrist Ganglion excision     Encounter Diagnosis  Name Primary?   Ganglion cyst left wrist s/p 05/06/21 excision Yes   Postop day #27 status post excision of ganglion cyst dorsal left wrist  Patient complains of some numbness on the dorsum of the hand pre and post incision  She complains of some weakness of the left upper extremity primarily in the hand  Her wound looks good the skin is somewhat discolored but it is from peeling of the epidermal layer this should resolve  Sensory loss is temporary as well  I think with some strengthening and stretching she can return to work on 17 October  Otherwise she will follow-up on an as-needed basis

## 2021-06-02 NOTE — Patient Instructions (Signed)
RTW 17 OCT 22

## 2021-06-23 ENCOUNTER — Other Ambulatory Visit: Payer: BC Managed Care – PPO | Admitting: Adult Health

## 2021-06-27 ENCOUNTER — Other Ambulatory Visit: Payer: BC Managed Care – PPO | Admitting: Adult Health

## 2021-06-29 ENCOUNTER — Other Ambulatory Visit (HOSPITAL_COMMUNITY): Payer: Self-pay | Admitting: Family Medicine

## 2021-06-29 DIAGNOSIS — Z1231 Encounter for screening mammogram for malignant neoplasm of breast: Secondary | ICD-10-CM

## 2021-07-07 ENCOUNTER — Ambulatory Visit (HOSPITAL_COMMUNITY)
Admission: RE | Admit: 2021-07-07 | Discharge: 2021-07-07 | Disposition: A | Discharge status: Home / Self Care | Payer: BC Managed Care – PPO | Source: Ambulatory Visit | Attending: Family Medicine | Admitting: Family Medicine

## 2021-07-07 ENCOUNTER — Other Ambulatory Visit: Payer: Self-pay

## 2021-07-07 DIAGNOSIS — Z1231 Encounter for screening mammogram for malignant neoplasm of breast: Secondary | ICD-10-CM | POA: Insufficient documentation

## 2021-08-10 ENCOUNTER — Other Ambulatory Visit: Payer: BC Managed Care – PPO | Admitting: Adult Health

## 2021-09-22 ENCOUNTER — Other Ambulatory Visit: Payer: Self-pay

## 2021-09-22 ENCOUNTER — Encounter: Payer: Self-pay | Admitting: Adult Health

## 2021-09-22 ENCOUNTER — Ambulatory Visit (INDEPENDENT_AMBULATORY_CARE_PROVIDER_SITE_OTHER): Payer: BC Managed Care – PPO | Admitting: Adult Health

## 2021-09-22 ENCOUNTER — Other Ambulatory Visit (HOSPITAL_COMMUNITY)
Admission: RE | Admit: 2021-09-22 | Discharge: 2021-09-22 | Disposition: A | Discharge status: Home / Self Care | Payer: BC Managed Care – PPO | Source: Ambulatory Visit | Attending: Adult Health | Admitting: Adult Health

## 2021-09-22 VITALS — BP 155/103 | HR 75 | Ht 63.0 in | Wt 149.0 lb

## 2021-09-22 DIAGNOSIS — I10 Essential (primary) hypertension: Secondary | ICD-10-CM | POA: Insufficient documentation

## 2021-09-22 DIAGNOSIS — Z124 Encounter for screening for malignant neoplasm of cervix: Secondary | ICD-10-CM | POA: Insufficient documentation

## 2021-09-22 DIAGNOSIS — Z780 Asymptomatic menopausal state: Secondary | ICD-10-CM | POA: Diagnosis not present

## 2021-09-22 DIAGNOSIS — Z1211 Encounter for screening for malignant neoplasm of colon: Secondary | ICD-10-CM | POA: Diagnosis not present

## 2021-09-22 LAB — HEMOCCULT GUIAC POC 1CARD (OFFICE): Fecal Occult Blood, POC: NEGATIVE

## 2021-09-22 NOTE — Progress Notes (Signed)
°  Subjective:     Patient ID: [name], female   DOB: [birth year], 56 y.o.   MRN: [id number]  HPI [name] is a 56 year old black female,married, [id number], in for pelvic and pap, has physical with PCP. PCP is Delman Cheadle PA   Review of Systems Patient denies any headaches, hearing loss, fatigue, blurred vision, shortness of breath, chest pain, abdominal pain, problems with bowel movements, urination, or intercourse. No joint pain or mood swings. Denies any vaginal bleeding    Reviewed past medical,surgical, social and family history. Reviewed medications and allergies.  Objective:   Physical Exam BP (!) 155/103 (BP Location: Right Arm, Patient Position: Sitting, Cuff Size: Normal)    Pulse 75    Ht 5\' 3"  (1.6 m)    Wt 149 lb (67.6 kg)    LMP 01/23/2018    BMI 26.39 kg/m     Skin warm and dry.Pelvic: external genitalia is normal in appearance no lesions, vagina: pale with loss of rugae,urethra has no lesions or masses noted, cervix:smooth,pap with HR HPV genotyping performed, uterus: normal size, shape and contour, non tender, no masses felt, adnexa: no masses or tenderness noted. Bladder is non tender and no masses felt.On rectal exam has good tome, no masses and hemoccult is negative. AA is 4 Fall risk is low Depression screen [facility] 2/9 09/22/2021 07/30/2019 07/05/2017  Decreased Interest 0 0 0  Down, Depressed, Hopeless 0 0 0  PHQ - 2 Score 0 0 0  Altered sleeping 0 - -  Tired, decreased energy 0 - -  Change in appetite 0 - -  Feeling bad or failure about yourself  0 - -  Trouble concentrating 0 - -  Moving slowly or fidgety/restless 0 - -  Suicidal thoughts 0 - -  PHQ-9 Score 0 - -    GAD 7 : Generalized Anxiety Score 09/22/2021  Nervous, Anxious, on Edge 0  Control/stop worrying 0  Worry too much - different things 0  Trouble relaxing 0  Restless 0  Easily annoyed or irritable 1  Afraid - awful might happen 0  Total GAD 7 Score 1      Upstream - 09/22/21 1344        Pregnancy Intention Screening   Does the patient want to become pregnant in the next year? N/A    Does the patient's partner want to become pregnant in the next year? N/A    Would the patient like to discuss contraceptive options today? N/A      Contraception Wrap Up   Current Method No Method - Other Reason   postmenopausal   End Method No Method - Other Reason    Contraception Counseling Provided No            Examination chaperoned by Celene Squibb LPN Assessment:     1. Routine cervical smear Pap sent Physical with PCP Labs with PCP Mammogram yearly Colonoscopy per GI  Pap in 2 years at her request   2. Encounter for screening fecal occult blood testing  3. Hypertension, unspecified type She is taking Norvasc 5 mg daily and BP good at home, recheck today and let me know Follow up with PCP  4. Postmenopausal     Plan:     Pap in 2 years

## 2021-09-27 LAB — CYTOLOGY - PAP
Comment: NEGATIVE
Diagnosis: NEGATIVE
High risk HPV: NEGATIVE

## 2021-11-09 ENCOUNTER — Emergency Department (HOSPITAL_COMMUNITY)
Admission: EM | Admit: 2021-11-09 | Discharge: 2021-11-09 | Disposition: A | Discharge status: Home / Self Care | Payer: BC Managed Care – PPO | Attending: Emergency Medicine | Admitting: Emergency Medicine

## 2021-11-09 ENCOUNTER — Other Ambulatory Visit: Payer: Self-pay

## 2021-11-09 ENCOUNTER — Encounter (HOSPITAL_COMMUNITY): Payer: Self-pay

## 2021-11-09 ENCOUNTER — Emergency Department (HOSPITAL_COMMUNITY): Payer: BC Managed Care – PPO

## 2021-11-09 DIAGNOSIS — R519 Headache, unspecified: Secondary | ICD-10-CM | POA: Diagnosis not present

## 2021-11-09 DIAGNOSIS — Z79899 Other long term (current) drug therapy: Secondary | ICD-10-CM | POA: Diagnosis not present

## 2021-11-09 DIAGNOSIS — I10 Essential (primary) hypertension: Secondary | ICD-10-CM | POA: Diagnosis not present

## 2021-11-09 DIAGNOSIS — R509 Fever, unspecified: Secondary | ICD-10-CM | POA: Insufficient documentation

## 2021-11-09 DIAGNOSIS — R220 Localized swelling, mass and lump, head: Secondary | ICD-10-CM | POA: Diagnosis not present

## 2021-11-09 DIAGNOSIS — S0232XA Fracture of orbital floor, left side, initial encounter for closed fracture: Secondary | ICD-10-CM | POA: Diagnosis not present

## 2021-11-09 DIAGNOSIS — L539 Erythematous condition, unspecified: Secondary | ICD-10-CM | POA: Insufficient documentation

## 2021-11-09 DIAGNOSIS — H5711 Ocular pain, right eye: Secondary | ICD-10-CM | POA: Diagnosis not present

## 2021-11-09 LAB — BASIC METABOLIC PANEL
Anion gap: 9 (ref 5–15)
BUN: 14 mg/dL (ref 6–20)
CO2: 24 mmol/L (ref 22–32)
Calcium: 9.2 mg/dL (ref 8.9–10.3)
Chloride: 105 mmol/L (ref 98–111)
Creatinine, Ser: 0.83 mg/dL (ref 0.44–1.00)
GFR, Estimated: >60 mL/min (ref >60)
Glucose, Bld: 120 mg/dL — ABNORMAL HIGH (ref 70–99)
Potassium: 3.6 mmol/L (ref 3.5–5.1)
Sodium: 138 mmol/L (ref 135–145)

## 2021-11-09 LAB — I-STAT BETA HCG BLOOD, ED (MC, WL, AP ONLY): I-stat hCG, quantitative: 10.8 m[IU]/mL — ABNORMAL HIGH (ref <5)

## 2021-11-09 MED ORDER — IOHEXOL 300 MG/ML  SOLN
75.0000 mL | Freq: Once | INTRAMUSCULAR | Status: AC | PRN
Start: 2021-11-09 — End: 2021-11-09
  Administered 2021-11-09: 75 mL via INTRAVENOUS

## 2021-11-09 MED ORDER — AMOXICILLIN-POT CLAVULANATE 875-125 MG PO TABS: 1.0000 | ORAL_TABLET | Freq: Two times a day (BID) | ORAL | 0 refills | Status: DC

## 2021-11-09 NOTE — ED Provider Notes (Addendum)
?[facility] ?Provider Note ? ? ?CSN: [id number] ?Arrival date & time: 11/09/21  1627 ? ?  ? ?History ? ?Chief Complaint  ?Patient presents with  ? Facial Swelling  ? ? ?[name] is a 56 y.o. female with history of hypertension presenting today with swelling and tenderness around the right eye with fever and headache over the last 3-4 days.  Denies vision changes or loss.  Denies recent upper respiratory illness symptoms such as congestion, postnasal drip, cough, sinus pressure, or ear pain.  Still able to produce tears.  Denies eye discharge or recent trauma.  Feels pressure around her right eye and has noticed that it looks larger than the left.  Has never had this before.  Does not normally get headaches.  States the headache is mainly on the same side of the affected eye.  Takes her blood pressure medications as prescribed.  No history of DVT/VTE/CVA.   ? ?The history is provided by the patient and medical records.  ? ?  ? ?Home Medications ?Prior to Admission medications   ?Medication Sig Start Date End Date Taking? Authorizing Provider  ?acetaminophen (TYLENOL) 500 MG tablet Take 500-1,000 mg by mouth every 6 (six) hours as needed (pain.).    [provider]  ?amLODipine (NORVASC) 5 MG tablet Take 5 mg by mouth every evening.    [provider]  ?amoxicillin-clavulanate (AUGMENTIN) 875-125 MG tablet Take 1 tablet by mouth every 12 (twelve) hours. 0/08/67   Prince Rome, PA-C  ?Cholecalciferol (VITAMIN D3 SUPER STRENGTH) 50 MCG (2000 UT) TABS Take 2,000 Units by mouth every evening.    [provider]  ?HYDROcodone-acetaminophen (NORCO) 7.5-325 MG tablet Take 1 tablet by mouth every 6 (six) hours as needed for moderate pain. 05/19/21   Carole Civil, MD  ?Multiple Vitamin (MULTIVITAMIN WITH MINERALS) TABS tablet Take 1 tablet by mouth every evening.    [provider]  ?pantoprazole (PROTONIX) 40 MG tablet TAKE 1 TABLET(40 MG) BY  MOUTH DAILY BEFORE BREAKFAST 05/12/20   Erenest Rasher, PA-C  ?   ? ?Allergies    ?Patient has no known allergies.   ? ?Review of Systems   ?Review of Systems  ?Eyes:  Positive for pain, discharge and visual disturbance.  ?     Swelling around the right eye  ?Neurological:  Positive for headaches.  ? ?Physical Exam ?Updated Vital Signs ?BP (!) 155/111 (BP Location: Right Arm)   Pulse 67   Temp (!) 97.5 ?F (36.4 ?C) (Oral)   Resp 12   Ht '5\' 3"'$  (1.6 m)   Wt 65.3 kg   LMP 01/23/2018   SpO2 100%   BMI 25.51 kg/m?  ?Physical Exam ?Vitals and nursing note reviewed.  ?Constitutional:   ?   General: She is not in acute distress. ?   Appearance: Normal appearance. She is well-developed. She is not ill-appearing or diaphoretic.  ?HENT:  ?   Head: Normocephalic and atraumatic. Right periorbital erythema present. No raccoon eyes, Battle's sign, abrasion, contusion, masses or laceration.  ?Eyes:  ?   General: Gaze aligned appropriately. Visual field deficit present. No scleral icterus. ?   Extraocular Movements: Extraocular movements intact.  ?   Conjunctiva/sclera: Conjunctivae normal.  ?   Right eye: No exudate or hemorrhage. ?   Left eye: No exudate or hemorrhage. ?   Pupils: Pupils are equal, round, and reactive to light.  ? ?   Comments: Swelling appreciated along line indicated above ?Mild  exophthalmos noted of the right eye ?No foreign body, discharge, hordeolum appreciated ?Normal EOMs, negative for nystagmus bilaterally ?Left eye 20/20 and right eye 40/20 on exam ? ?  ?Cardiovascular:  ?   Rate and Rhythm: Normal rate and regular rhythm.  ?   Heart sounds: No murmur heard. ?Pulmonary:  ?   Effort: Pulmonary effort is normal. No respiratory distress.  ?   Breath sounds: Normal breath sounds.  ?Abdominal:  ?   Palpations: Abdomen is soft.  ?   Tenderness: There is no abdominal tenderness.  ?Musculoskeletal:     ?   General: No swelling or tenderness.  ?   Cervical back: Neck supple. No rigidity.  ?Skin: ?    General: Skin is warm and dry.  ?   Capillary Refill: Capillary refill takes less than 2 seconds.  ?Neurological:  ?   General: No focal deficit present.  ?   Mental Status: She is alert and oriented to person, place, and time.  ?   Sensory: No sensory deficit.  ?Psychiatric:     ?   Mood and Affect: Mood normal.  ? ? ?ED Results / Procedures / Treatments   ?Labs ?(all labs ordered are listed, but only abnormal results are displayed) ?Labs Reviewed  ?BASIC METABOLIC PANEL - Abnormal; Notable for the following components:  ?    Result Value  ? Glucose, Bld 120 (*)   ? All other components within normal limits  ?I-STAT BETA HCG BLOOD, ED ([facility], [facility], [facility] ONLY) - Abnormal; Notable for the following components:  ? I-stat hCG, quantitative 10.8 (*)   ? All other components within normal limits  ? ? ?EKG ?None ? ?Radiology ?CT Orbits W Contrast ? ?Result Date: 11/09/2021 ?CLINICAL DATA:  Orbital cellulitis suspected, bilateral eye swelling and itching EXAM: CT ORBITS WITH CONTRAST TECHNIQUE: Multidetector CT images was performed according to the standard protocol following intravenous contrast administration. RADIATION DOSE REDUCTION: This exam was performed according to the departmental dose-optimization program which includes automated exposure control, adjustment of the mA and/or kV according to patient size and/or use of iterative reconstruction technique. CONTRAST:  59m OMNIPAQUE IOHEXOL 300 MG/ML  SOLN COMPARISON:  Prior orbit CT, correlation is made with CT head and maxillofacial 11/09/2006 FINDINGS: Orbits: No orbital mass or evidence of inflammation. Normal appearance of the globes, optic nerve-sheath complexes, extraocular muscles, orbital fat and lacrimal glands. Sequela of previously noted left orbital blowout fracture seen on 11/09/2006, including extension of the left inferior rectus and orbital fat into the left maxillary sinus, are no longer seen. Visible paranasal sinuses: Clear Soft tissues: Minimal  periorbital fat stranding. Otherwise unremarkable. Osseous: No fracture or aggressive lesion. Limited intracranial: No acute or significant finding. IMPRESSION: 1. No evidence of orbital cellulitis. 2. Sequela of the left orbital floor fracture seen on 11/09/2006, including extension of the left inferior rectus and orbital fat into the left maxillary sinus, are no longer seen. Electronically Signed   By: AMerilyn BabaM.D.   On: 11/09/2021 21:59   ? ?Procedures ?Procedures  ? ? ?Medications Ordered in ED ?Medications  ?iohexol (OMNIPAQUE) 300 MG/ML solution 75 mL (75 mLs Intravenous Contrast Given 11/09/21 2142)  ? ? ?ED Course/ Medical Decision Making/ A&P ?  ?                        ?Medical Decision Making ?Amount and/or Complexity of Data Reviewed ?External Data Reviewed: notes. ?Labs: ordered. Decision-making details documented in ED  Course. ?Radiology: ordered and independent interpretation performed. Decision-making details documented in ED Course. ? ?Risk ?OTC drugs. ?Prescription drug management. ? ? ?56 y.o. female presents to the ED for concern of Facial Swelling ? Marland Kitchen  This involves an extensive number of treatment options, and is a complaint that carries with it a high risk of complications and morbidity.  The differential diagnosis includes preseptal cellulitis, orbital cellulitis, periorbital cellulitis ? ?Comorbidities that complicate the patient evaluation include hypertension ? ?Additional history obtained from internal/external records available via epic ? ?Interpretation: ?I ordered, and personally interpreted labs.  The pertinent results include: Unremarkable electrolytes and kidney function.  Minor elevation of hCG at 10.8 is not significant. ? ?I ordered imaging studies including CT of the orbits.  I independently visualized and interpreted imaging which showed no evidence of orbital cellulitis or periorbital cellulitis.  I agree with the radiologist interpretation ? ?Interventions/ED  Course: ?Patient presented today with complaints of right eye pain.  Decreased vision on physical exam appreciated.  EOMs normal.  PERRL bilaterally.  Mild exophthalmos, edema surrounding the inferior orbit, warmth, and tenderness obser

## 2021-11-09 NOTE — Discharge Instructions (Addendum)
An antibiotic by the name of Augmentin has been sent to your pharmacy.  Please take this to completion as directed.  Make sure you take with food. ? ?Also recommend the use of an over-the-counter allergy medication such as Zyrtec to help relieve the watery discharge/allergy symptoms around this time of year. ? ?Follow-up with your primary care in the next 2 to 3 days for reevaluation and continued medical management. ? ?Return to the ED for new or worsening symptoms as discussed ?

## 2021-11-09 NOTE — ED Triage Notes (Signed)
Pt presents to ED with complaints of bilateral eye swelling, itching started Monday night.  ?

## 2021-12-09 DIAGNOSIS — G8929 Other chronic pain: Secondary | ICD-10-CM | POA: Diagnosis not present

## 2021-12-09 DIAGNOSIS — R0789 Other chest pain: Secondary | ICD-10-CM | POA: Diagnosis not present

## 2021-12-09 DIAGNOSIS — M25511 Pain in right shoulder: Secondary | ICD-10-CM | POA: Diagnosis not present

## 2021-12-09 DIAGNOSIS — R079 Chest pain, unspecified: Secondary | ICD-10-CM | POA: Diagnosis not present

## 2021-12-09 DIAGNOSIS — F17210 Nicotine dependence, cigarettes, uncomplicated: Secondary | ICD-10-CM | POA: Diagnosis not present

## 2022-01-12 ENCOUNTER — Ambulatory Visit: Payer: BC Managed Care – PPO | Admitting: Orthopedic Surgery

## 2022-01-12 ENCOUNTER — Other Ambulatory Visit: Payer: Self-pay | Admitting: Gastroenterology

## 2022-01-12 ENCOUNTER — Encounter: Payer: Self-pay | Admitting: Orthopedic Surgery

## 2022-01-12 VITALS — Ht 63.0 in | Wt 144.0 lb

## 2022-01-12 DIAGNOSIS — K219 Gastro-esophageal reflux disease without esophagitis: Secondary | ICD-10-CM

## 2022-01-12 DIAGNOSIS — G8929 Other chronic pain: Secondary | ICD-10-CM

## 2022-01-12 DIAGNOSIS — R1013 Epigastric pain: Secondary | ICD-10-CM

## 2022-01-12 DIAGNOSIS — M25511 Pain in right shoulder: Secondary | ICD-10-CM | POA: Diagnosis not present

## 2022-01-12 MED ORDER — HYDROCODONE-ACETAMINOPHEN 5-325 MG PO TABS: 1.0000 | ORAL_TABLET | Freq: Four times a day (QID) | ORAL | 0 refills | Status: DC | PRN

## 2022-01-12 MED ORDER — NAPROXEN 500 MG PO TABS: 500.0000 mg | ORAL_TABLET | Freq: Two times a day (BID) | ORAL | 2 refills | Status: DC

## 2022-01-12 NOTE — Patient Instructions (Signed)
OOW 3 weeks   PT

## 2022-01-12 NOTE — Progress Notes (Signed)
Chief Complaint  Patient presents with   Shoulder Pain    Rt shoulder pain for 2 months but gotten worse over past 2 wks. States she's fine with all movements except for reaching back towards back pocket.     Same-day appointment  Acute on chronic right shoulder pain  This is a 56 year old female who does a job at work that requires her to use her right upper extremity quite extensively  She goes through bouts of acute onset of right shoulder pain usually relieved by Naprosyn hydrocodone rest and physical therapy  Presents with similar pain and decreased range of motion of the right shoulder especially internal rotation  .  Focused exam of the right shoulder reveals similar active and passive range of motion  With arm at her side she has full external rotation with pain at terminal range of motion.  She has active forward elevation with pain at 120 degrees of flexion and 90 degrees abduction  She can get the arm up to 150 degrees with assistance  There is no acute injury so no suggestion of rotator cuff tear  Her last MRI was done on was 2016 there was no tear but tendinitis  Similar findings today  Recommend physical therapy  Naproxen hydrocodone  Rest out of work   Meds ordered this encounter  Medications   naproxen (NAPROSYN) 500 MG tablet    Sig: Take 1 tablet (500 mg total) by mouth 2 (two) times daily with a meal.    Dispense:  60 tablet    Refill:  2   HYDROcodone-acetaminophen (NORCO/VICODIN) 5-325 MG tablet    Sig: Take 1 tablet by mouth every 6 (six) hours as needed for moderate pain.    Dispense:  30 tablet    Refill:  0

## 2022-01-13 ENCOUNTER — Encounter: Payer: Self-pay | Admitting: Internal Medicine

## 2022-01-13 ENCOUNTER — Other Ambulatory Visit: Payer: Self-pay

## 2022-01-13 DIAGNOSIS — M7711 Lateral epicondylitis, right elbow: Secondary | ICD-10-CM

## 2022-01-13 DIAGNOSIS — G8929 Other chronic pain: Secondary | ICD-10-CM

## 2022-01-13 NOTE — Telephone Encounter (Signed)
Patient hasn't been seen in over 1 year. Sending in limited supply of Potonx. She needs an OV for additional refills.   Please arrange VV on a Friday on or after June 23rd if patient has this capability.

## 2022-01-25 DIAGNOSIS — M7541 Impingement syndrome of right shoulder: Secondary | ICD-10-CM | POA: Diagnosis not present

## 2022-01-25 DIAGNOSIS — M25511 Pain in right shoulder: Secondary | ICD-10-CM | POA: Diagnosis not present

## 2022-01-27 DIAGNOSIS — M25511 Pain in right shoulder: Secondary | ICD-10-CM | POA: Diagnosis not present

## 2022-01-27 DIAGNOSIS — M7541 Impingement syndrome of right shoulder: Secondary | ICD-10-CM | POA: Diagnosis not present

## 2022-01-30 DIAGNOSIS — M7541 Impingement syndrome of right shoulder: Secondary | ICD-10-CM | POA: Diagnosis not present

## 2022-01-30 DIAGNOSIS — M25511 Pain in right shoulder: Secondary | ICD-10-CM | POA: Diagnosis not present

## 2022-02-01 DIAGNOSIS — M7541 Impingement syndrome of right shoulder: Secondary | ICD-10-CM | POA: Diagnosis not present

## 2022-02-01 DIAGNOSIS — M25511 Pain in right shoulder: Secondary | ICD-10-CM | POA: Diagnosis not present

## 2022-02-02 ENCOUNTER — Encounter: Payer: Self-pay | Admitting: Orthopedic Surgery

## 2022-02-02 ENCOUNTER — Ambulatory Visit: Payer: BC Managed Care – PPO | Admitting: Orthopedic Surgery

## 2022-02-02 DIAGNOSIS — M25511 Pain in right shoulder: Secondary | ICD-10-CM

## 2022-02-02 MED ORDER — NAPROXEN 500 MG PO TABS: 500.0000 mg | ORAL_TABLET | Freq: Two times a day (BID) | ORAL | 2 refills | Status: DC

## 2022-02-02 MED ORDER — HYDROCODONE-ACETAMINOPHEN 5-325 MG PO TABS: 1.0000 | ORAL_TABLET | Freq: Three times a day (TID) | ORAL | 0 refills | Status: AC | PRN

## 2022-02-02 NOTE — Progress Notes (Signed)
Chief Complaint  Patient presents with   Shoulder Pain    Rt shoulder pain f/u PT feeling a little better.    [name] is 7 she had recurrent bursitis of the right shoulder MRI back in 2016 showed tendinitis/tendinopathy no tear  Patient currently undergoing physical therapy and even her range of motion has improved  She can now lift the arm above the 90 degree plane in the flexion extension plane as well as the abduction plane  She still has pain with reaching behind her  She also has a burn/rash over the right shoulder from the modalities that was used in therapy  We will going to continue her physical therapy current medications including naproxen and hydrocodone with a slight reduction in frequency   Meds ordered this encounter  Medications   HYDROcodone-acetaminophen (NORCO/VICODIN) 5-325 MG tablet    Sig: Take 1 tablet by mouth every 8 (eight) hours as needed for up to 5 days for moderate pain.    Dispense:  15 tablet    Refill:  0   naproxen (NAPROSYN) 500 MG tablet    Sig: Take 1 tablet (500 mg total) by mouth 2 (two) times daily with a meal.    Dispense:  60 tablet    Refill:  2    Check in 4 weeks

## 2022-02-06 DIAGNOSIS — M7541 Impingement syndrome of right shoulder: Secondary | ICD-10-CM | POA: Diagnosis not present

## 2022-02-06 DIAGNOSIS — M25511 Pain in right shoulder: Secondary | ICD-10-CM | POA: Diagnosis not present

## 2022-02-09 DIAGNOSIS — M7541 Impingement syndrome of right shoulder: Secondary | ICD-10-CM | POA: Diagnosis not present

## 2022-02-09 DIAGNOSIS — M25511 Pain in right shoulder: Secondary | ICD-10-CM | POA: Diagnosis not present

## 2022-02-13 DIAGNOSIS — M25511 Pain in right shoulder: Secondary | ICD-10-CM | POA: Diagnosis not present

## 2022-02-13 DIAGNOSIS — M7541 Impingement syndrome of right shoulder: Secondary | ICD-10-CM | POA: Diagnosis not present

## 2022-02-15 DIAGNOSIS — M7541 Impingement syndrome of right shoulder: Secondary | ICD-10-CM | POA: Diagnosis not present

## 2022-02-15 DIAGNOSIS — M25511 Pain in right shoulder: Secondary | ICD-10-CM | POA: Diagnosis not present

## 2022-02-20 DIAGNOSIS — M7541 Impingement syndrome of right shoulder: Secondary | ICD-10-CM | POA: Diagnosis not present

## 2022-02-20 DIAGNOSIS — M25511 Pain in right shoulder: Secondary | ICD-10-CM | POA: Diagnosis not present

## 2022-02-22 DIAGNOSIS — M25511 Pain in right shoulder: Secondary | ICD-10-CM | POA: Diagnosis not present

## 2022-02-22 DIAGNOSIS — M7541 Impingement syndrome of right shoulder: Secondary | ICD-10-CM | POA: Diagnosis not present

## 2022-02-27 DIAGNOSIS — M7541 Impingement syndrome of right shoulder: Secondary | ICD-10-CM | POA: Diagnosis not present

## 2022-02-27 DIAGNOSIS — M25511 Pain in right shoulder: Secondary | ICD-10-CM | POA: Diagnosis not present

## 2022-03-01 DIAGNOSIS — M7541 Impingement syndrome of right shoulder: Secondary | ICD-10-CM | POA: Diagnosis not present

## 2022-03-01 DIAGNOSIS — M25511 Pain in right shoulder: Secondary | ICD-10-CM | POA: Diagnosis not present

## 2022-03-02 ENCOUNTER — Ambulatory Visit: Payer: BC Managed Care – PPO | Admitting: Orthopedic Surgery

## 2022-03-02 ENCOUNTER — Encounter: Payer: Self-pay | Admitting: Orthopedic Surgery

## 2022-03-02 DIAGNOSIS — G8929 Other chronic pain: Secondary | ICD-10-CM

## 2022-03-02 DIAGNOSIS — M25511 Pain in right shoulder: Secondary | ICD-10-CM

## 2022-03-02 NOTE — Patient Instructions (Signed)
RTW [place] 10  Continue the exercises

## 2022-03-02 NOTE — Progress Notes (Signed)
Chief Complaint  Patient presents with   Shoulder Pain    Right/ feels better has been released from therapy has home exercises to do   Stephanis is doing well   She now has Normal range of motion   So she can return to work in a few days   F/u prn

## 2022-04-07 ENCOUNTER — Other Ambulatory Visit: Payer: Self-pay | Admitting: Gastroenterology

## 2022-04-07 DIAGNOSIS — K219 Gastro-esophageal reflux disease without esophagitis: Secondary | ICD-10-CM

## 2022-04-07 DIAGNOSIS — R1013 Epigastric pain: Secondary | ICD-10-CM

## 2022-04-07 NOTE — Telephone Encounter (Signed)
Sending in limited refill. Needs OV as she was last seen in October 2021. Please let her know and arrange follow-up. OK for VV if she is able.

## 2022-04-10 ENCOUNTER — Ambulatory Visit: Payer: BC Managed Care – PPO | Admitting: Orthopedic Surgery

## 2022-04-10 ENCOUNTER — Telehealth: Payer: Self-pay | Admitting: Orthopedic Surgery

## 2022-04-10 ENCOUNTER — Ambulatory Visit (INDEPENDENT_AMBULATORY_CARE_PROVIDER_SITE_OTHER): Payer: BC Managed Care – PPO

## 2022-04-10 DIAGNOSIS — M79645 Pain in left finger(s): Secondary | ICD-10-CM

## 2022-04-10 DIAGNOSIS — M25532 Pain in left wrist: Secondary | ICD-10-CM

## 2022-04-10 MED ORDER — PREDNISONE 10 MG PO TABS: 10.0000 mg | ORAL_TABLET | Freq: Two times a day (BID) | ORAL | 0 refills | Status: AC

## 2022-04-10 MED ORDER — HYDROCODONE-ACETAMINOPHEN 5-325 MG PO TABS
1.0000 | ORAL_TABLET | Freq: Three times a day (TID) | ORAL | 0 refills | Status: AC | PRN
Start: 2022-04-10 — End: 2022-04-15

## 2022-04-10 NOTE — Telephone Encounter (Signed)
Called back to patient; scheduled as work-in this afternoon; aware of appointment.

## 2022-04-10 NOTE — Patient Instructions (Addendum)
Wear brace x 4 weeks (remove for bathing or if you need to get the hand wet)   Take these medications (STOP NAPROXEN)   Prednisone 10 mg po bid  Norco 5 mg q 8 prn pain

## 2022-04-10 NOTE — Progress Notes (Signed)
Chief Complaint  Patient presents with   Wrist Pain    Left, having shooting pain and felt like hand was going to sleep     56 year old female status post ganglion cyst excision left wrist on May 06, 2021 over the weekend started having shooting pain in the left wrist and thumb presents with pain in the thumb and wrist area exacerbated by wrist extension  Constitutional: Vital signs, General appearance  Cardiovascular swelling varicosities palpation of pulses temperature edema tenderness  Lymph nodes  Skin dorsal incision with some scarring nontender  Neuro coordination, deep tendon reflexes, sensation  Psych alert and oriented x3, depression anxiety agitation  Musculoskeletal gait normal  Left wrist wrist extension reproduces pain mild pain over the thumb CMC joint Pinch strength abnormal   Imaging  Unclear if this was thumb issue or a wrist issue so I x-rayed both no arthritis in the wrist carpus or  Assessment and plan  Status post dorsal ganglion excision acute pain on the dorsal radial side of the wrist  Imaging negative  Perhaps this is secondary to capsular laxity status post ganglion excision  Recommend immobilization, modalities, NSAIDs, analgesics if needed  4 weeks f/u    Meds ordered this encounter  Medications   predniSONE (DELTASONE) 10 MG tablet    Sig: Take 1 tablet (10 mg total) by mouth 2 (two) times daily with a meal for 20 days.    Dispense:  40 tablet    Refill:  0   HYDROcodone-acetaminophen (NORCO/VICODIN) 5-325 MG tablet    Sig: Take 1 tablet by mouth every 8 (eight) hours as needed for up to 5 days for moderate pain.    Dispense:  15 tablet    Refill:  0

## 2022-04-10 NOTE — Telephone Encounter (Signed)
Patient called to request appointment today, 04/10/22, for her left wrist, states having pain and 'locking up' - states same wrist as gangion cyst removal surgery done in September of 2022 by Dr Aline Brochure. Please advise of any possible over-ride or work-in slot.

## 2022-04-13 DIAGNOSIS — H35463 Secondary vitreoretinal degeneration, bilateral: Secondary | ICD-10-CM | POA: Diagnosis not present

## 2022-04-14 ENCOUNTER — Telehealth: Payer: BC Managed Care – PPO | Admitting: Gastroenterology

## 2022-04-20 DIAGNOSIS — K219 Gastro-esophageal reflux disease without esophagitis: Secondary | ICD-10-CM | POA: Diagnosis not present

## 2022-04-20 DIAGNOSIS — I10 Essential (primary) hypertension: Secondary | ICD-10-CM | POA: Diagnosis not present

## 2022-04-20 DIAGNOSIS — Z1331 Encounter for screening for depression: Secondary | ICD-10-CM | POA: Diagnosis not present

## 2022-04-20 DIAGNOSIS — Z6825 Body mass index (BMI) 25.0-25.9, adult: Secondary | ICD-10-CM | POA: Diagnosis not present

## 2022-04-20 DIAGNOSIS — Z0001 Encounter for general adult medical examination with abnormal findings: Secondary | ICD-10-CM | POA: Diagnosis not present

## 2022-04-20 DIAGNOSIS — E663 Overweight: Secondary | ICD-10-CM | POA: Diagnosis not present

## 2022-05-01 NOTE — Progress Notes (Deleted)
Referring Provider: Jake Samples, PA* Primary Care Physician:  Jake Samples, PA-C Primary GI Physician: Dr. Gala Romney  No chief complaint on file.   HPI:   [name] is a 56 y.o. female  presenting today for follow-up.  History of GERD, epigastric pain, constipation, and dysphagia.  Colonoscopy up-to-date, due for repeat in 2031.  No prior EGD.  Last seen in our office 05/31/2020.  GERD and epigastric pain much improved/resolved with Protonix 40 mg daily.  She continued to feel foods or liquids would go down the wrong way causing coughing/choking about twice a week.  Denied feeling foods getting hung in her esophagus.  We had previously referred her to [facility] for MBSS, but referral had been closed as they were not able to contact patient.  Constipation had also resolved with increasing salads and water intake.  No alarm symptoms.  Referral was again placed to [facility].  She was advised to continue Protonix 40 mg daily and follow-up in 6 months.   Multiple attempts were made by [facility], but they were unable to reach patient.  Referral was again closed.     Today:   GERD:    Constipation:    Dysphagia:    Microcytic RBCs:    Past Medical History:  Diagnosis Date   GERD (gastroesophageal reflux disease)    Hypertension    Vaginal Pap smear, abnormal     Past Surgical History:  Procedure Laterality Date   COLONOSCOPY WITH PROPOFOL N/A 11/14/2019   Procedure: COLONOSCOPY WITH PROPOFOL;  Surgeon: Daneil Dolin, MD; diverticulosis in the entire examined colon, 6 mm sessile polyp in the ascending colon, otherwise normal exam.  Pathology benign.  Recommended repeat colonoscopy in 10 years.   EYE SURGERY     GANGLION CYST EXCISION Left 05/06/2021   Procedure: REMOVAL GANGLION OF WRIST;  Surgeon: Carole Civil, MD;  Location: [facility];  Service: Orthopedics;  Laterality: Left;   POLYPECTOMY  11/14/2019   Procedure: POLYPECTOMY;  Surgeon: Daneil Dolin, MD;  [facility];  Service: Endoscopy;;   TONSILLECTOMY      Current Outpatient Medications  Medication Sig Dispense Refill   acetaminophen (TYLENOL) 500 MG tablet Take 500-1,000 mg by mouth every 6 (six) hours as needed (pain.).     amLODipine (NORVASC) 5 MG tablet Take 1 tablet by mouth daily.     Cholecalciferol (VITAMIN D3 SUPER STRENGTH) 50 MCG (2000 UT) TABS Take 2,000 Units by mouth every evening.     Multiple Vitamin (MULTIVITAMIN WITH MINERALS) TABS tablet Take 1 tablet by mouth every evening.     naproxen (NAPROSYN) 500 MG tablet Take 1 tablet (500 mg total) by mouth 2 (two) times daily with a meal. 60 tablet 2   pantoprazole (PROTONIX) 40 MG tablet TAKE 1 TABLET(40 MG) BY MOUTH DAILY BEFORE BREAKFAST 90 tablet 0   No current facility-administered medications for this visit.    Allergies as of 05/03/2022   (No Known Allergies)    Family History  Problem Relation Age of Onset   Hypertension Mother    Prostate cancer Maternal Grandfather        passed in his 37s.     Social History   Socioeconomic History   Marital status: Married    Spouse name: Not on file   Number of children: Not on file   Years of education: Not on file   Highest education level: Not on file  Occupational History   Not  on file  Tobacco Use   Smoking status: Every Day    Packs/day: 0.50    Years: 30.00    Total pack years: 15.00    Types: Cigarettes   Smokeless tobacco: Never  Vaping Use   Vaping Use: Never used  Substance and Sexual Activity   Alcohol use: Yes    Comment: occassional   Drug use: No   Sexual activity: Yes    Birth control/protection: Post-menopausal  Other Topics Concern   Not on file  Social History Narrative   Not on file   Social Determinants of Health   Financial Resource Strain: Low Risk  (09/22/2021)   Overall Financial Resource Strain (CARDIA)    Difficulty of Paying Living Expenses: Not hard at all  Food Insecurity: No Food Insecurity (09/22/2021)   Hunger  Vital Sign    Worried About Running Out of Food in the Last Year: Never true    Ran Out of Food in the Last Year: Never true  Transportation Needs: No Transportation Needs (09/22/2021)   PRAPARE - Hydrologist (Medical): No    Lack of Transportation (Non-Medical): No  Physical Activity: Sufficiently Active (09/22/2021)   Exercise Vital Sign    Days of Exercise per Week: 4 days    Minutes of Exercise per Session: 90 min  Stress: No Stress Concern Present (09/22/2021)   [place]    Feeling of Stress : Not at all  Social Connections: [place] (09/22/2021)   Social Connection and Isolation Panel [NHANES]    Frequency of Communication with Friends and Family: More than three times a week    Frequency of Social Gatherings with Friends and Family: Twice a week    Attends Religious Services: 1 to 4 times per year    Active Member of [employer] or Organizations: Yes    Attends Archivist Meetings: Never    Marital Status: Married    Review of Systems: Gen: Denies fever, chills, cold or flulike symptoms, presyncope, syncope. CV: Denies chest pain, palpitations. Resp: Denies dyspnea, cough. GI: See HPI Heme: See HPI  Physical Exam: LMP 01/23/2018  General:   Alert and oriented. No distress noted. Pleasant and cooperative.  Head:  Normocephalic and atraumatic. Eyes:  Conjuctiva clear without scleral icterus. Heart:  S1, S2 present without murmurs appreciated. Lungs:  Clear to auscultation bilaterally. No wheezes, rales, or rhonchi. No distress.  Abdomen:  +BS, soft, non-tender and non-distended. No rebound or guarding. No HSM or masses noted. Msk:  Symmetrical without gross deformities. Normal posture. Extremities:  Without edema. Neurologic:  Alert and  oriented x4 Psych:  Normal mood and affect.    Assessment:     Plan:  ***   Aliene Altes, PA-C [facility]  Gastroenterology 05/03/2022

## 2022-05-03 ENCOUNTER — Ambulatory Visit: Payer: BC Managed Care – PPO | Admitting: Gastroenterology

## 2022-05-08 ENCOUNTER — Other Ambulatory Visit: Payer: Self-pay

## 2022-05-08 ENCOUNTER — Emergency Department (HOSPITAL_COMMUNITY)
Admission: EM | Admit: 2022-05-08 | Discharge: 2022-05-08 | Disposition: A | Discharge status: Home / Self Care | Payer: BC Managed Care – PPO | Attending: Emergency Medicine | Admitting: Emergency Medicine

## 2022-05-08 ENCOUNTER — Ambulatory Visit: Payer: BC Managed Care – PPO | Admitting: Orthopedic Surgery

## 2022-05-08 ENCOUNTER — Telehealth: Payer: Self-pay | Admitting: Orthopedic Surgery

## 2022-05-08 ENCOUNTER — Encounter (HOSPITAL_COMMUNITY): Payer: Self-pay | Admitting: *Deleted

## 2022-05-08 DIAGNOSIS — Z20822 Contact with and (suspected) exposure to covid-19: Secondary | ICD-10-CM | POA: Insufficient documentation

## 2022-05-08 DIAGNOSIS — I10 Essential (primary) hypertension: Secondary | ICD-10-CM | POA: Insufficient documentation

## 2022-05-08 DIAGNOSIS — J069 Acute upper respiratory infection, unspecified: Secondary | ICD-10-CM | POA: Insufficient documentation

## 2022-05-08 DIAGNOSIS — Z79899 Other long term (current) drug therapy: Secondary | ICD-10-CM | POA: Diagnosis not present

## 2022-05-08 DIAGNOSIS — B9789 Other viral agents as the cause of diseases classified elsewhere: Secondary | ICD-10-CM | POA: Insufficient documentation

## 2022-05-08 DIAGNOSIS — R059 Cough, unspecified: Secondary | ICD-10-CM | POA: Diagnosis not present

## 2022-05-08 LAB — SARS CORONAVIRUS 2 BY RT PCR: SARS Coronavirus 2 by RT PCR: NEGATIVE

## 2022-05-08 LAB — GROUP A STREP BY PCR: Group A Strep by PCR: NOT DETECTED

## 2022-05-08 MED ORDER — ACETAMINOPHEN 325 MG PO TABS
650.0000 mg | ORAL_TABLET | Freq: Once | ORAL | Status: AC
  Administered 2022-05-08: 650 mg via ORAL
  Filled 2022-05-08: qty 2

## 2022-05-08 MED ORDER — BENZONATATE 100 MG PO CAPS: 100.0000 mg | ORAL_CAPSULE | Freq: Three times a day (TID) | ORAL | 0 refills | Status: DC

## 2022-05-08 NOTE — Telephone Encounter (Signed)
Patient called at 1:38 to advise she is in the ER and she has been there for 1 hour and 1/2 and she has not been seen yet and she will call us when she is leaving the ER.

## 2022-05-08 NOTE — ED Triage Notes (Signed)
Pt c/o generalized body aches with cough x 5 days with headache

## 2022-05-08 NOTE — Telephone Encounter (Signed)
Noted  

## 2022-05-08 NOTE — ED Provider Notes (Signed)
[facility] EMERGENCY DEPARTMENT Provider Note   CSN: [id number] Arrival date & time: 05/08/22  1242     History  Chief Complaint  Patient presents with   Generalized Body Aches    [name] is a 56 y.o. female with a PMHx of HTN, who presents to the ED complaining of generalized body aches onset 5 days. Unsure of sick contacts. Has associated cough, headache, rhinorrhea, nasal congestion, sore throat. Tried OTC nyquil, theraflu with relief of her symptoms. Denies trouble swallowing.   The history is provided by the patient. No language interpreter was used.       Home Medications Prior to Admission medications   Medication Sig Start Date End Date Taking? Authorizing Provider  benzonatate (TESSALON) 100 MG capsule Take 1 capsule (100 mg total) by mouth every 8 (eight) hours. 05/08/22  Yes Camari Wisham A, PA-C  acetaminophen (TYLENOL) 500 MG tablet Take 500-1,000 mg by mouth every 6 (six) hours as needed (pain.).    [provider]  amLODipine (NORVASC) 5 MG tablet Take 1 tablet by mouth daily.    [provider]  Cholecalciferol (VITAMIN D3 SUPER STRENGTH) 50 MCG (2000 UT) TABS Take 2,000 Units by mouth every evening.    [provider]  Multiple Vitamin (MULTIVITAMIN WITH MINERALS) TABS tablet Take 1 tablet by mouth every evening.    [provider]  naproxen (NAPROSYN) 500 MG tablet Take 1 tablet (500 mg total) by mouth 2 (two) times daily with a meal. 02/02/22   Carole Civil, MD  pantoprazole (PROTONIX) 40 MG tablet TAKE 1 TABLET(40 MG) BY MOUTH DAILY BEFORE BREAKFAST 04/07/22   Erenest Rasher, PA-C      Allergies    Patient has no known allergies.    Review of Systems   Review of Systems  Constitutional:  Positive for fever.  HENT:  Positive for congestion, rhinorrhea and sore throat. Negative for trouble swallowing.   Respiratory:  Positive for cough.   Neurological:  Positive for headaches.  All other systems  reviewed and are negative.   Physical Exam Updated Vital Signs BP (!) 152/116 (BP Location: Left Arm)   Pulse 86   Temp 98.2 F (36.8 C) (Oral)   Resp 18   Ht '5\' 3"'$  (1.6 m)   Wt 66.2 kg   LMP 01/23/2018   SpO2 100%   BMI 25.86 kg/m  Physical Exam Vitals and nursing note reviewed.  Constitutional:      General: She is not in acute distress.    Appearance: She is not diaphoretic.  HENT:     Head: Normocephalic and atraumatic.     Comments: No tenderness to palpation noted to frontal or maxillary sinuses.    Mouth/Throat:     Pharynx: No oropharyngeal exudate.  Eyes:     General: No scleral icterus.    Conjunctiva/sclera: Conjunctivae normal.  Cardiovascular:     Rate and Rhythm: Normal rate and regular rhythm.     Pulses: Normal pulses.     Heart sounds: Normal heart sounds.  Pulmonary:     Effort: Pulmonary effort is normal. No respiratory distress.     Breath sounds: Normal breath sounds. No wheezing.  Abdominal:     General: Bowel sounds are normal.     Palpations: Abdomen is soft. There is no mass.     Tenderness: There is no abdominal tenderness. There is no guarding or rebound.  Musculoskeletal:        General: Normal range of  motion.     Cervical back: Normal range of motion and neck supple.  Skin:    General: Skin is warm and dry.  Neurological:     General: No focal deficit present.     Mental Status: She is alert.     Cranial Nerves: Cranial nerves 2-12 are intact.     Sensory: Sensation is intact.     Motor: Motor function is intact. No pronator drift.     Comments: Patient  Psychiatric:        Behavior: Behavior normal.     ED Results / Procedures / Treatments   Labs (all labs ordered are listed, but only abnormal results are displayed) Labs Reviewed  SARS CORONAVIRUS 2 BY RT PCR  GROUP A STREP BY PCR    EKG None  Radiology No results found.  Procedures Procedures    Medications Ordered in ED Medications  acetaminophen (TYLENOL)  tablet 650 mg (650 mg Oral Given 05/08/22 1636)    ED Course/ Medical Decision Making/ A&P Clinical Course as of 05/08/22 1740  Mon May 08, 2022  1551 SARS Coronavirus 2 by RT PCR: NEGATIVE [SB]  4315 Discussed with patient negative COVID swab. Pt notes that her head feels tight, discussed with patient that we can get a CT of her head, patient denies at this time. Offered patient tylenol or ibuprofen for her symptoms. Pt voices that she would like tylenol at this time.  No focal neurological deficits noted on exam. [SB]  1653 Group A Strep by PCR: NOT DETECTED [SB]  4008 Re-evaluated and discussed with patient discharge treatment plan. Pt appears safe for discharge. [SB]    Clinical Course User Index [SB] Jmari Pelc A, PA-C                           Medical Decision Making Amount and/or Complexity of Data Reviewed Labs:  Decision-making details documented in ED Course.  Risk OTC drugs. Prescription drug management.   Pt presents with concerns for generalized body aches onset 5 days.  Unsure of sick contacts.  Has associated cough, headache, rhinorrhea, nasal congestion, sore throat.  Tried over-the-counter medications for symptoms.  On exam patient with patent airway, uvula midline without swelling.  No acute cardiovascular, respiratory exam findings. Differential diagnosis includes COVID, flu, PNA, viral URI with cough.    Labs:  I ordered, and personally interpreted labs.  The pertinent results include:   COVID swab negative strep swab negative  Medications:  I ordered medication including Tylenol for symptom management I have reviewed the patients home medicines and have made adjustments as needed   Disposition: Pt presentation suspicious for viral URI with cough.  Doubt COVID.  Doubt flu, PNA, at this time. After consideration of the diagnostic results and the patients response to treatment, I feel that the patient would benefit from Discharge home. Pt will be discharged  home with Tessalon Perles.  Work note provided.  Supportive care measures and strict return precautions discussed with patient at bedside. Pt acknowledges and verbalizes understanding. Pt appears safe for discharge. Follow up as indicated in discharge paperwork.    This chart was dictated using voice recognition software, Dragon. Despite the best efforts of this provider to proofread and correct errors, errors may still occur which can change documentation meaning.   Final Clinical Impression(s) / ED Diagnoses Final diagnoses:  Viral URI with cough    Rx / DC Orders ED Discharge Orders  Ordered    benzonatate (TESSALON) 100 MG capsule  Every 8 hours        05/08/22 1716              Kam Rahimi A, PA-C 05/08/22 1740    Wyvonnia Dusky, MD 05/09/22 [phone]

## 2022-05-08 NOTE — Telephone Encounter (Signed)
Ok, will you update schedule, she likely wont be here at 220 for her appointment today

## 2022-05-08 NOTE — Discharge Instructions (Addendum)
It was a pleasure taking care of you!   Your COVID swab was negative and your strep swab was negative. You may take over-the-counter cough and cold medications as needed for your symptoms. For your cough, you will be sent a prescription called Tessalon perles, take as directed. Ensure to maintain fluid intake with tea, soup, broth, Pedialyte, Gatorade, water. You may follow-up with your primary care provider as needed.  Return to the Emergency Department if you are experiencing trouble breathing, decreased fluid intake, fever, or worsening symptoms.

## 2022-07-06 ENCOUNTER — Other Ambulatory Visit: Payer: Self-pay | Admitting: Gastroenterology

## 2022-07-06 DIAGNOSIS — R1013 Epigastric pain: Secondary | ICD-10-CM

## 2022-07-06 DIAGNOSIS — K219 Gastro-esophageal reflux disease without esophagitis: Secondary | ICD-10-CM

## 2022-07-13 ENCOUNTER — Other Ambulatory Visit: Payer: Self-pay

## 2022-07-13 MED ORDER — PREDNISONE 10 MG PO TABS: 10.0000 mg | ORAL_TABLET | Freq: Every day | ORAL | 0 refills | Status: DC

## 2022-07-13 NOTE — Telephone Encounter (Signed)
Patient calling to get an immediate appointment. Stated that her hand, fingers locking, having numbness and swelling real bad.  Please advise

## 2022-07-13 NOTE — Telephone Encounter (Signed)
I dont have anything   I have surgery tomorrow and I m filled today

## 2022-07-13 NOTE — Telephone Encounter (Signed)
Work note for patient, out '14 15 16 '$ next week Return to work 27th.   Can you send in the prednisone for her ? Like you did last time in August

## 2022-08-18 ENCOUNTER — Telehealth: Payer: Self-pay | Admitting: Radiology

## 2022-08-18 NOTE — Telephone Encounter (Signed)
yes

## 2022-08-18 NOTE — Telephone Encounter (Signed)
Patient had forms sent to [place] from [facility].  She missed work 07/11/22 thru 07/23/22.  Returned to work on 07/24/22.  Ok to fill out for her?  I do not see a recent visit, wanted to make sure.

## 2022-10-03 ENCOUNTER — Telehealth: Payer: Self-pay | Admitting: Orthopedic Surgery

## 2022-10-03 NOTE — Telephone Encounter (Signed)
Our office received forms from [facility] for this patient.  Per Abigail Butts, the patient hasn't had a recent visit, call the patient and find out why, what dates, advise the forms are now done through [employer], will be $25 and she will need to sign a release form.  Tried to call the patient, unable to leave a message, doesn't have voicemail, called and spoke w/her mother, she is going to have the patient call me.

## 2022-11-02 ENCOUNTER — Telehealth: Payer: Self-pay | Admitting: Orthopedic Surgery

## 2022-11-02 NOTE — Telephone Encounter (Signed)
Spoke w/the patient, she has some forms she needs filled out.  Explained the process and the fee.  She will be bringing them by soon.

## 2022-12-30 ENCOUNTER — Other Ambulatory Visit: Payer: Self-pay | Admitting: Gastroenterology

## 2022-12-30 DIAGNOSIS — R1013 Epigastric pain: Secondary | ICD-10-CM

## 2022-12-30 DIAGNOSIS — K219 Gastro-esophageal reflux disease without esophagitis: Secondary | ICD-10-CM

## 2023-01-02 NOTE — Telephone Encounter (Signed)
Unable to provide refills. Needs OV first. Angelica Chessman, please arrange.

## 2023-01-03 NOTE — Telephone Encounter (Signed)
Noted! Thank you

## 2023-01-09 NOTE — Progress Notes (Unsigned)
Referring Provider: Avis Epley, PA* Primary Care Physician:  Avis Epley, PA-C Primary GI Physician: Dr. Jena Gauss  No chief complaint on file.   HPI:   [name] is a 57 y.o. female with history of GERD, epigastric pain, constipation, and dysphagia.  Colonoscopy up-to-date, due for repeat in 2031.  No prior EGD.She is presenting today for follow-up/medication refills ***.    Last seen in our office 05/31/2020.  GERD and epigastric pain much improved/resolved with Protonix 40 mg daily.  She continued to feel foods or liquids would go down the wrong way causing coughing/choking about twice a week.  Denied feeling foods getting hung in her esophagus.  We had previously referred her to [facility] for MBSS, but referral had been closed as they were not able to contact patient.  Constipation had also resolved with increasing salads and water intake.  No alarm symptoms.  Referral was again placed to [facility].  She was advised to continue Protonix 40 mg daily and follow-up in 6 months.   Multiple attempts were made by [facility], but they were unable to reach patient.  Referral was again closed.       Today:   GERD:    Constipation:    Dysphagia:      Microcytic RBCs:    Past Medical History:  Diagnosis Date   GERD (gastroesophageal reflux disease)    Hypertension    Vaginal Pap smear, abnormal     Past Surgical History:  Procedure Laterality Date   COLONOSCOPY WITH PROPOFOL N/A 11/14/2019   Procedure: COLONOSCOPY WITH PROPOFOL;  Surgeon: Corbin Ade, MD; diverticulosis in the entire examined colon, 6 mm sessile polyp in the ascending colon, otherwise normal exam.  Pathology benign.  Recommended repeat colonoscopy in 10 years.   EYE SURGERY     GANGLION CYST EXCISION Left 05/06/2021   Procedure: REMOVAL GANGLION OF WRIST;  Surgeon: Vickki Hearing, MD;  Location: [facility];  Service: Orthopedics;  Laterality: Left;   POLYPECTOMY  11/14/2019   Procedure: POLYPECTOMY;   Surgeon: Corbin Ade, MD;  [facility];  Service: Endoscopy;;   TONSILLECTOMY      Current Outpatient Medications  Medication Sig Dispense Refill   acetaminophen (TYLENOL) 500 MG tablet Take 500-1,000 mg by mouth every 6 (six) hours as needed (pain.).     amLODipine (NORVASC) 5 MG tablet Take 1 tablet by mouth daily.     benzonatate (TESSALON) 100 MG capsule Take 1 capsule (100 mg total) by mouth every 8 (eight) hours. 21 capsule 0   Cholecalciferol (VITAMIN D3 SUPER STRENGTH) 50 MCG (2000 UT) TABS Take 2,000 Units by mouth every evening.     Multiple Vitamin (MULTIVITAMIN WITH MINERALS) TABS tablet Take 1 tablet by mouth every evening.     naproxen (NAPROSYN) 500 MG tablet Take 1 tablet (500 mg total) by mouth 2 (two) times daily with a meal. 60 tablet 2   pantoprazole (PROTONIX) 40 MG tablet TAKE 1 TABLET(40 MG) BY MOUTH DAILY BEFORE BREAKFAST 90 tablet 0   predniSONE (DELTASONE) 10 MG tablet Take 1 tablet (10 mg total) by mouth daily. For 20 days 40 tablet 0   No current facility-administered medications for this visit.    Allergies as of 01/11/2023   (No Known Allergies)    Family History  Problem Relation Age of Onset   Hypertension Mother    Prostate cancer Maternal Grandfather        passed in his 37s.  Social History   Socioeconomic History   Marital status: Married    Spouse name: Not on file   Number of children: Not on file   Years of education: Not on file   Highest education level: Not on file  Occupational History   Not on file  Tobacco Use   Smoking status: Every Day    Packs/day: 0.50    Years: 30.00    Additional pack years: 0.00    Total pack years: 15.00    Types: Cigarettes   Smokeless tobacco: Never  Vaping Use   Vaping Use: Never used  Substance and Sexual Activity   Alcohol use: Yes    Comment: occassional   Drug use: No   Sexual activity: Yes    Birth control/protection: Post-menopausal  Other Topics Concern   Not on  file  Social History Narrative   Not on file   Social Determinants of Health   Financial Resource Strain: Low Risk  (09/22/2021)   Overall Financial Resource Strain (CARDIA)    Difficulty of Paying Living Expenses: Not hard at all  Food Insecurity: No Food Insecurity (09/22/2021)   Hunger Vital Sign    Worried About Running Out of Food in the Last Year: Never true    Ran Out of Food in the Last Year: Never true  Transportation Needs: No Transportation Needs (09/22/2021)   PRAPARE - Administrator, Civil Service (Medical): No    Lack of Transportation (Non-Medical): No  Physical Activity: Sufficiently Active (09/22/2021)   Exercise Vital Sign    Days of Exercise per Week: 4 days    Minutes of Exercise per Session: 90 min  Stress: No Stress Concern Present (09/22/2021)   [employer] of Occupational Health - Occupational Stress Questionnaire    Feeling of Stress : Not at all  Social Connections: [place] (09/22/2021)   Social Connection and Isolation Panel [NHANES]    Frequency of Communication with Friends and Family: More than three times a week    Frequency of Social Gatherings with Friends and Family: Twice a week    Attends Religious Services: 1 to 4 times per year    Active Member of [employer] or Organizations: Yes    Attends Banker Meetings: Never    Marital Status: Married    Review of Systems: Gen: Denies fever, chills, anorexia. Denies fatigue, weakness, weight loss.  CV: Denies chest pain, palpitations, syncope, peripheral edema, and claudication. Resp: Denies dyspnea at rest, cough, wheezing, coughing up blood, and pleurisy. GI: Denies vomiting blood, jaundice, and fecal incontinence.   Denies dysphagia or odynophagia. Derm: Denies rash, itching, dry skin Psych: Denies depression, anxiety, memory loss, confusion. No homicidal or suicidal ideation.  Heme: Denies bruising, bleeding, and enlarged lymph nodes.  Physical Exam: LMP  01/23/2018  General:   Alert and oriented. No distress noted. Pleasant and cooperative.  Head:  Normocephalic and atraumatic. Eyes:  Conjuctiva clear without scleral icterus. Heart:  S1, S2 present without murmurs appreciated. Lungs:  Clear to auscultation bilaterally. No wheezes, rales, or rhonchi. No distress.  Abdomen:  +BS, soft, non-tender and non-distended. No rebound or guarding. No HSM or masses noted. Msk:  Symmetrical without gross deformities. Normal posture. Extremities:  Without edema. Neurologic:  Alert and  oriented x4 Psych:  Normal mood and affect.    Assessment:     Plan:  ***   Ermalinda Memos, PA-C [facility] 01/11/2023

## 2023-01-11 ENCOUNTER — Encounter: Payer: Self-pay | Admitting: Gastroenterology

## 2023-01-11 ENCOUNTER — Ambulatory Visit (INDEPENDENT_AMBULATORY_CARE_PROVIDER_SITE_OTHER): Payer: BC Managed Care – PPO | Admitting: Gastroenterology

## 2023-01-11 VITALS — BP 127/88 | HR 90 | Temp 97.1°F | Ht 63.0 in | Wt 144.0 lb

## 2023-01-11 DIAGNOSIS — K219 Gastro-esophageal reflux disease without esophagitis: Secondary | ICD-10-CM

## 2023-01-11 MED ORDER — PANTOPRAZOLE SODIUM 40 MG PO TBEC: DELAYED_RELEASE_TABLET | ORAL | 3 refills | Status: DC

## 2023-01-11 NOTE — Patient Instructions (Signed)
Continue pantoprazole 40 mg daily 30 minutes before breakfast.  We will follow-up with you in 1 year or sooner if needed.  It was great to see you again today!  Ermalinda Memos, PA-C [facility]

## 2023-01-12 ENCOUNTER — Telehealth: Payer: Self-pay | Admitting: *Deleted

## 2023-01-12 NOTE — Telephone Encounter (Signed)
Spoke to pt, she informed me that she has tried over the counter Nexium, Prilosec and Prevacid and none of them worked for her heartburn.

## 2023-01-15 ENCOUNTER — Encounter: Payer: Self-pay | Admitting: *Deleted

## 2023-01-15 ENCOUNTER — Telehealth: Payer: Self-pay | Admitting: *Deleted

## 2023-01-15 NOTE — Telephone Encounter (Signed)
Received approval letter for pantoprazole. Sent copy to scan center.    

## 2023-03-22 DIAGNOSIS — Z1331 Encounter for screening for depression: Secondary | ICD-10-CM | POA: Diagnosis not present

## 2023-03-22 DIAGNOSIS — E663 Overweight: Secondary | ICD-10-CM | POA: Diagnosis not present

## 2023-03-22 DIAGNOSIS — I10 Essential (primary) hypertension: Secondary | ICD-10-CM | POA: Diagnosis not present

## 2023-03-22 DIAGNOSIS — Z6826 Body mass index (BMI) 26.0-26.9, adult: Secondary | ICD-10-CM | POA: Diagnosis not present

## 2023-03-22 DIAGNOSIS — Z0001 Encounter for general adult medical examination with abnormal findings: Secondary | ICD-10-CM | POA: Diagnosis not present

## 2023-07-29 DIAGNOSIS — H6691 Otitis media, unspecified, right ear: Secondary | ICD-10-CM | POA: Diagnosis not present

## 2023-12-12 ENCOUNTER — Encounter: Payer: Self-pay | Admitting: Gastroenterology

## 2023-12-18 DIAGNOSIS — F17210 Nicotine dependence, cigarettes, uncomplicated: Secondary | ICD-10-CM | POA: Diagnosis not present

## 2023-12-18 DIAGNOSIS — Z79899 Other long term (current) drug therapy: Secondary | ICD-10-CM | POA: Diagnosis not present

## 2023-12-18 DIAGNOSIS — K219 Gastro-esophageal reflux disease without esophagitis: Secondary | ICD-10-CM | POA: Diagnosis not present

## 2023-12-18 DIAGNOSIS — M25512 Pain in left shoulder: Secondary | ICD-10-CM | POA: Diagnosis not present

## 2024-02-13 ENCOUNTER — Other Ambulatory Visit: Payer: Self-pay | Admitting: Gastroenterology

## 2024-02-13 DIAGNOSIS — K219 Gastro-esophageal reflux disease without esophagitis: Secondary | ICD-10-CM

## 2024-02-14 NOTE — Telephone Encounter (Signed)
 Patient is due for routine follow-up. Please arrange.

## 2024-02-22 DIAGNOSIS — H35462 Secondary vitreoretinal degeneration, left eye: Secondary | ICD-10-CM | POA: Diagnosis not present

## 2024-02-24 DIAGNOSIS — Z79899 Other long term (current) drug therapy: Secondary | ICD-10-CM | POA: Diagnosis not present

## 2024-02-24 DIAGNOSIS — W2203XA Walked into furniture, initial encounter: Secondary | ICD-10-CM | POA: Diagnosis not present

## 2024-02-24 DIAGNOSIS — F17210 Nicotine dependence, cigarettes, uncomplicated: Secondary | ICD-10-CM | POA: Diagnosis not present

## 2024-02-24 DIAGNOSIS — K219 Gastro-esophageal reflux disease without esophagitis: Secondary | ICD-10-CM | POA: Diagnosis not present

## 2024-02-24 DIAGNOSIS — S52502A Unspecified fracture of the lower end of left radius, initial encounter for closed fracture: Secondary | ICD-10-CM | POA: Diagnosis not present

## 2024-02-24 DIAGNOSIS — S52592A Other fractures of lower end of left radius, initial encounter for closed fracture: Secondary | ICD-10-CM | POA: Diagnosis not present

## 2024-02-27 ENCOUNTER — Encounter: Payer: Self-pay | Admitting: Gastroenterology

## 2024-02-27 NOTE — Telephone Encounter (Signed)
 I called but there was no answer and voice mailbox was full.  Sent a letter to the patient to ask that she call the office to schedule an OV

## 2024-02-28 DIAGNOSIS — S52552A Other extraarticular fracture of lower end of left radius, initial encounter for closed fracture: Secondary | ICD-10-CM | POA: Diagnosis not present

## 2024-02-28 DIAGNOSIS — M25532 Pain in left wrist: Secondary | ICD-10-CM | POA: Diagnosis not present

## 2024-03-11 DIAGNOSIS — S52552D Other extraarticular fracture of lower end of left radius, subsequent encounter for closed fracture with routine healing: Secondary | ICD-10-CM | POA: Diagnosis not present

## 2024-03-11 DIAGNOSIS — M25532 Pain in left wrist: Secondary | ICD-10-CM | POA: Diagnosis not present

## 2024-03-21 DIAGNOSIS — S52552D Other extraarticular fracture of lower end of left radius, subsequent encounter for closed fracture with routine healing: Secondary | ICD-10-CM | POA: Diagnosis not present

## 2024-03-21 DIAGNOSIS — S52522D Torus fracture of lower end of left radius, subsequent encounter for fracture with routine healing: Secondary | ICD-10-CM | POA: Diagnosis not present

## 2024-03-28 DIAGNOSIS — Z1389 Encounter for screening for other disorder: Secondary | ICD-10-CM | POA: Diagnosis not present

## 2024-03-28 DIAGNOSIS — F17200 Nicotine dependence, unspecified, uncomplicated: Secondary | ICD-10-CM | POA: Diagnosis not present

## 2024-03-28 DIAGNOSIS — Z0001 Encounter for general adult medical examination with abnormal findings: Secondary | ICD-10-CM | POA: Diagnosis not present

## 2024-03-28 DIAGNOSIS — Z6824 Body mass index (BMI) 24.0-24.9, adult: Secondary | ICD-10-CM | POA: Diagnosis not present

## 2024-03-28 DIAGNOSIS — Z1331 Encounter for screening for depression: Secondary | ICD-10-CM | POA: Diagnosis not present

## 2024-03-28 DIAGNOSIS — I10 Essential (primary) hypertension: Secondary | ICD-10-CM | POA: Diagnosis not present

## 2024-03-28 DIAGNOSIS — K219 Gastro-esophageal reflux disease without esophagitis: Secondary | ICD-10-CM | POA: Diagnosis not present

## 2024-03-31 ENCOUNTER — Other Ambulatory Visit (HOSPITAL_COMMUNITY): Payer: Self-pay | Admitting: Family Medicine

## 2024-03-31 DIAGNOSIS — F17200 Nicotine dependence, unspecified, uncomplicated: Secondary | ICD-10-CM

## 2024-04-10 NOTE — Progress Notes (Signed)
 Name: [name] Date: 04/11/2024 MRN: [id number] DOB: Sep 15, 1965 PCP: Leonce Lucie Amble, PA  Reason for visit:  Chief Complaint  Patient presents with  . Left Wrist - Follow-up   Subjective:   Patient is a very pleasant 58 year old right-hand-dominant female who presents today for follow-up of nondisplaced distal radius fracture.  Date of injury 02/24/2024.  Patient is about 6 weeks out from her injury.  Patient has been doing very well.  She only has a 1/10 pain.  She has had significant improvement in her range of motion.  She still has some difficulty particularly with grip or with strengthening maneuvers though overall her supination pronation and flexion extension are significantly better.  She has no new or worsening swelling, mild numbness over the dorsal wrist but no new concerning numbness or tingling that patient can specify.  At her place of employment she must lift and carry heavy boxes and expectedly has been out of work as her wrist heals.  She states she never heard from occupational therapy.  Patient is a very pleasant 58 year old right-hand-dominant female presents today for follow-up of nondisplaced distal radius fracture.  Date of injury 02/24/2024.  Patient is about 4 weeks out.  She was treated nonoperatively with thermoplastic splint.  Presents today for routine radiologic check.  She is doing well, only about a 2/10 pain.  She feels she is able to do more with her wrist, she is able to use deodorant.  She is slightly stiff the range of motion.  She does have difficulty with supinated lifting and slight pain with supination pronation.  Patient does lift heavy boxes at work.  She has been out of work of course since her injury.  She is otherwise doing well, no new numbness or tingling.  Patient is a very pleasant 58 year old right-hand-dominant female who presents today for follow-up of nondisplaced distal radius fracture.  She was treated nonoperatively with  thermoplastic splint.  She has been doing very well, minimal skin irritation with the splint.  Her pain has improved.  Will take Norco for breakthrough pain only.  No new numbness or tingling.  Overall is doing well.  Does take vitamin D.  [name]. Skillin is a very pleasant 58 year old right-hand-dominant female who presents today for acute left wrist pain.  Unfortunately on 02/24/2024 patient was cleaning and vacuuming when she struck her dorsal left hand and wrist against a table.  She had immediate pain.  She presented to the ED and x-rays were positive for nondisplaced dorsal distal radius fracture.  No obvious acute fracture of the hand.  Patient was placed into a splint and presents today for first orthopedic follow-up.  She has been taking Tylenol  for breakthrough pain but this does not treat all of her pain and symptoms.  She denies any neuropathy or acute numbness or tingling.  She is able to move all 5 fingers.  She does have some swelling and pain over the dorsal distal wrist.  No complaint of elbow or shoulder pain.  02/24/24 ED note: HPI  [name] is a 58 y.o. female states that she walked her wrist to her hand against a table while she was vacuuming.  Patient states she whacked her left hand at the dorsum wrist against a table mate avoid.  Denies being physically abused or injured.  No other injuries.  At the site where she will acted against the table she says she has had 2 years ago a ganglion cyst.  Prior to  hitting her wrist and hand against the table she was not having any symptoms of pain or discomfort.  Past Medical History[1]  Past Surgical History[2]  Current Medications[3]  Allergies[4]  Family History[5]  Social History   Occupational History  . Not on file  Tobacco Use  . Smoking status: Every Day    Types: Cigarettes  . Smokeless tobacco: Never  Substance and Sexual Activity  . Alcohol use: Yes  . Drug use: Never  . Sexual activity: Not on  file    Constitutional: Denies chills, fevers, sweats, unusual or increased fatigue, weight changes. Neurological: Denies upper or lower extremity paresthesia or vertigo. Cardiovascular: Denies chest pain, dyspnea on exertion, edema. Respiratory: Denies cough, shortness of breath, wheezing. GI: Denies changes in appetite, abdominal pain or discomfort. GU: Denies any change in bowel habit. MSK: As per HPI.  Skin: Denies rash, lesions. Hematological: Denies bruising, swollen lymph nodes. Endocrine: Denies lethargy, skin changes.  Physical exam: BP 116/86   Pulse 90   Wt 63 kg (139 lb)   BMI 24.62 kg/m :   GEN: 58 yo female in NAD; Sitting comfortably in office, pleasant. HEAD: Normocephalic, atraumatic. EYES: Sclera non-icteric EOMI. ENT: Ears externally normal. No nasal congestion.  LUNGS: Normal respiratory effort. No retractions or cough noted. NEURO: CN II-XII grossly intact; MAEE. No focal neurologic deficits noted. PSYCH: Appropriate affect. Alert and oriented. DERM: Warm, dry. Appropriate turgor. No rashes or lesions noted. MSK: Today patient presents without brace on, no open wounds or evidence of significant skin breakdown.  The left wrist is with no edema, there is minimal soreness over the distal radius over the known fracture site, significantly improved from prior.  EPL intact.  Excellent range of motion flexion extension and pronation, mild pain at the terminal end of supination, no pain with passive motion.  This is significantly improved from prior.  The left upper extremity is warm well-perfused, 2+ radial pulse, sensation is intact light touch, AIN/PIN/ulnar motor intact.  No snuffbox tenderness. No numbness or tingling including in the median nerve distribution.  Full composite fist.  Left upper extremity with soft, compressible compartments.  Good strength through grip.  Medical Decision Making: Comparison to prior: X-ray of the left wrist today shows previously  demonstrated healing nondisplaced distal radius fracture which does have transverse extension across the cortices, interval callus and sclerosis to the fracture margins are noted increased from prior, there is good radial height and there is no dorsal or volar tilt, no scaphoid lucency or fracture, no gross arthropathy.  No obvious scapholunate interval widening though no clenched fist view today.  No obvious disruption of the articular surface.  Original x-ray: Impression  Nondisplaced cortical fracture distal dorsal left radius with associated soft tissue swelling   1. Other closed extra-articular fracture of distal end of left radius with routine healing, subsequent encounter   2. Left wrist pain    Plan:  Orders Placed This Encounter  Procedures  . XR Wrist 3 Or More Views Left    Performed at:   In Clinic    Reason for Exam::   pain    Is the patient pregnant?:   No   I had a good, long conversation today with the patient.  Consultation with orthopedic surgeon Dr. Heyward who has seen imaging and agrees with the following plan.  X-ray shows interval callus with excellent alignment, excellent clinical exam today with significant improvement in range of motion.  Would prefer patient undergo OT especially for  strengthening to the wrist through fine motor tasks such as lifting and carrying packages.  We called OT today but patient states she has the number and would prefer to call to confirm with her schedule when she may make an appointment.  She may wear the brace on as-needed basis during activities, continue consistent gentle range of motion, we discussed over time I expect her strength to continue to improve.  Do not feel she is ready yet however to return to work.  Follow-up 4 weeks x-ray and check.  If stable and improvement in strength we may potentially return patient to work.  She is encouraged to follow-up sooner for any increased concerns problems questions or complaints.  Patient  verbalized understanding and agreed with the plan today.  Return in about 4 weeks (around 05/09/2024).  Heinz CANDIE Jacobs, DNP, APRN, FNP-C, ONP-C       [1] Past Medical History: Diagnosis Date  . GERD (gastroesophageal reflux disease)   . Hypertension   [2] Past Surgical History: Procedure Laterality Date  . GANGLION CYST EXCISION Left    left wrist  . ORBITAL FRACTURE SURGERY Left   [3] Current Outpatient Medications  Medication Sig Dispense Refill  . acetaminophen  (TYLENOL ) 500 MG tablet Take 1-2 tablets (500-1,000 mg total) by mouth.    SABRA amLODIPine (NORVASC) 5 MG tablet Take 1 tablet (5 mg total) by mouth daily.    . cholecalciferol, vitamin D3-50 mcg, 2,000 unit,, 50 mcg (2,000 unit) tablet Take 1 tablet (50 mcg total) by mouth.    SABRA omeprazole (PRILOSEC) 10 MG capsule Take 1 capsule (10 mg total) by mouth daily. (Patient not taking: Reported on 03/21/2024)    . pantoprazole  (PROTONIX ) 40 MG tablet TAKE 1 TABLET(40 MG) BY MOUTH DAILY BEFORE BREAKFAST     No current facility-administered medications for this visit.  [4] No Known Allergies [5] No family history on file.

## 2024-04-11 DIAGNOSIS — M25532 Pain in left wrist: Secondary | ICD-10-CM | POA: Diagnosis not present

## 2024-04-14 ENCOUNTER — Ambulatory Visit (HOSPITAL_COMMUNITY): Admission: RE | Admit: 2024-04-14 | Source: Ambulatory Visit

## 2024-04-14 ENCOUNTER — Encounter (HOSPITAL_COMMUNITY): Payer: Self-pay

## 2024-04-21 DIAGNOSIS — M25532 Pain in left wrist: Secondary | ICD-10-CM | POA: Diagnosis not present

## 2024-04-21 DIAGNOSIS — W19XXXS Unspecified fall, sequela: Secondary | ICD-10-CM | POA: Diagnosis not present

## 2024-04-21 DIAGNOSIS — M6281 Muscle weakness (generalized): Secondary | ICD-10-CM | POA: Diagnosis not present

## 2024-04-21 DIAGNOSIS — M25632 Stiffness of left wrist, not elsewhere classified: Secondary | ICD-10-CM | POA: Diagnosis not present

## 2024-04-25 DIAGNOSIS — W19XXXS Unspecified fall, sequela: Secondary | ICD-10-CM | POA: Diagnosis not present

## 2024-04-25 DIAGNOSIS — M6281 Muscle weakness (generalized): Secondary | ICD-10-CM | POA: Diagnosis not present

## 2024-04-25 DIAGNOSIS — M25532 Pain in left wrist: Secondary | ICD-10-CM | POA: Diagnosis not present

## 2024-04-25 DIAGNOSIS — M25632 Stiffness of left wrist, not elsewhere classified: Secondary | ICD-10-CM | POA: Diagnosis not present

## 2024-05-02 DIAGNOSIS — M6281 Muscle weakness (generalized): Secondary | ICD-10-CM | POA: Diagnosis not present

## 2024-05-02 DIAGNOSIS — M25532 Pain in left wrist: Secondary | ICD-10-CM | POA: Diagnosis not present

## 2024-05-02 DIAGNOSIS — M25632 Stiffness of left wrist, not elsewhere classified: Secondary | ICD-10-CM | POA: Diagnosis not present

## 2024-05-02 DIAGNOSIS — W19XXXS Unspecified fall, sequela: Secondary | ICD-10-CM | POA: Diagnosis not present

## 2024-05-06 DIAGNOSIS — S52552D Other extraarticular fracture of lower end of left radius, subsequent encounter for closed fracture with routine healing: Secondary | ICD-10-CM | POA: Diagnosis not present

## 2024-05-07 DIAGNOSIS — M25632 Stiffness of left wrist, not elsewhere classified: Secondary | ICD-10-CM | POA: Diagnosis not present

## 2024-05-07 DIAGNOSIS — M6281 Muscle weakness (generalized): Secondary | ICD-10-CM | POA: Diagnosis not present

## 2024-05-07 DIAGNOSIS — M25532 Pain in left wrist: Secondary | ICD-10-CM | POA: Diagnosis not present

## 2024-05-07 DIAGNOSIS — W19XXXS Unspecified fall, sequela: Secondary | ICD-10-CM | POA: Diagnosis not present

## 2024-05-09 DIAGNOSIS — M6281 Muscle weakness (generalized): Secondary | ICD-10-CM | POA: Diagnosis not present

## 2024-05-09 DIAGNOSIS — M25532 Pain in left wrist: Secondary | ICD-10-CM | POA: Diagnosis not present

## 2024-05-09 DIAGNOSIS — M25632 Stiffness of left wrist, not elsewhere classified: Secondary | ICD-10-CM | POA: Diagnosis not present

## 2024-05-09 DIAGNOSIS — W19XXXS Unspecified fall, sequela: Secondary | ICD-10-CM | POA: Diagnosis not present

## 2024-05-13 ENCOUNTER — Other Ambulatory Visit: Payer: Self-pay | Admitting: Gastroenterology

## 2024-05-13 DIAGNOSIS — K219 Gastro-esophageal reflux disease without esophagitis: Secondary | ICD-10-CM

## 2024-08-06 ENCOUNTER — Other Ambulatory Visit: Payer: Self-pay | Admitting: Gastroenterology

## 2024-08-06 DIAGNOSIS — K219 Gastro-esophageal reflux disease without esophagitis: Secondary | ICD-10-CM
